# Patient Record
Sex: Female | Born: 1965 | Race: White | Hispanic: Yes | Smoking: Never smoker
Health system: Southern US, Community
[De-identification: ages and names within clinical notes are randomized; demographics above are authoritative.]

## PROBLEM LIST (undated history)

## (undated) DIAGNOSIS — R51 Headache: Secondary | ICD-10-CM

## (undated) DIAGNOSIS — T8859XA Other complications of anesthesia, initial encounter: Secondary | ICD-10-CM

## (undated) DIAGNOSIS — E7800 Pure hypercholesterolemia, unspecified: Secondary | ICD-10-CM

## (undated) DIAGNOSIS — T4145XA Adverse effect of unspecified anesthetic, initial encounter: Secondary | ICD-10-CM

## (undated) DIAGNOSIS — G20A1 Parkinson's disease without dyskinesia, without mention of fluctuations: Secondary | ICD-10-CM

## (undated) DIAGNOSIS — R519 Headache, unspecified: Secondary | ICD-10-CM

## (undated) HISTORY — DX: Headache: R51

## (undated) HISTORY — DX: Headache, unspecified: R51.9

## (undated) HISTORY — DX: Other complications of anesthesia, initial encounter: T88.59XA

---
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
## 1999-01-24 ENCOUNTER — Encounter: Payer: Self-pay | Admitting: *Deleted

## 1999-01-24 ENCOUNTER — Ambulatory Visit (HOSPITAL_COMMUNITY): Admission: RE | Admit: 1999-01-24 | Discharge: 1999-01-24 | Payer: Self-pay | Admitting: *Deleted

## 1999-04-26 ENCOUNTER — Inpatient Hospital Stay (HOSPITAL_COMMUNITY): Admission: AD | Admit: 1999-04-26 | Discharge: 1999-04-26 | Payer: Self-pay | Admitting: *Deleted

## 1999-04-26 ENCOUNTER — Encounter: Payer: Self-pay | Admitting: *Deleted

## 1999-04-29 ENCOUNTER — Inpatient Hospital Stay (HOSPITAL_COMMUNITY): Admission: AD | Admit: 1999-04-29 | Discharge: 1999-04-29 | Payer: Self-pay | Admitting: *Deleted

## 1999-04-30 ENCOUNTER — Encounter (INDEPENDENT_AMBULATORY_CARE_PROVIDER_SITE_OTHER): Payer: Self-pay

## 1999-04-30 ENCOUNTER — Inpatient Hospital Stay (HOSPITAL_COMMUNITY): Admission: AD | Admit: 1999-04-30 | Discharge: 1999-05-03 | Payer: Self-pay | Admitting: Obstetrics & Gynecology

## 1999-05-05 ENCOUNTER — Inpatient Hospital Stay (HOSPITAL_COMMUNITY): Admission: AD | Admit: 1999-05-05 | Discharge: 1999-05-05 | Payer: Self-pay | Admitting: Obstetrics & Gynecology

## 1999-05-08 ENCOUNTER — Inpatient Hospital Stay (HOSPITAL_COMMUNITY): Admission: AD | Admit: 1999-05-08 | Discharge: 1999-05-08 | Payer: Self-pay | Admitting: *Deleted

## 1999-05-09 ENCOUNTER — Inpatient Hospital Stay (HOSPITAL_COMMUNITY): Admission: AD | Admit: 1999-05-09 | Discharge: 1999-05-09 | Payer: Self-pay | Admitting: Obstetrics and Gynecology

## 1999-05-11 ENCOUNTER — Inpatient Hospital Stay (HOSPITAL_COMMUNITY): Admission: AD | Admit: 1999-05-11 | Discharge: 1999-05-11 | Payer: Self-pay | Admitting: *Deleted

## 1999-05-14 ENCOUNTER — Inpatient Hospital Stay (HOSPITAL_COMMUNITY): Admission: AD | Admit: 1999-05-14 | Discharge: 1999-05-14 | Payer: Self-pay | Admitting: Obstetrics & Gynecology

## 2002-10-14 ENCOUNTER — Ambulatory Visit (HOSPITAL_COMMUNITY): Admission: RE | Admit: 2002-10-14 | Discharge: 2002-10-14 | Payer: Self-pay | Admitting: Family Medicine

## 2008-05-13 DIAGNOSIS — E7800 Pure hypercholesterolemia, unspecified: Secondary | ICD-10-CM | POA: Insufficient documentation

## 2009-03-13 LAB — CONVERTED CEMR LAB: Pap Smear: NORMAL

## 2009-04-12 ENCOUNTER — Ambulatory Visit (HOSPITAL_COMMUNITY): Admission: RE | Admit: 2009-04-12 | Discharge: 2009-04-12 | Payer: Self-pay | Admitting: Obstetrics & Gynecology

## 2010-02-15 ENCOUNTER — Observation Stay (HOSPITAL_COMMUNITY): Admission: EM | Admit: 2010-02-15 | Discharge: 2010-02-17 | Payer: Self-pay | Admitting: Emergency Medicine

## 2010-02-15 ENCOUNTER — Ambulatory Visit: Payer: Self-pay | Admitting: Cardiology

## 2010-02-15 DIAGNOSIS — R079 Chest pain, unspecified: Secondary | ICD-10-CM | POA: Insufficient documentation

## 2010-02-16 LAB — CONVERTED CEMR LAB
ALT: 15 units/L
AST: 18 units/L
Albumin: 3.4 g/dL
CO2: 24 meq/L
Calcium: 8.7 mg/dL
Creatinine, Ser: 0.56 mg/dL
Sodium: 136 meq/L
Total Protein: 6.3 g/dL

## 2010-02-17 LAB — CONVERTED CEMR LAB
MCHC: 33.3 g/dL
MCV: 93 fL
Platelets: 257 10*3/uL
RDW: 13.4 %

## 2010-02-27 ENCOUNTER — Ambulatory Visit: Payer: Self-pay | Admitting: Nurse Practitioner

## 2010-02-27 DIAGNOSIS — R42 Dizziness and giddiness: Secondary | ICD-10-CM

## 2010-02-27 DIAGNOSIS — F411 Generalized anxiety disorder: Secondary | ICD-10-CM | POA: Insufficient documentation

## 2010-02-27 DIAGNOSIS — M25539 Pain in unspecified wrist: Secondary | ICD-10-CM | POA: Insufficient documentation

## 2010-02-27 DIAGNOSIS — K219 Gastro-esophageal reflux disease without esophagitis: Secondary | ICD-10-CM

## 2010-03-21 ENCOUNTER — Ambulatory Visit: Payer: Self-pay | Admitting: Nurse Practitioner

## 2010-03-21 DIAGNOSIS — R519 Headache, unspecified: Secondary | ICD-10-CM | POA: Insufficient documentation

## 2010-03-21 DIAGNOSIS — R51 Headache: Secondary | ICD-10-CM

## 2010-03-21 LAB — CONVERTED CEMR LAB
Cholesterol, target level: 200 mg/dL
LDL Goal: 160 mg/dL

## 2010-03-27 ENCOUNTER — Ambulatory Visit: Payer: Self-pay | Admitting: Nurse Practitioner

## 2010-03-27 LAB — CONVERTED CEMR LAB
ALT: 20 units/L (ref 0–35)
AST: 19 units/L (ref 0–37)
Albumin: 4.3 g/dL (ref 3.5–5.2)
Alkaline Phosphatase: 43 units/L (ref 39–117)
BUN: 9 mg/dL (ref 6–23)
Basophils Absolute: 0 10*3/uL (ref 0.0–0.1)
Basophils Relative: 0 % (ref 0–1)
Calcium: 9.2 mg/dL (ref 8.4–10.5)
Chloride: 103 meq/L (ref 96–112)
Creatinine, Ser: 0.55 mg/dL (ref 0.40–1.20)
Eosinophils Absolute: 0.2 10*3/uL (ref 0.0–0.7)
HDL: 59 mg/dL (ref >39)
LDL Cholesterol: 131 mg/dL — ABNORMAL HIGH (ref 0–99)
MCHC: 33.6 g/dL (ref 30.0–36.0)
MCV: 91.4 fL (ref 78.0–100.0)
Monocytes Relative: 6 % (ref 3–12)
Neutro Abs: 4.2 10*3/uL (ref 1.7–7.7)
Neutrophils Relative %: 54 % (ref 43–77)
Potassium: 4.1 meq/L (ref 3.5–5.3)
RDW: 13.8 % (ref 11.5–15.5)
TSH: 1.133 microintl units/mL (ref 0.350–4.500)
Total CHOL/HDL Ratio: 3.5

## 2010-03-28 ENCOUNTER — Encounter (INDEPENDENT_AMBULATORY_CARE_PROVIDER_SITE_OTHER): Payer: Self-pay | Admitting: Nurse Practitioner

## 2010-05-30 ENCOUNTER — Other Ambulatory Visit: Payer: Self-pay | Admitting: Nurse Practitioner

## 2010-05-30 ENCOUNTER — Encounter (INDEPENDENT_AMBULATORY_CARE_PROVIDER_SITE_OTHER): Payer: Self-pay | Admitting: Nurse Practitioner

## 2010-05-30 ENCOUNTER — Ambulatory Visit
Admission: RE | Admit: 2010-05-30 | Discharge: 2010-05-30 | Payer: Self-pay | Source: Home / Self Care | Attending: Nurse Practitioner | Admitting: Nurse Practitioner

## 2010-05-30 LAB — CONVERTED CEMR LAB
Blood in Urine, dipstick: NEGATIVE
GC Probe Amp, Genital: NEGATIVE
Glucose, Urine, Semiquant: NEGATIVE
Nitrite: NEGATIVE
Protein, U semiquant: NEGATIVE
Urobilinogen, UA: 0.2
WBC Urine, dipstick: NEGATIVE

## 2010-05-31 ENCOUNTER — Encounter (INDEPENDENT_AMBULATORY_CARE_PROVIDER_SITE_OTHER): Payer: Self-pay | Admitting: Nurse Practitioner

## 2010-06-01 ENCOUNTER — Ambulatory Visit (HOSPITAL_COMMUNITY)
Admission: RE | Admit: 2010-06-01 | Discharge: 2010-06-01 | Payer: Self-pay | Source: Home / Self Care | Attending: Internal Medicine | Admitting: Internal Medicine

## 2010-06-12 NOTE — Assessment & Plan Note (Signed)
Summary: NEW - Hospital F/u   Vital Signs:  Patient profile:   45 year old female LMP:     01/31/2010 Height:      59 inches Weight:      139.7 pounds BMI:     28.32 Temp:     97.4 degrees F oral Pulse rate:   64 / minute Pulse rhythm:   regular Resp:     16 per minute BP sitting:   106 / 76  (left arm) Cuff size:   regular  Vitals Entered By: Levon Hedger (February 27, 2010 9:38 AM)  Nutrition Counseling: Patient's BMI is greater than 25 and therefore counseled on weight management options. CC: hospital follow up [facility], Abdominal Pain Is Patient Diabetic? No Pain Assessment Patient in pain? no       Does patient need assistance? Functional Status Self care Ambulation Normal LMP (date): 01/31/2010     Enter LMP: 01/31/2010 Last PAP Result normal per pt - done at [facility]   CC:  hospital follow up [facility] and Abdominal Pain.  History of Present Illness:  Pt into the office to establish care. No previous PCP.  Pt was seen at a clinic about 1 year ago at which time she was dx with hypercholesterolemia. She was started on fish oil and niacin of which she is still taking.  She purchases the medications over the counter.  CPE - last PAP was 03/2009 done at [facility]  Hospitalized from on 02/15/2010 with chest pain.  Pain started while she was on the stairmaster at the gym.  Pain had been present intermittently for the past 5-6 months but worsened on the day of presentation.   Pt started going to the gym about 2 months.  She had done the stairmaster on the day prior but only for 1 minutes.  On the day of presentation she wanted to do more and after 2 minutes is when she felt the chest pain.  Dizziness and weakness started EKG was not suggestive of ischemia Cardiac enzymes negative Pt was started on heparin, aspirin and satin Lexiscan Myoview done in the hospital -  No inducible ischemia with exercise stress   71% ejection fraction   Hyperlipidemia - lipids 121. Pt  was advised to follow diet, exercise and TLC diet  Acid reflux -tobacco -ETOH +spicy foods +tomato based   [employer] used for interpreter  Dyspepsia History:      She has no alarm features of dyspepsia including no history of melena, hematochezia, dysphagia, persistent vomiting, or involuntary weight loss > 5%.  The patient does not have a prior history of documented ulcer disease.  The dominant symptom is heartburn or acid reflux.     Habits & Providers  Alcohol-Tobacco-Diet     Alcohol drinks/day: 0     Tobacco Status: never  Exercise-Depression-Behavior     Drug Use: never  Medications Prior to Update: 1)  None  Current Medications (verified): 1)  Fish Oil 1000 Mg Caps (Omega-3 Fatty Acids) .... Take One Tablet By Mouth Daily  Allergies (verified): No Known Drug Allergies  Past History:  Past Surgical History: Caesarean section x 3  Family History:  mother - noncontributory father - noncontributory  Social History: married 3 children tobacco - none ETOH - none Drug - noneSmoking Status:  never Drug Use:  never  Review of Systems General:  Denies fever; +Dizziness. Eyes:  Complains of blurring; worsening vision for the past year. CV:  Complains of chest pain or  discomfort; when she goes to the gym and does sit ups or walks on stairmaster. Resp:  Denies cough. GI:  Complains of indigestion; denies abdominal pain, nausea, and vomiting. Neuro:  Complains of numbness; right wrist.  Physical Exam  General:  alert.   Head:  normocephalic.   Lungs:  normal breath sounds.   Heart:  normal rate and regular rhythm.   Abdomen:  normal bowel sounds.   Msk:  normal ROM.   Neurologic:  alert & oriented X3.   Skin:  color normal.   Psych:  Oriented X3.     Impression & Recommendations:  Problem # 1:  Hosp for CHEST PAIN (ICD-786.50) likely muscle advise pt that she needs  Problem # 2:  DIZZINESS (ICD-780.4) advise pt that she need to drink  plenty of water cbc done in hospital ok  Problem # 3:  HYPERCHOLESTEROLEMIA (ICD-272.0) will check on next visit  Problem # 4:  WRIST PAIN, RIGHT (ICD-719.43)  Problem # 5:  ACID REFLUX DISEASE (ICD-530.81)  handout given on foods to avoid  Her updated medication list for this problem includes:    Nexium 40 Mg Cpdr (Esomeprazole magnesium) ..... One capsule by mouth daily before breakfast  Problem # 6:  NEED PROPHYLACTIC VACCINATION&INOCULATION FLU (ICD-V04.81) given today  Complete Medication List: 1)  Fish Oil 1000 Mg Caps (Omega-3 fatty acids) .... Take one tablet by mouth daily 2)  Nexium 40 Mg Cpdr (Esomeprazole magnesium) .... One capsule by mouth daily before breakfast  Patient Instructions: 1)  Schedule an appointment for retasure at [facility] 2)  This will check your eye for cataracts or glaucoma 3)  If this is ok then you will likely need to get a basic eye exam 4)  Follow up with n.martin,fnp in 2 weeks for chest pain. 5)  Acid reflux - read handout to see what foods to avoid 6)  Take nexium 40mg  by mouth daily before breakfast (samples given) 7)  Chest pain - not cardiac 8)  may be either muscle or due to acid reflux 9)  Right wrist - likely due to over use of wrist especially since you are right handed.  Avoid repeative activities with your right hand.  Avoid sleeping with your wrist bend under your pillow or head at night. 10)  You have received the flu vaccine today. Prescriptions: NEXIUM 40 MG CPDR (ESOMEPRAZOLE MAGNESIUM) One capsule by mouth daily before breakfast  #10 x 0   Entered and Authorized by:   Lehman Prom FNP   Signed by:   Lehman Prom FNP on 02/27/2010   Method used:   Samples Given   RxID:   [id number]    Orders Added: 1)  New Patient Level III [99203]     Stress Echocardiogram  Procedure date:  02/17/2010  Findings:       No inducible ischemia with exercise stress   71% ejection fraction    Stress  Echocardiogram  Procedure date:  02/17/2010  Findings:       No inducible ischemia with exercise stress   71% ejection fraction    Prevention & Chronic Care Immunizations   Influenza vaccine: Not documented    Tetanus booster: Not documented    Pneumococcal vaccine: Not documented  Other Screening   Pap smear: normal per pt - done at [facility]  (03/13/2009)    Mammogram: Not documented   Smoking status: never  (02/27/2010)  Lipids   Total Cholesterol: Not documented   LDL: Not documented  LDL Direct: Not documented   HDL: Not documented   Triglycerides: Not documented    SGOT (AST): 18  (02/16/2010)   SGPT (ALT): 15  (02/16/2010)   Alkaline phosphatase: Not documented   Total bilirubin: Not documented  Self-Management Support :    Lipid self-management support: Not documented    Nursing Instructions: Give Flu vaccine today    Appended Document: NEW - Hospital F/u     Allergies: No Known Drug Allergies   Complete Medication List: 1)  Fish Oil 1000 Mg Caps (Omega-3 fatty acids) .... Take one tablet by mouth daily 2)  Nexium 40 Mg Cpdr (Esomeprazole magnesium) .... One capsule by mouth daily before breakfast  Other Orders: Flu Vaccine 100yrs + (40102) Admin 1st Vaccine (72536)   Orders Added: 1)  Flu Vaccine 17yrs + [64403] 2)  Admin 1st Vaccine [47425]   Immunizations Administered:  Influenza Vaccine # 1:    Vaccine Type: Fluvax 3+    Site: right deltoid    Mfr: GlaxoSmithKline    Dose: 0.5 ml    Route: IM    Given by: Levon Hedger    Exp. Date: 11/10/2010    Lot #: [id number]    VIS given: 12/05/09 version given February 27, 2010.  Flu Vaccine Consent Questions:    Do you have a history of severe allergic reactions to this vaccine? no    Any prior history of allergic reactions to egg and/or gelatin? no    Do you have a sensitivity to the preservative Thimersol? no    Do you have a past history of Guillan-Barre Syndrome? no    Do you  currently have an acute febrile illness? no    Have you ever had a severe reaction to latex? no    Vaccine information given and explained to patient? yes    Are you currently pregnant? no   ndc  [phone]  Immunizations Administered:  Influenza Vaccine # 1:    Vaccine Type: Fluvax 3+    Site: right deltoid    Mfr: GlaxoSmithKline    Dose: 0.5 ml    Route: IM    Given by: Levon Hedger    Exp. Date: 11/10/2010    Lot #: [id number]    VIS given: 12/05/09 version given February 27, 2010.

## 2010-06-12 NOTE — Letter (Signed)
Summary: Lipid Letter  [facility]  [address] [place], [place] [address]   Phone: [phone]  Fax: [phone]    03/28/2010  [facility] [address] [place], [place]  [address]  Dear Huntley Dec:  We have carefully reviewed your last lipid profile from 03/27/2010 and the results are noted below with a summary of recommendations for lipid management.    Cholesterol:       208     Goal: less than 200   HDL "good" Cholesterol:   59     Goal: greater than 40   LDL "bad" Cholesterol:   131     Goal: less than 130   Triglycerides:       90     Goal: less than 150    Labs done during recent office visit shows that your cholesterol is slightly elevated.  No need for medications at this time.       Current Medications: 1)    Fish Oil 1000 Mg Caps (Omega-3 fatty acids) .... Take one tablet by mouth daily 2)    Citalopram Hydrobromide 20 Mg Tabs (Citalopram hydrobromide) .... One tablet by mouth daily for anxiety 3)    Cyclobenzaprine Hcl 5 Mg Tabs (Cyclobenzaprine hcl) .... One tablet by mouth nightly as needed for headache  If you have any questions, please call. We appreciate being able to work with you.   Sincerely,    Lehman Prom, FNP [facility]

## 2010-06-12 NOTE — Assessment & Plan Note (Signed)
Summary: 2 week F/U - Anxiety    Vital Signs:  Patient profile:   45 year old female Weight:      138.6 pounds BMI:     28.09 Temp:     97.4 degrees F oral Pulse rate:   64 / minute Pulse rhythm:   regular Resp:     16 per minute BP sitting:   100 / 56  (left arm) Cuff size:   regular  Vitals Entered By: Levon Hedger (March 21, 2010 3:54 PM)  Nutrition Counseling: Patient's BMI is greater than 25 and therefore counseled on weight management options. CC: follow-up visit2 weeks...having frequent headaches, Lipid Management, Headache Is Patient Diabetic? No Pain Assessment Patient in pain? yes     Location: headaches  Does patient need assistance? Functional Status Self care Ambulation Normal   CC:  follow-up visit2 weeks...having frequent headaches, Lipid Management, and Headache.  History of Present Illness:  Pt into the office for 2 week f/u on chest pain. Dx with acid reflux and was started on nexium Pt reports that she finished taking the medication 3 days ago. She has not noticed any change in the pain during the days when she has not taken the meds Pt s/p hospitalization prior to her last visit and was ruled out for cardiac problems She has tried to decrease spicy foods in her diet.  Optho - pt is still having problems with her eyes.  She did not get an appt for her eye as ordered  [employer] use for interpreter  Social - pt reports that her 44 year old has left the house and she is concerned  Headache HPI:      She has approximately 5+ headaches per month.  There is no family history of migraine headaches.        The headaches are not associated with an aura.  The location of the headaches are occipital.  Headache quality is pressure or tightness.  The headaches are associated with photophobia.        The patient denies scalp tenderness and seizures.        Additional history: When pt goes to sleep she sometimes wakes with the headache.    Lipid  Management History:      Negative NCEP/ATP III risk factors include female age less than 26 years old and non-tobacco-user status.        Comments include: Pt is not taking any medications. She would like to get her cholesterol checked.      Habits & Providers  Alcohol-Tobacco-Diet     Alcohol drinks/day: 0     Tobacco Status: never  Exercise-Depression-Behavior     Does Patient Exercise: yes     Drug Use: never  Allergies (verified): No Known Drug Allergies  Social History: Does Patient Exercise:  yes  Review of Systems General:  Denies fever. Eyes:  Complains of blurring. CV:  Denies chest pain or discomfort. Resp:  Denies cough and coughing up blood. GI:  Denies abdominal pain, nausea, and vomiting. Neuro:  Complains of headaches.  Physical Exam  General:  alert.   Head:  normocephalic.   Lungs:  normal breath sounds.   Heart:  normal rate and regular rhythm.   Abdomen:  normal bowel sounds.   Msk:  normal ROM.   Neurologic:  alert & oriented X3.   Skin:  color normal.   Psych:  Oriented X3.     Impression & Recommendations:  Problem # 1:  ANXIETY  STATE, UNSPECIFIED (ICD-300.00) handout given advised pt to take meds read handout  Her updated medication list for this problem includes:    Citalopram Hydrobromide 20 Mg Tabs (Citalopram hydrobromide) ..... One tablet by mouth daily for anxiety  Problem # 2:  HEADACHE (ICD-784.0) advised this may be due to tension  Complete Medication List: 1)  Fish Oil 1000 Mg Caps (Omega-3 fatty acids) .... Take one tablet by mouth daily 2)  Citalopram Hydrobromide 20 Mg Tabs (Citalopram hydrobromide) .... One tablet by mouth daily for anxiety 3)  Cyclobenzaprine Hcl 5 Mg Tabs (Cyclobenzaprine hcl) .... One tablet by mouth nightly as needed for headache  Lipid Assessment/Plan:      Based on NCEP/ATP III, the patient's risk factor category is "0-1 risk factors".  The patient's lipid goals are as follows: Total cholesterol  goal is 200; LDL cholesterol goal is 160; HDL cholesterol goal is 40; Triglyceride goal is 150.    Patient Instructions: 1)  Schedule a lab appointment for fasting labs - lipds, cmet, cbc, tsh, rapid hiv 2)  no food after midnight before this visit 3)  You must be having panic attacks or tension headaches. 4)  Read the handout. 5)  This can cause lots of symptoms such as chest pain, headaches. 6)  Try to go to free eye exam this week 7)  Schedule an appointment for a complete physical exam. 8)  You will need PAP, EKG, mammogram, u/a. Prescriptions: CYCLOBENZAPRINE HCL 5 MG TABS (CYCLOBENZAPRINE HCL) One tablet by mouth nightly as needed for headache  #20 x 0   Entered and Authorized by:   Lehman Prom FNP   Signed by:   Lehman Prom FNP on 03/21/2010   Method used:   Print then Give to Patient   RxID:   [id number] CITALOPRAM HYDROBROMIDE 20 MG TABS (CITALOPRAM HYDROBROMIDE) One tablet by mouth daily for anxiety  #30 x 1   Entered and Authorized by:   Lehman Prom FNP   Signed by:   Lehman Prom FNP on 03/21/2010   Method used:   Print then Give to Patient   RxID:   [id number]    Orders Added: 1)  Est. Patient Level III [84696]

## 2010-06-12 NOTE — Letter (Signed)
Summary: Handout Printed  Printed Handout:  - Anxiety and Panic Attacks 

## 2010-06-14 NOTE — Progress Notes (Signed)
Summary: Office Visit//DEPRESSION SCREENING  Office Visit//DEPRESSION SCREENING   Imported By: Arta Bruce 06/01/2010 15:31:04  _____________________________________________________________________  External Attachment:    Type:   Image     Comment:   External Document

## 2010-06-14 NOTE — Letter (Signed)
Summary: *HSN Results Follow up  [facility]  [address] [place], [place] [address]   Phone: [phone]  Fax: [phone]      05/31/2010   [facility] [address] [place], [place]  [address]   Dear  Ms. Sylvan Kedzierski,                            ____S.Drinkard,FNP   ____D. Gore,FNP       ____B. McPherson,MD   ____V. Rankins,MD    ____E. Mulberry,MD    _X___N. Daphine Deutscher, FNP  ____D. Reche Dixon, MD    ____K. Philipp Deputy, MD    ____Other     This letter is to inform you that your recent test(s):  ___X____Pap Smear    _______Lab Test     _______X-ray    ___X____ is within acceptable limits  _______ requires a medication change  _______ requires a follow-up lab visit  _______ requires a follow-up visit with your provider   Comments:  Pap Smear results are normal.       _________________________________________________________ If you have any questions, please contact our office [phone].                    Sincerely,    Lehman Prom FNP [facility]

## 2010-06-14 NOTE — Assessment & Plan Note (Signed)
Summary: Complete Physical Exam   Vital Signs:  Patient profile:   45 year old female Menstrual status:  regular LMP:     05/04/2010 Weight:      139.25 pounds Temp:     97.8 degrees F oral Pulse rate:   60 / minute Pulse rhythm:   regular Resp:     16 per minute BP sitting:   96 / 62  (left arm) Cuff size:   regular  Vitals Entered By: Hale Drone CMA (May 30, 2010 8:39 AM) CC: cpp...., Depression, Abdominal Pain  Does patient need assistance? Functional Status Self care Ambulation Normal  Vision Screening:Left eye w/o correction: 20 / 25 Right Eye w/o correction: 20 / 25 Both eyes w/o correction:  20/ 20        Vision Entered By: Hale Drone CMA (May 30, 2010 8:47 AM) LMP (date): 05/04/2010 LMP - Character: normal    Menses interval (days): 28 Menstrual flow (days): 5 Menstrual Status regular Enter LMP: 05/04/2010 Last PAP Result normal per pt - done at [facility]   CC:  cpp...., Depression, and Abdominal Pain.  History of Present Illness:  Pt into the office for a complete physical exam  PAP - last done at [facility] 1 year ago.  All previous normal  No family history of ovarian or cervical cancer  Mammogram - Last mammogram done 1 year ago. Self breast exam at times at home but not consistant  Married with 3 children  Optho - last eye exam was September 2011.  She was ordered glasses.  Dental - No recent dental exam  Comments:  Pt took celexa for 1 month and did not get the refill.  she was unaware of the refill protocal.  Dyspepsia History:      She has no alarm features of dyspepsia including no history of melena, hematochezia, dysphagia, persistent vomiting, or involuntary weight loss > 5%.  There is a prior history of GERD.  The patient does not have a prior history of documented ulcer disease.  The dominant symptom is not heartburn or acid reflux.  An H-2 blocker medication is not currently being taken.     Habits &  Providers  Alcohol-Tobacco-Diet     Alcohol drinks/day: 0     Tobacco Status: never  Exercise-Depression-Behavior     Does Patient Exercise: yes     Have you felt down or hopeless? no     Have you felt little pleasure in things? no     Depression Counseling: not indicated; screening negative for depression     Drug Use: never  Comments: PHQ-9 score = 15  Current Medications (verified): 1)  Fish Oil 1000 Mg Caps (Omega-3 Fatty Acids) .... Take One Tablet By Mouth Daily 2)  Citalopram Hydrobromide 20 Mg Tabs (Citalopram Hydrobromide) .... One Tablet By Mouth Daily For Anxiety 3)  Cyclobenzaprine Hcl 5 Mg Tabs (Cyclobenzaprine Hcl) .... One Tablet By Mouth Nightly As Needed For Headache  Allergies: No Known Drug Allergies  Review of Systems General:  Denies fever. Eyes:  Denies blurring. ENT:  Denies earache. CV:  Denies fatigue. Resp:  Denies cough. GI:  Complains of gas; denies abdominal pain. GU:  Denies discharge. MS:  Denies joint pain. Derm:  Denies dryness. Neuro:  Denies headaches. Psych:  Complains of anxiety; Pt only took the celexa for 1 month. she tolerated well but did not know that she had refills.  . Endo:  Denies excessive urination.  Physical Exam  General:  alert.  Head:  normocephalic.   Eyes:  pupils round.   Ears:  bil TM with bony landmarks present Nose:  no nasal discharge.   Mouth:  pharynx pink and moist.   Neck:  supple.   Chest Wall:  no mass.   Breasts:  no masses and no abnormal thickening.   Lungs:  normal breath sounds.   Heart:  normal rate and regular rhythm.   Abdomen:  normal bowel sounds.   Rectal:  no external abnormalities.   Msk:  normal ROM.   Pulses:  R radial normal and L radial normal.   Extremities:  no edema Neurologic:  alert & oriented X3 and gait normal.   Skin:  color normal.   Psych:  Oriented X3.    Pelvic Exam  Vulva:      normal appearance.   Urethra and Bladder:      Urethra--no discharge.    Vagina:      physiologic discharge.   Cervix:      midposition.   Uterus:      smooth.   Adnexa:      nontender bilaterally.   Rectum:      normal, heme negative stool.      Impression & Recommendations:  Problem # 1:  ROUTINE GYNECOLOGICAL EXAMINATION (ICD-V72.31) PAP done rec dental exam labs up to date EKG done Orders: Vision Screening (29937) UA Dipstick w/o Micro (manual) (81002) KOH/ WET Mount [phone]) Pap Smear, Thin Prep ( Collection of) (Q0091) T- GC Chlamydia (89381) EKG w/ Interpretation (93000) Hemoccult Guaiac-1 spec.(in office) (82270)  Problem # 2:  UNSPECIFIED BREAST SCREENING (ICD-V76.10) Assessment: Unchanged self breast exam placcard given to pt  mammogram scheduled Orders: Mammogram (Screening) (Mammo)  Problem # 3:  ACID REFLUX DISEASE (ICD-530.81) stable  Problem # 4:  ANXIETY STATE, UNSPECIFIED (ICD-300.00) pt instructed to restart celexa she has 1 more refill on this medication PHQ-9 score = 15 Her updated medication list for this problem includes:    Citalopram Hydrobromide 20 Mg Tabs (Citalopram hydrobromide) ..... One tablet by mouth daily for anxiety  Complete Medication List: 1)  Fish Oil 1000 Mg Caps (Omega-3 fatty acids) .... Take one tablet by mouth daily 2)  Citalopram Hydrobromide 20 Mg Tabs (Citalopram hydrobromide) .... One tablet by mouth daily for anxiety 3)  Cyclobenzaprine Hcl 5 Mg Tabs (Cyclobenzaprine hcl) .... One tablet by mouth nightly as needed for headache   Patient Instructions: 1)  You should get the refill on your medication.  Go to the pharmacy. 2)  When you need additional refills you can call this office . 3)  You will be notified the results of your pap smear 4)  Follow up as needed 5)  Will need tdap on next visit (none in office today)     Orders Added: 1)  Est. Patient age 90-64 [48] 2)  Mammogram (Screening) [Mammo] 3)  Vision Screening [phone] 4)  UA Dipstick w/o Micro (manual) [81002] 5)   KOH/ WET Mount [87210] 6)  Pap Smear, Thin Prep ( Collection of) [Q0091] 7)  T- GC Chlamydia [02585] 8)  EKG w/ Interpretation [93000] 9)  Hemoccult Guaiac-1 spec.(in office) [82270]    Prevention & Chronic Care Immunizations   Influenza vaccine: Fluvax 3+  (02/27/2010)    Tetanus booster: Not documented   Td booster deferral: Not available  (05/30/2010)    Pneumococcal vaccine: Not documented  Other Screening   Pap smear: normal per pt - done at [facility]  (03/13/2009)    Mammogram:  Not documented   Smoking status: never  (05/30/2010)  Lipids   Total Cholesterol: 208  (03/27/2010)   LDL: 131  (03/27/2010)   LDL Direct: Not documented   HDL: 59  (03/27/2010)   Triglycerides: 90  (03/27/2010)    SGOT (AST): 19  (03/27/2010)   SGPT (ALT): 20  (03/27/2010)   Alkaline phosphatase: 43  (03/27/2010)   Total bilirubin: 0.6  (03/27/2010)  Self-Management Support :    Lipid self-management support: Not documented     EKG  Procedure date:  05/30/2010  Findings:      NSR   Laboratory Results   Urine Tests  Date/Time Received: May 30, 2010 9:14 AM   Routine Urinalysis   Color: lt. yellow Glucose: negative   (Normal Range: Negative) Bilirubin: negative   (Normal Range: Negative) Ketone: negative   (Normal Range: Negative) Spec. Gravity: >=1.030   (Normal Range: 1.003-1.035) Blood: negative   (Normal Range: Negative) pH: 6.0   (Normal Range: 5.0-8.0) Protein: negative   (Normal Range: Negative) Urobilinogen: 0.2   (Normal Range: 0-1) Nitrite: negative   (Normal Range: Negative) Leukocyte Esterace: negative   (Normal Range: Negative)    Date/Time Received: May 30, 2010 9:35 AM   Wet Mount Source: vaginal WBC/hpf: 1-5 Bacteria/hpf: rare Clue cells/hpf: none Yeast/hpf: none Wet Mount KOH: Negative Trichomonas/hpf: none  Stool - Occult Blood Hemmoccult #1: negative Date: 05/30/2010

## 2010-07-05 ENCOUNTER — Encounter: Payer: Self-pay | Admitting: Nurse Practitioner

## 2010-07-05 ENCOUNTER — Encounter (INDEPENDENT_AMBULATORY_CARE_PROVIDER_SITE_OTHER): Payer: Self-pay | Admitting: Nurse Practitioner

## 2010-07-05 DIAGNOSIS — K029 Dental caries, unspecified: Secondary | ICD-10-CM | POA: Insufficient documentation

## 2010-07-10 NOTE — Assessment & Plan Note (Signed)
Summary: Dental Referral   Vital Signs:  Patient profile:   45 year old female Menstrual status:  regular Weight:      142.0 pounds BMI:     28.78 Temp:     97.9 degrees F oral Pulse rate:   72 / minute Pulse rhythm:   regular Resp:     16 per minute BP sitting:   100 / 60  (left arm) Cuff size:   regular  Vitals Entered By: Levon Hedger (July 05, 2010 3:42 PM)  Nutrition Counseling: Patient's BMI is greater than 25 and therefore counseled on weight management options. CC: dental referral Is Patient Diabetic? No Pain Assessment Patient in pain? no       Does patient need assistance? Functional Status Self care Ambulation Normal   CC:  dental referral.  History of Present Illness:  Pt into the office today for a dental referral. Pt would like her teeth cleaned.  She also has a tooth which has a small cavity. No acute problem today, just wanted info about referral. -swelling -pain in tooth   Allergies (verified): No Known Drug Allergies  Review of Systems General:  Denies fever. CV:  Denies chest pain or discomfort. Resp:  Denies cough. GI:  Denies abdominal pain, nausea, and vomiting.  Physical Exam  General:  alert.   Head:  normocephalic.   Msk:  normal ROM.   Neurologic:  alert & oriented X3.   Skin:  no rashes.   Psych:  Oriented X3.     Impression & Recommendations:  Problem # 1:  DENTAL CARIES (ICD-521.00) pt given information about the [facility] dental hygiene clinic will refer to dental clinic - advised pt they will not clean but may extract teeth only Orders: Dental Referral (Dentist)  Complete Medication List: 1)  Fish Oil 1000 Mg Caps (Omega-3 fatty acids) .... Take one tablet by mouth daily 2)  Citalopram Hydrobromide 20 Mg Tabs (Citalopram hydrobromide) .... One tablet by mouth daily for anxiety 3)  Cyclobenzaprine Hcl 5 Mg Tabs (Cyclobenzaprine hcl) .... One tablet by mouth nightly as needed for headache  Patient  Instructions: 1)  Call Dental Hygiene Clinic at [facility] to get your teeth cleaned. 2)  You will be put on the list for the dental clinic and they will call you with the time/date of the appointment 3)  Follow up as needed   Orders Added: 1)  Est. Patient Level III [16109] 2)  Dental Referral [Dentist]

## 2010-07-10 NOTE — Letter (Signed)
Summary: DENTAL REFERRAL  DENTAL REFERRAL   Imported By: Arta Bruce 07/06/2010 10:49:59  _____________________________________________________________________  External Attachment:    Type:   Image     Comment:   External Document

## 2010-07-26 LAB — POCT I-STAT, CHEM 8
BUN: 10 mg/dL (ref 6–23)
Chloride: 106 mEq/L (ref 96–112)
Glucose, Bld: 97 mg/dL (ref 70–99)
HCT: 37 % (ref 36.0–46.0)
Potassium: 3.9 mEq/L (ref 3.5–5.1)

## 2010-07-26 LAB — CBC
HCT: 35 % — ABNORMAL LOW (ref 36.0–46.0)
Hemoglobin: 11.7 g/dL — ABNORMAL LOW (ref 12.0–15.0)
MCH: 30.6 pg (ref 26.0–34.0)
MCH: 30.8 pg (ref 26.0–34.0)
MCHC: 33.2 g/dL (ref 30.0–36.0)
MCHC: 33.8 g/dL (ref 30.0–36.0)
MCV: 92.1 fL (ref 78.0–100.0)
MCV: 93 fL (ref 78.0–100.0)
Platelets: 257 10*3/uL (ref 150–400)
Platelets: 277 10*3/uL (ref 150–400)
RBC: 3.8 MIL/uL — ABNORMAL LOW (ref 3.87–5.11)
RDW: 13.2 % (ref 11.5–15.5)
RDW: 13.4 % (ref 11.5–15.5)
RDW: 13.5 % (ref 11.5–15.5)
WBC: 6.5 10*3/uL (ref 4.0–10.5)

## 2010-07-26 LAB — CK TOTAL AND CKMB (NOT AT ARMC)
CK, MB: 1.3 ng/mL (ref 0.3–4.0)
Total CK: 88 U/L (ref 7–177)

## 2010-07-26 LAB — PROTIME-INR: INR: 0.96 (ref 0.00–1.49)

## 2010-07-26 LAB — LIPID PANEL
HDL: 61 mg/dL (ref >39)
LDL Cholesterol: 121 mg/dL — ABNORMAL HIGH (ref 0–99)
Total CHOL/HDL Ratio: 3.2 RATIO
Triglycerides: 70 mg/dL (ref <150)
VLDL: 14 mg/dL (ref 0–40)

## 2010-07-26 LAB — HEPATIC FUNCTION PANEL
ALT: 16 U/L (ref 0–35)
AST: 22 U/L (ref 0–37)
Alkaline Phosphatase: 40 U/L (ref 39–117)
Bilirubin, Direct: 0.1 mg/dL (ref 0.0–0.3)

## 2010-07-26 LAB — POCT CARDIAC MARKERS
CKMB, poc: 2 ng/mL (ref 1.0–8.0)
Troponin i, poc: <0.05 ng/mL (ref 0.00–0.09)

## 2010-07-26 LAB — PHOSPHORUS: Phosphorus: 4.1 mg/dL (ref 2.3–4.6)

## 2010-07-26 LAB — DIFFERENTIAL
Basophils Absolute: 0 10*3/uL (ref 0.0–0.1)
Eosinophils Relative: 2 % (ref 0–5)
Lymphocytes Relative: 34 % (ref 12–46)
Lymphs Abs: 2.2 10*3/uL (ref 0.7–4.0)
Monocytes Absolute: 0.4 10*3/uL (ref 0.1–1.0)
Monocytes Relative: 7 % (ref 3–12)
Neutro Abs: 3.7 10*3/uL (ref 1.7–7.7)

## 2010-07-26 LAB — HEMOGLOBIN A1C: Mean Plasma Glucose: 114 mg/dL (ref <117)

## 2010-07-26 LAB — TROPONIN I
Troponin I: 0.03 ng/mL (ref 0.00–0.06)
Troponin I: 0.04 ng/mL (ref 0.00–0.06)

## 2010-07-26 LAB — APTT: aPTT: 29 seconds (ref 24–37)

## 2010-07-26 LAB — COMPREHENSIVE METABOLIC PANEL
AST: 18 U/L (ref 0–37)
Albumin: 3.4 g/dL — ABNORMAL LOW (ref 3.5–5.2)
CO2: 24 mEq/L (ref 19–32)
Calcium: 8.7 mg/dL (ref 8.4–10.5)
Creatinine, Ser: 0.56 mg/dL (ref 0.4–1.2)
GFR calc Af Amer: >60 mL/min (ref >60)
GFR calc non Af Amer: >60 mL/min (ref >60)
Total Protein: 6.3 g/dL (ref 6.0–8.3)

## 2010-07-26 LAB — TSH: TSH: 2.602 u[IU]/mL (ref 0.350–4.500)

## 2010-07-26 LAB — MAGNESIUM: Magnesium: 1.9 mg/dL (ref 1.5–2.5)

## 2010-07-26 LAB — POCT PREGNANCY, URINE: Preg Test, Ur: NEGATIVE

## 2010-09-28 NOTE — Discharge Summary (Signed)
[facility]  [name]                           MRN: [id number] Adm. Date:  [id number] Disc. Date: [id number] Attending:  Antionette Char                           Discharge Summary  HISTORY OF PRESENT ILLNESS:   This is a 45 year old gravida 2, para 1-0-0-1, at [redacted] weeks gestation.  The patient presents in early labor.  The patient has a history of a prior cesarean birth.  HOSPITAL COURSE:              The patient had arrest of dilatation and persistent occipitoposterior position and was felt to be necessary for repeat cesarean section.  The patient was taken to the operating room where she had a low transverse cesarean delivery.  The patient had and uncomplicated postoperative course and was felt to be ready for discharge on May 03, 1999.  FOLLOWUP:                     The patient was asked to return in follow-up on Saturday morning to have her staples removed and then to return to [facility] in six weeks. DD:  06/29/99 TD:  06/30/99 Job: [id number]

## 2010-09-28 NOTE — Op Note (Signed)
[facility]  Patient:    Marissa Garcia                           MRN: [id number] Proc. Date: 05/01/99 Adm. Date:  [id number] Disc. Date: [id number] Attending:  Antionette Char                           Operative Report  PREOPERATIVE DIAGNOSIS: 1. 41-2/7 intrauterine pregnancy. 2. Failed augmentation of labor(VBAC). 3. Arrest of dilation. 4. Occiput posterior position. 5. Nonreassuring fetal heart rate tracing.  POSTOPERATIVE DIAGNOSIS: 1. 41-2/7 intrauterine pregnancy. 2. Failed augmentation (VBAC). 3. Arrest of dilation. 4. Occiput posterior position. 5. Nonreassuring fetal heart rate tracing.  OPERATION:  Low transverse cesarean section via Pfannenstiel.  SURGEON:  Conni Elliot, M.D.  ASSISTANT:  Bernette Redbird, M.D.  ANESTHESIA:  Epidural.  COMPLICATIONS:  None.  ESTIMATED BLOOD LOSS:  800 cc.  FLUIDS:  1700 cc crystalloid.  URINE OUTPUT:  800 cc clear urine.  ESTIMATED BLOOD LOSS:  INDICATIONS:  A 45 year old 41-2/7 gestation, gravida 2, para 1-0-0-1, augmented labor with SROM, arrest of dilation, occiput posterior position, nonreassuring fetal heart rate tracing.  FINDINGS:  Female infant in cephalic occiput posterior presentation. Pediatrics present at delivery.  Apgars 9 and 9.  Weight 6 pounds, 14 ounces.  Cord gas 7.27, normal uterus, tubes and ovaries.  DESCRIPTION OF PROCEDURE:  The patient was taken to the operating room where epidural anesthesia was found to be adequate.  She was then prepped and draped n normal sterile fashion in the dorsal supine position with a leftward tilt.  A Pfannenstiel skin incision was then made with a scalpel and carried through to he underlying layer of fascia.  The fascia was nicked in the midline and the incision extended laterally with the Mayo scissors.  The superior aspect of the fascial incision was then grasped with a Kocher clamp, elevated, and the  underlying rectus muscles dissected off bluntly.  Attention was then turned to the inferior aspect of this incision, which, in a similar fashion, was grasped, tented up with the Kocher clamps, and the rectus muscles dissected off bluntly.  The rectus muscles were hen separated in the midline and the peritoneum identified, tented up, and entered sharply with the Metzenbaum scissors.  The peritoneal incision was then extended superiorly and inferiorly with good visualization of the bladder.  The bladder blade was then inserted and the vesicouterine peritoneum identified, grasped with the pickups and entered sharply with the Metzenbaum scissors.  This incision was then then extended laterally and a bladder flap created digitally.  The bladder blade was then reinserted and the lower uterine segment incised in  transverse fashion with the scalpel.  The uterine incision was then extended laterally with the bandage scissors.  The bladder blade was removed and the infants head delivered atraumatically.  Nose and mouth were suctioned and the cord clamped and cut.  The infant was handed off to the awaiting pediatrician.  Cord  gases were sent.  The placenta was then removed manually, the uterus exteriorized, and cleared of all clot and debris.  The uterine incision was repaired with 0 chromic in a running, locked fashion.  Hemostasis was obtained with figure-of-eight stitches using 2-0 chromic suture.  The uterus was returned to the abdomen.  The gutters were cleared of all clots and the peritoneum closed with 2-0 chromic.  The fascia was reapproximated  with 0 Vicryl in a running fashion.  The subcutaneous tissue was  reapproximated with 2-0 plain suture in a running fashion.  The skin was closed  with staples.  The patient tolerated the procedure well.  Sponge, lap and needle counts were correct x 3.  Unasyn 3 gm IV was given at cord clamp, and will be continued q.6h. because of  prolonged ruptured membranes and maternal fever.  The patient was taken to the recovery room in stable condition. DD:  05/01/99 TD:  05/03/99 Job: [id number]

## 2011-05-28 ENCOUNTER — Encounter (HOSPITAL_COMMUNITY): Payer: Self-pay | Admitting: *Deleted

## 2011-05-28 ENCOUNTER — Emergency Department (HOSPITAL_COMMUNITY)
Admission: EM | Admit: 2011-05-28 | Discharge: 2011-05-29 | Disposition: A | Discharge status: Home / Self Care | Payer: Self-pay | Attending: Emergency Medicine | Admitting: Emergency Medicine

## 2011-05-28 DIAGNOSIS — E7800 Pure hypercholesterolemia, unspecified: Secondary | ICD-10-CM | POA: Insufficient documentation

## 2011-05-28 DIAGNOSIS — K602 Anal fissure, unspecified: Secondary | ICD-10-CM | POA: Insufficient documentation

## 2011-05-28 DIAGNOSIS — K5900 Constipation, unspecified: Secondary | ICD-10-CM | POA: Insufficient documentation

## 2011-05-28 HISTORY — DX: Pure hypercholesterolemia, unspecified: E78.00

## 2011-05-28 LAB — BASIC METABOLIC PANEL
BUN: 13 mg/dL (ref 6–23)
GFR calc Af Amer: >90 mL/min (ref >90)
GFR calc non Af Amer: >90 mL/min (ref >90)
Potassium: 3.8 mEq/L (ref 3.5–5.1)
Sodium: 137 mEq/L (ref 135–145)

## 2011-05-28 LAB — DIFFERENTIAL
Basophils Absolute: 0 10*3/uL (ref 0.0–0.1)
Basophils Relative: 0 % (ref 0–1)
Eosinophils Absolute: 0.3 10*3/uL (ref 0.0–0.7)
Monocytes Relative: 7 % (ref 3–12)
Neutrophils Relative %: 55 % (ref 43–77)

## 2011-05-28 LAB — URINALYSIS, ROUTINE W REFLEX MICROSCOPIC
Bilirubin Urine: NEGATIVE
Nitrite: NEGATIVE
Specific Gravity, Urine: 1.009 (ref 1.005–1.030)
Urobilinogen, UA: 0.2 mg/dL (ref 0.0–1.0)

## 2011-05-28 LAB — CBC
MCH: 31.3 pg (ref 26.0–34.0)
MCHC: 34.9 g/dL (ref 30.0–36.0)
Platelets: 264 10*3/uL (ref 150–400)
RDW: 13.3 % (ref 11.5–15.5)

## 2011-05-28 LAB — OCCULT BLOOD, POC DEVICE: Fecal Occult Bld: POSITIVE

## 2011-05-28 NOTE — ED Notes (Signed)
Reports being constipated.  Having a BM tonight and then having bright red rectal bleeding.  Denies abdominal pain.

## 2011-05-28 NOTE — ED Notes (Signed)
Pt arrived to room assignment at this time.

## 2011-05-29 MED ORDER — HYDROCORTISONE ACETATE 25 MG RE SUPP: 25.0000 mg | Freq: Two times a day (BID) | RECTAL | Status: DC

## 2011-05-29 MED ORDER — POLYETHYLENE GLYCOL 3350 17 GM/SCOOP PO POWD: 17.0000 g | Freq: Every day | ORAL | Status: AC

## 2011-05-29 MED ORDER — HYDROCORTISONE ACETATE 25 MG RE SUPP: 25.0000 mg | Freq: Two times a day (BID) | RECTAL | Status: AC

## 2011-05-29 NOTE — ED Notes (Signed)
Pt reports having constipation "all the time through my life" but tonight has some blood in stool after constipation. Pt denies and straining or hemorrhoids. Pt denies any urinary problems, pain or any other problems at this time.

## 2011-05-29 NOTE — ED Notes (Signed)
Pt denies any questions or pain upon discharge. 

## 2011-05-29 NOTE — ED Provider Notes (Cosign Needed)
History     CSN: [id number]  Arrival date & time 05/28/11  2040   First MD Initiated Contact with Patient 05/28/11 2122      Chief Complaint  Patient presents with  . Rectal Bleeding    (Consider location/radiation/quality/duration/timing/severity/associated sxs/prior treatment) HPI Comments: Pt is a 46 year old woman who has a history of constipation and of passing hard stools.  Today she had several bowel movements, and noted blood in the toilet and on the toilet paper.  She had never had this problem before.  She therefore sought evaluation.  Patient is a 46 y.o. female presenting with hematochezia. The history is provided by the patient. No language interpreter was used.  Rectal Bleeding  The current episode started today. The pain is moderate. The stool is described as hard. There was no prior successful therapy (No prior treatment.).    Past Medical History  Diagnosis Date  . Constipation   . Hypercholesteremia     Past Surgical History  Procedure Date  . Cesarean section     History reviewed. No pertinent family history.  History  Substance Use Topics  . Smoking status: Never Smoker   . Smokeless tobacco: Not on file  . Alcohol Use: No    OB History    Grav Para Term Preterm Abortions TAB SAB Ect Mult Living                  Review of Systems  Constitutional: Negative.   HENT: Negative.   Eyes: Negative.   Respiratory: Negative.   Gastrointestinal: Positive for constipation, blood in stool and hematochezia.  Genitourinary: Negative.   Musculoskeletal: Negative.   Skin: Negative.   Neurological: Negative.   Psychiatric/Behavioral: Negative.     Allergies  Review of patient's allergies indicates no known allergies.  Home Medications   Current Outpatient Rx  Name Route Sig Dispense Refill  . TERCONAZOLE 80 MG VA SUPP Vaginal Place 80 mg vaginally at bedtime.      BP 105/66  Pulse 64  Temp(Src) 97.4 F (36.3 C) (Oral)  Resp 18  Ht 5'  (1.524 m)  Wt 136 lb (61.689 kg)  BMI 26.56 kg/m2  SpO2 98%  Physical Exam  Constitutional: She is oriented to person, place, and time. She appears well-developed and well-nourished. No distress.  HENT:  Head: Normocephalic and atraumatic.  Right Ear: External ear normal.  Left Ear: External ear normal.  Mouth/Throat: Oropharynx is clear and moist.  Eyes: Conjunctivae and EOM are normal. Pupils are equal, round, and reactive to light.  Neck: Normal range of motion. Neck supple.  Cardiovascular: Normal rate, regular rhythm and normal heart sounds.   Pulmonary/Chest: Effort normal and breath sounds normal.  Abdominal: Soft. She exhibits no distension. There is no tenderness.  Genitourinary:       Rectal exam shows mild to moderate tenderness on digital exam, but no mass or palpable fissure.  There was mucus streaked with blood noted.  Musculoskeletal: Normal range of motion.  Neurological: She is alert and oriented to person, place, and time.       No sensory or motor deficit.  Skin: Skin is warm and dry.  Psychiatric: She has a normal mood and affect. Her behavior is normal.    ED Course  Procedures (including critical care time)  Labs Reviewed  CBC - Abnormal; Notable for the following:    RBC 3.86 (*)    HCT 34.7 (*)    All other components within normal limits  BASIC METABOLIC PANEL - Abnormal; Notable for the following:    Glucose, Bld 102 (*)    All other components within normal limits  DIFFERENTIAL  URINALYSIS, ROUTINE W REFLEX MICROSCOPIC  PREGNANCY, URINE  OCCULT BLOOD, POC DEVICE  POCT OCCULT BLOOD STOOL, DEVICE    Results for orders placed during the hospital encounter of 05/28/11  CBC      Component Value Range   WBC 10.0  4.0 - 10.5 (K/uL)   RBC 3.86 (*) 3.87 - 5.11 (MIL/uL)   Hemoglobin 12.1  12.0 - 15.0 (g/dL)   HCT 16.1 (*) 09.6 - 46.0 (%)   MCV 89.9  78.0 - 100.0 (fL)   MCH 31.3  26.0 - 34.0 (pg)   MCHC 34.9  30.0 - 36.0 (g/dL)   RDW 04.5  40.9 -  81.1 (%)   Platelets 264  150 - 400 (K/uL)  DIFFERENTIAL      Component Value Range   Neutrophils Relative 55  43 - 77 (%)   Neutro Abs 5.4  1.7 - 7.7 (K/uL)   Lymphocytes Relative 36  12 - 46 (%)   Lymphs Abs 3.6  0.7 - 4.0 (K/uL)   Monocytes Relative 7  3 - 12 (%)   Monocytes Absolute 0.7  0.1 - 1.0 (K/uL)   Eosinophils Relative 3  0 - 5 (%)   Eosinophils Absolute 0.3  0.0 - 0.7 (K/uL)   Basophils Relative 0  0 - 1 (%)   Basophils Absolute 0.0  0.0 - 0.1 (K/uL)  BASIC METABOLIC PANEL      Component Value Range   Sodium 137  135 - 145 (mEq/L)   Potassium 3.8  3.5 - 5.1 (mEq/L)   Chloride 103  96 - 112 (mEq/L)   CO2 23  19 - 32 (mEq/L)   Glucose, Bld 102 (*) 70 - 99 (mg/dL)   BUN 13  6 - 23 (mg/dL)   Creatinine, Ser 9.14  0.50 - 1.10 (mg/dL)   Calcium 9.5  8.4 - 78.2 (mg/dL)   GFR calc non Af Amer >90  >90 (mL/min)   GFR calc Af Amer >90  >90 (mL/min)  URINALYSIS, ROUTINE W REFLEX MICROSCOPIC      Component Value Range   Color, Urine YELLOW  YELLOW    APPearance CLEAR  CLEAR    Specific Gravity, Urine 1.009  1.005 - 1.030    pH 7.0  5.0 - 8.0    Glucose, UA NEGATIVE  NEGATIVE (mg/dL)   Hgb urine dipstick NEGATIVE  NEGATIVE    Bilirubin Urine NEGATIVE  NEGATIVE    Ketones, ur NEGATIVE  NEGATIVE (mg/dL)   Protein, ur NEGATIVE  NEGATIVE (mg/dL)   Urobilinogen, UA 0.2  0.0 - 1.0 (mg/dL)   Nitrite NEGATIVE  NEGATIVE    Leukocytes, UA NEGATIVE  NEGATIVE   PREGNANCY, URINE      Component Value Range   Preg Test, Ur NEGATIVE    OCCULT BLOOD, POC DEVICE      Component Value Range   Fecal Occult Bld POSITIVE     Pt's lab workup was essentially normal except for stool positive for blood.  This is probably from a fissure resulting from straining at stool.  Rx Miralax, Anusol HC suppositories bid, F/U with Dr. Feliciana Rossetti, her physician.      1. Rectal fissure   2. Constipation           Carleene Cooper III, MD 05/29/11 [phone]

## 2013-02-23 ENCOUNTER — Emergency Department (HOSPITAL_COMMUNITY)
Admission: EM | Admit: 2013-02-23 | Discharge: 2013-02-24 | Disposition: A | Discharge status: Home / Self Care | Payer: No Typology Code available for payment source | Attending: Emergency Medicine | Admitting: Emergency Medicine

## 2013-02-23 ENCOUNTER — Encounter (HOSPITAL_COMMUNITY): Payer: Self-pay | Admitting: Emergency Medicine

## 2013-02-23 DIAGNOSIS — M79609 Pain in unspecified limb: Secondary | ICD-10-CM | POA: Insufficient documentation

## 2013-02-23 DIAGNOSIS — Z3202 Encounter for pregnancy test, result negative: Secondary | ICD-10-CM | POA: Insufficient documentation

## 2013-02-23 DIAGNOSIS — M545 Low back pain, unspecified: Secondary | ICD-10-CM | POA: Insufficient documentation

## 2013-02-23 DIAGNOSIS — E7800 Pure hypercholesterolemia, unspecified: Secondary | ICD-10-CM | POA: Insufficient documentation

## 2013-02-23 DIAGNOSIS — Z8719 Personal history of other diseases of the digestive system: Secondary | ICD-10-CM | POA: Insufficient documentation

## 2013-02-23 DIAGNOSIS — R42 Dizziness and giddiness: Secondary | ICD-10-CM | POA: Insufficient documentation

## 2013-02-23 DIAGNOSIS — R55 Syncope and collapse: Secondary | ICD-10-CM | POA: Insufficient documentation

## 2013-02-23 DIAGNOSIS — F411 Generalized anxiety disorder: Secondary | ICD-10-CM | POA: Insufficient documentation

## 2013-02-23 DIAGNOSIS — M549 Dorsalgia, unspecified: Secondary | ICD-10-CM

## 2013-02-23 NOTE — ED Notes (Addendum)
Pt states that she went to a md and was seen for her right hand going numb and falling asleep. Pt states that today her left hand started hurting. Pt states HA and Back pain as well for a while and she was going to get medication in her back but she was scared to have the medication given to her. Pt had medication and then asked what medication it was and they did not answer. Pt worried that the medication made her back pain worse.

## 2013-02-24 LAB — POCT I-STAT, CHEM 8
BUN: 12 mg/dL (ref 6–23)
Calcium, Ion: 1.19 mmol/L (ref 1.12–1.23)
Chloride: 108 mEq/L (ref 96–112)
Creatinine, Ser: 0.7 mg/dL (ref 0.50–1.10)
Glucose, Bld: 101 mg/dL — ABNORMAL HIGH (ref 70–99)
HCT: 36 % (ref 36.0–46.0)
Hemoglobin: 12.2 g/dL (ref 12.0–15.0)
Potassium: 3.6 mEq/L (ref 3.5–5.1)
Sodium: 140 mEq/L (ref 135–145)
TCO2: 21 mmol/L (ref 0–100)

## 2013-02-24 LAB — URINALYSIS, ROUTINE W REFLEX MICROSCOPIC
Bilirubin Urine: NEGATIVE
Glucose, UA: NEGATIVE mg/dL
Hgb urine dipstick: NEGATIVE
Ketones, ur: NEGATIVE mg/dL
Protein, ur: NEGATIVE mg/dL

## 2013-02-24 LAB — CBC
HCT: 33.7 % — ABNORMAL LOW (ref 36.0–46.0)
MCHC: 35.9 g/dL (ref 30.0–36.0)
MCV: 88.2 fL (ref 78.0–100.0)
RDW: 13.4 % (ref 11.5–15.5)

## 2013-02-24 MED ORDER — KETOROLAC TROMETHAMINE 30 MG/ML IJ SOLN
30.0000 mg | Freq: Once | INTRAMUSCULAR | Status: AC
  Administered 2013-02-24: 30 mg via INTRAVENOUS
  Filled 2013-02-24: qty 1

## 2013-02-24 MED ORDER — FENTANYL CITRATE 0.05 MG/ML IJ SOLN
50.0000 ug | INTRAMUSCULAR | Status: DC | PRN
  Administered 2013-02-24: 50 ug via INTRAVENOUS
  Filled 2013-02-24: qty 2

## 2013-02-24 MED ORDER — ONDANSETRON HCL 4 MG/2ML IJ SOLN
4.0000 mg | Freq: Once | INTRAMUSCULAR | Status: AC
  Administered 2013-02-24: 4 mg via INTRAVENOUS
  Filled 2013-02-24: qty 2

## 2013-02-24 MED ORDER — SODIUM CHLORIDE 0.9 % IV BOLUS (SEPSIS): 1000.0000 mL | Freq: Once | INTRAVENOUS | Status: DC

## 2013-02-24 NOTE — ED Provider Notes (Signed)
CSN: [id number]     Arrival date & time 02/23/13  1942 History   First MD Initiated Contact with Patient 02/23/13 2319     Chief Complaint  Patient presents with  . multiple complaints    (Consider location/radiation/quality/duration/timing/severity/associated sxs/prior Treatment) HPI History provided by patient through Spanish speaking interpreter. Went to the clinic today for right arm pain. She received an injection in her right shoulder which completely resolved that pain. She was also given an injection in her lumbar spine for some mild back pain. Patient was apprehensive about getting a lumbar injection and afterwards developed near syncopal symptoms with lightheadedness and dizziness. This occurred when she was being discharged and she ended up being brought back into an exam room where she was placed in Trendelenburg position until she was feeling better. She was also given a lab slip for outpatient labs to be done tomorrow.  She presents to the emergency department because after going home she continued to feel lightheaded and developed a good deal of anxiety about having a lumbar injection. No syncope. No weakness or numbness. No chest pain or shortness of breath. No fevers or chills. Patient is requesting blood work to be done. She still has some mild back pain is not radiating, sharp in quality and worse with walking and movement. No urinary or fecal incontinence. No perineal paresthesias. Past Medical History  Diagnosis Date  . Constipation   . Hypercholesteremia    Past Surgical History  Procedure Laterality Date  . Cesarean section     History reviewed. No pertinent family history. History  Substance Use Topics  . Smoking status: Never Smoker   . Smokeless tobacco: Not on file  . Alcohol Use: No   OB History   Grav Para Term Preterm Abortions TAB SAB Ect Mult Living                 Review of Systems  Constitutional: Negative for fever and chills.  Eyes: Negative for  pain.  Respiratory: Negative for shortness of breath.   Cardiovascular: Negative for chest pain.  Gastrointestinal: Negative for vomiting and abdominal pain.  Genitourinary: Negative for dysuria.  Musculoskeletal: Positive for back pain. Negative for neck pain and neck stiffness.  Skin: Negative for rash.  Neurological: Positive for dizziness and light-headedness. Negative for headaches.  All other systems reviewed and are negative.    Allergies  Review of patient's allergies indicates no known allergies.  Home Medications   Current Outpatient Rx  Name  Route  Sig  Dispense  Refill  . acetaminophen (TYLENOL) 500 MG tablet   Oral   Take 500 mg by mouth every 6 (six) hours as needed for pain.          BP 108/58  Pulse 58  Temp(Src) 97.7 F (36.5 C) (Oral)  Resp 15  Wt 138 lb 1.6 oz (62.642 kg)  BMI 26.97 kg/m2  SpO2 100% Physical Exam  Constitutional: She is oriented to person, place, and time. She appears well-developed and well-nourished.  HENT:  Head: Normocephalic and atraumatic.  Eyes: Conjunctivae and EOM are normal. Pupils are equal, round, and reactive to light.  Neck: Full passive range of motion without pain. Neck supple. No thyromegaly present.  No meningismus  Cardiovascular: Normal rate, regular rhythm, S1 normal, S2 normal and intact distal pulses.   Pulmonary/Chest: Effort normal and breath sounds normal.  Abdominal: Soft. Bowel sounds are normal. There is no tenderness. There is no CVA tenderness.  Musculoskeletal: Normal range of  motion.  Minimal soft tissue tenderness over lower lumbar spine without any midline deformity. No erythema or swelling. No lower extremity deficits with gait intact, equal DTRs, equal strengths and sensorium to light touch.  Neurological: She is alert and oriented to person, place, and time. She has normal strength and normal reflexes. No cranial nerve deficit or sensory deficit. She displays a negative Romberg sign. GCS eye  subscore is 4. GCS verbal subscore is 5. GCS motor subscore is 6.  Normal Gait. No nystagmus  Skin: Skin is warm and dry. No rash noted. No cyanosis. Nails show no clubbing.  Psychiatric: Her speech is normal.  Moderate anxiety    ED Course  Procedures (including critical care time) Labs Review Labs Reviewed  CBC - Abnormal; Notable for the following:    WBC 11.4 (*)    RBC 3.82 (*)    HCT 33.7 (*)    All other components within normal limits  POCT I-STAT, CHEM 8 - Abnormal; Notable for the following:    Glucose, Bld 101 (*)    All other components within normal limits  PREGNANCY, URINE  URINALYSIS, ROUTINE W REFLEX MICROSCOPIC     Date: 02/24/2013  Rate: 62   Rhythm: normal sinus rhythm  QRS Axis: normal  Intervals: normal  ST/T Wave abnormalities: nonspecific ST changes  Conduction Disutrbances:none  Narrative Interpretation:   Old EKG Reviewed: unchanged  IV fluids. IV fentanyl. IV Toradol.  2:36 AM recheck after medications is feeling significantly better and requesting to be discharged home. Patient also requesting outpatient referrals which were provided. She has prescriptions for NSAIDs and Flexeril which she agrees to start taking for her back as needed. She agrees to strict return precautions.  MDM  Diagnosis: Back pain, near syncope  Evaluated with EKG, labs, urinalysis all reviewed as above. No indication for emergent CT brain or lumbar imaging at this time Improved with medications and IV fluids Vital signs and nursing notes reviewed and considered   Sunnie Nielsen, MD 02/24/13 [phone]

## 2013-02-26 ENCOUNTER — Emergency Department (INDEPENDENT_AMBULATORY_CARE_PROVIDER_SITE_OTHER)
Admission: EM | Admit: 2013-02-26 | Discharge: 2013-02-26 | Disposition: A | Discharge status: Home / Self Care | Payer: Self-pay | Source: Home / Self Care | Attending: Family Medicine | Admitting: Family Medicine

## 2013-02-26 ENCOUNTER — Encounter (HOSPITAL_COMMUNITY): Payer: Self-pay | Admitting: Emergency Medicine

## 2013-02-26 ENCOUNTER — Emergency Department (HOSPITAL_COMMUNITY)
Admission: EM | Admit: 2013-02-26 | Discharge: 2013-02-26 | Disposition: A | Discharge status: Home / Self Care | Payer: No Typology Code available for payment source | Attending: Emergency Medicine | Admitting: Emergency Medicine

## 2013-02-26 ENCOUNTER — Emergency Department (HOSPITAL_COMMUNITY): Payer: No Typology Code available for payment source

## 2013-02-26 DIAGNOSIS — R29898 Other symptoms and signs involving the musculoskeletal system: Secondary | ICD-10-CM

## 2013-02-26 DIAGNOSIS — R5381 Other malaise: Secondary | ICD-10-CM | POA: Insufficient documentation

## 2013-02-26 DIAGNOSIS — Z8639 Personal history of other endocrine, nutritional and metabolic disease: Secondary | ICD-10-CM | POA: Insufficient documentation

## 2013-02-26 DIAGNOSIS — F411 Generalized anxiety disorder: Secondary | ICD-10-CM | POA: Insufficient documentation

## 2013-02-26 DIAGNOSIS — M549 Dorsalgia, unspecified: Secondary | ICD-10-CM

## 2013-02-26 DIAGNOSIS — Z79899 Other long term (current) drug therapy: Secondary | ICD-10-CM | POA: Insufficient documentation

## 2013-02-26 DIAGNOSIS — M5126 Other intervertebral disc displacement, lumbar region: Secondary | ICD-10-CM | POA: Insufficient documentation

## 2013-02-26 DIAGNOSIS — M545 Low back pain: Secondary | ICD-10-CM

## 2013-02-26 DIAGNOSIS — M5416 Radiculopathy, lumbar region: Secondary | ICD-10-CM

## 2013-02-26 DIAGNOSIS — Z8719 Personal history of other diseases of the digestive system: Secondary | ICD-10-CM | POA: Insufficient documentation

## 2013-02-26 DIAGNOSIS — Z862 Personal history of diseases of the blood and blood-forming organs and certain disorders involving the immune mechanism: Secondary | ICD-10-CM | POA: Insufficient documentation

## 2013-02-26 MED ORDER — NAPROXEN 500 MG PO TABS: 500.0000 mg | ORAL_TABLET | Freq: Two times a day (BID) | ORAL | Status: DC

## 2013-02-26 MED ORDER — HYDROMORPHONE HCL PF 1 MG/ML IJ SOLN
1.0000 mg | Freq: Once | INTRAMUSCULAR | Status: AC
  Administered 2013-02-26: 1 mg via INTRAVENOUS
  Filled 2013-02-26: qty 1

## 2013-02-26 MED ORDER — OXYCODONE-ACETAMINOPHEN 5-325 MG PO TABS: 1.0000 | ORAL_TABLET | Freq: Four times a day (QID) | ORAL | Status: DC | PRN

## 2013-02-26 NOTE — ED Notes (Addendum)
Pt states that on 10/13 she had an injection at her PCP (pt states that they didn't want to tell her the type of injection it was). She states that after the injection she became lightheaded. Pt states that after the injection she has started having lower back pain and leg weakness. She thinks the injection made her back pain worse.

## 2013-02-26 NOTE — ED Notes (Signed)
Pt sent from [facility] for further eval of lower back pain with bilateral leg weakness; pt denies incontinence; pt sts started after having injection in back

## 2013-02-26 NOTE — ED Provider Notes (Signed)
CSN: [id number]     Arrival date & time 02/26/13  1753 History   First MD Initiated Contact with Patient 02/26/13 1908     Chief Complaint  Patient presents with  . Back Pain   (Consider location/radiation/quality/duration/timing/severity/associated sxs/prior Treatment) HPI Comments: Patient presents to the ED with a chief complaint of lower back pain.   She was sent to the ED from Urgent Care due to concern for Cauda Equina.  Note from Urgent Care reviewed.  Note from Urgent Care states that the patient had mentioned fecal incontinence.  However, patient denies to me that she has had any bowel or bladder incontinence.  She reports that the pain began four days ago.  Pain associated with some bilateral lower extremity weakness.  She reports that she was seen by her PCP four days ago and given a spinal injection of her lower back at that time. Pain gradually worsening.  Pain does not radiate.  Pain worsens with movements.  Patient denies numbness or tingling.  Denies fever or chills.  She has been taking Flexeril and Indomethecin without improvement.  Patient denies any history of IVDU.  Patient also denies any history of Cancer.  The history is provided by the patient.    Past Medical History  Diagnosis Date  . Constipation   . Hypercholesteremia    Past Surgical History  Procedure Laterality Date  . Cesarean section     History reviewed. No pertinent family history. History  Substance Use Topics  . Smoking status: Never Smoker   . Smokeless tobacco: Not on file  . Alcohol Use: No   OB History   Grav Para Term Preterm Abortions TAB SAB Ect Mult Living                 Review of Systems  Genitourinary: Negative for dysuria, urgency and frequency.  Musculoskeletal: Positive for back pain.  Psychiatric/Behavioral: The patient is nervous/anxious.     Allergies  Review of patient's allergies indicates no known allergies.  Home Medications   Current Outpatient Rx  Name  Route   Sig  Dispense  Refill  . acetaminophen (TYLENOL) 500 MG tablet   Oral   Take 500 mg by mouth every 6 (six) hours as needed for pain.         . cyclobenzaprine (FLEXERIL) 10 MG tablet   Oral   Take 10 mg by mouth at bedtime as needed for muscle spasms (muscle spasms).         . indomethacin (INDOCIN) 25 MG capsule   Oral   Take 25 mg by mouth 3 (three) times daily with meals.          BP 101/58  Pulse 66  Temp(Src) 97.8 F (36.6 C) (Oral)  Resp 16  SpO2 100% Physical Exam  Nursing note and vitals reviewed. Constitutional: She appears well-developed and well-nourished.  HENT:  Head: Normocephalic and atraumatic.  Mouth/Throat: Oropharynx is clear and moist.  Neck: Normal range of motion. Neck supple.  Cardiovascular: Normal rate, regular rhythm and normal heart sounds.   Pulses:      Dorsalis pedis pulses are 2+ on the right side, and 2+ on the left side.  Pulmonary/Chest: Effort normal and breath sounds normal.  Genitourinary: Rectum normal. Rectal exam shows anal tone normal.  Normal rectal tone  Musculoskeletal:       Lumbar back: She exhibits tenderness and bony tenderness. She exhibits normal range of motion, no swelling, no edema and no deformity.  Neurological:  She is alert. No sensory deficit.  Reflex Scores:      Patellar reflexes are 2+ on the right side and 2+ on the left side.      Achilles reflexes are 2+ on the right side and 2+ on the left side. Muscle strength 4/5 of lower extremities bilaterally.  Skin: Skin is warm and dry.  Psychiatric: She has a normal mood and affect.    ED Course  Procedures (including critical care time) Labs Review Labs Reviewed - No data to display Imaging Review Mr Lumbar Spine Wo Contrast  02/26/2013   CLINICAL DATA:  Extreme low back pain with bilateral lower extremity weakness following spinal injection earlier this week  EXAM: MRI LUMBAR SPINE WITHOUT CONTRAST  TECHNIQUE: Multiplanar, multisequence MR imaging was  performed. No intravenous contrast was administered.  COMPARISON:  None.  FINDINGS: For the purposes of this dictation, the lowest well-formed intervertebral disc spaces presumed to be the L5-S1 level, and there presumed to be 5 lumbar type vertebral bodies. The vertebral bodies are normally aligned with preservation of the normal lumbar lordosis. Vertebral body heights are well maintained. Subcentimeter benign hemangioma centered within the superior aspect of L4. Otherwise, the on signal intensity within the vertebral body bone marrow is normal. Signal intensity within the spinal cord is within normal limits. The conus medullaris terminates at the T12-L1 level.  At T12-L1, there is no disc bulge or disc protrusion. No significant facet arthrosis. No canal or neural foraminal stenosis.  At L1-2, there is no disc bulge or disc protrusion. No significant facet disease. No canal or foraminal narrowing.  At L2-3, there is no significant disc bulge. No focal disc protrusion. No significant facet arthropathy. No canal or neural foraminal stenosis.  At L3-4, there is no disc bulge or focal disc protrusion. Mild bilateral facet hypertrophy is present. There is mild central canal narrowing at this level due to short pedicles. No neural foraminal stenosis.  At L4-5, there is minimal circumferential disk bulge without focal disc protrusion. Mild bilateral facet hypertrophy is present, right worse than left. There is mild to moderate central canal narrowing due to short pedicles and the hypertrophied facets. No significant foraminal narrowing.  At L5-S1, there is mild diffuse disc bulge with disk desiccation and mild bilateral facet hypertrophy. A small superimposed left paracentral disc protrusion is present encroaching upon the left lateral recess. There is an associated annular fissure. There is impingement of the left S1 nerve root as it courses through the left lateral recess. Mild canal stenosis is present. No significant  foraminal narrowing.  Multiple T2 hypointense gallstones are noted within the partially visualized gallbladder. T2 hyperintense round cystic structure seen in the right lower quadrant likely represents and right ovarian cyst. This measures 2.2 x 2.1 cm. This finding is incompletely evaluated on this examination.  Visualized paraspinous musculature is within normal limits. No epidural hematoma or other abnormality from recent spinal injection identified.  IMPRESSION: 1. No MRI evidence of complication including epidural hematoma status post recent spinal injection. 2. Small left paracentral disc protrusion at L5-S1 with secondary impingement of the left S1 nerve root in the left lateral recess. 3. Mild central canal narrowing due to short pedicles and facet hypertrophy at L3-4 and L4-5, more prominent at L4-5. 4. Cholelithiasis, incompletely evaluated. 5. Probable right ovarian cyst, incompletely evaluated   Electronically Signed   By: Rise Mu M.D.   On: 02/26/2013 22:09    EKG Interpretation   None  11:04 PM Reassessed patient.  Patient reports that her pain has improved at this time.  Discussed the results of the MRI with the patient.  MDM  No diagnosis found. Patient presents today with a chief complaint of lower back pain.  Patient sent to the ED by Urgent Care for MRI due to concern for Cauda Equina.  In the ED the patient denied any bowel or bladder incontinence.  Denies saddle anaesthesia.   Patient able to ambulate.  Patient is afebrile.  No history of Cancer or IVDU.  MRI results as above showing small left disc protrusion with secondary impingement of the left S1 nerve root.  Pain improved after given pain medication in the ED.  Feel that the patient is stable for discharge.  Patient discharged home with Rx for pain medication.  Patient also given referral to Neurosurgery.  Return precautions discussed.    Pascal Lux [place], PA-C 02/26/13 2311  Pascal Lux [place],  PA-C 02/26/13 [phone]

## 2013-02-26 NOTE — ED Notes (Signed)
Pt taken to MRI  

## 2013-02-26 NOTE — ED Notes (Signed)
States she saw a MD on 10-13 for back pain and hand problems. Was reportedly given an injection in her back after a long discussion w the provider ,and her voicing a number of concerns (is this safe?  ) that has made her back worse. Husband said they would not tell her later what the medicine was that she was given

## 2013-02-26 NOTE — ED Provider Notes (Signed)
Marissa Garcia is a 47 y.o. female who presents to Urgent Care today for severe low back pain with bilateral lower extremity weakness.   Patient was seen in the primary care office on Monday where she was evaluated for low back pain and treated with a unknown injection and around her lumbar spine. She denies any fluoroscopy there for the most likely injection was a SI joint injection or trigger point injection.  She notes worsening back pain and now worsening lower extremity weakness since Monday. She notes difficulty walking because of weakness. Additionally she notes loose stools and some fecal incontinence. She was seen in the emergency room on October 14 however her symptoms have worsened dramatically since then.    Past Medical History  Diagnosis Date  . Constipation   . Hypercholesteremia    History  Substance Use Topics  . Smoking status: Never Smoker   . Smokeless tobacco: Not on file  . Alcohol Use: No   ROS as above Medications reviewed. No current facility-administered medications for this encounter.   Current Outpatient Prescriptions  Medication Sig Dispense Refill  . acetaminophen (TYLENOL) 500 MG tablet Take 500 mg by mouth every 6 (six) hours as needed for pain.        Exam:  BP 157/89  Pulse 80  Temp(Src) 98.6 F (37 C) (Oral)  Resp 18  SpO2 99%  Gen: Well NAD HEENT: EOMI,  MMM Lungs: CTABL Nl WOB Heart: RRR no MRG Exts: Non edematous BL  LE, warm and well perfused.  Back: Tender to palpation lumbar spinal midline.  Tender palpation bilateral lumbar SI joints Week to knee extension flexion ankle dorsiflexion and plantarflexion. Strength is approximately 4+/5 throughout Patient has difficulty ambulating due to weakness.  Reflexes are diminished but intact and equal bilateral knees and ankles.  Sensation is intact throughout   No results found for this or any previous visit (from the past 24 hour(s)). No results found.  Assessment and Plan: 47 y.o. female  with back pain with bilateral lower extremity weakness worsening over the past 5 days.  This is concerning for cauda equina syndrome. Plan to transfer the patient to the emergency room for MRI and further evaluation and management. Discussed warning signs or symptoms. Please see discharge instructions. Patient expresses understanding.      Rodolph Bong, MD 02/26/13 [phone]

## 2013-02-27 NOTE — ED Provider Notes (Signed)
Medical screening examination/treatment/procedure(s) were conducted as a shared visit with non-physician practitioner(s) and myself.  I personally evaluated the patient during the encounter  Patient overall well-appearing.  Strength in her legs.  MRI scan without acute pathology.  Doubt epidural abscess.  No signs of cauda equina.  Lyanne Co, MD 02/27/13 [phone]

## 2013-03-08 ENCOUNTER — Ambulatory Visit: Payer: No Typology Code available for payment source

## 2013-03-22 ENCOUNTER — Ambulatory Visit: Payer: No Typology Code available for payment source | Attending: Internal Medicine

## 2013-03-26 ENCOUNTER — Ambulatory Visit: Payer: No Typology Code available for payment source | Attending: Internal Medicine | Admitting: Internal Medicine

## 2013-03-26 ENCOUNTER — Encounter: Payer: Self-pay | Admitting: Internal Medicine

## 2013-03-26 MED ORDER — FAMOTIDINE 20 MG PO TABS: 20.0000 mg | ORAL_TABLET | Freq: Two times a day (BID) | ORAL | Status: DC

## 2013-03-26 MED ORDER — NAPROXEN 500 MG PO TABS: 500.0000 mg | ORAL_TABLET | Freq: Two times a day (BID) | ORAL | Status: DC

## 2013-03-26 NOTE — Progress Notes (Unsigned)
Here to establish care Pt has acute lumbar back pain.

## 2013-03-26 NOTE — Progress Notes (Unsigned)
Patient ID: Marissa Garcia, female   DOB: 06-Jul-1965, 47 y.o.   MRN: [id number]   HPI:   Current Outpatient Prescriptions on File Prior to Visit  Medication Sig Dispense Refill  . acetaminophen (TYLENOL) 500 MG tablet Take 500 mg by mouth every 6 (six) hours as needed for pain.      . cyclobenzaprine (FLEXERIL) 10 MG tablet Take 10 mg by mouth at bedtime as needed for muscle spasms (muscle spasms).      . indomethacin (INDOCIN) 25 MG capsule Take 25 mg by mouth 3 (three) times daily with meals.      . naproxen (NAPROSYN) 500 MG tablet Take 1 tablet (500 mg total) by mouth 2 (two) times daily.  30 tablet  0  . oxyCODONE-acetaminophen (PERCOCET/ROXICET) 5-325 MG per tablet Take 1-2 tablets by mouth every 6 (six) hours as needed for pain.  20 tablet  0   No current facility-administered medications on file prior to visit.    No Known Allergies Past Medical History  Diagnosis Date  . Constipation   . Hypercholesteremia     No family history on file. History   Social History  . Marital Status: Single    Spouse Name: N/A    Number of Children: N/A  . Years of Education: N/A   Occupational History  . Not on file.   Social History Main Topics  . Smoking status: Never Smoker   . Smokeless tobacco: Not on file  . Alcohol Use: No  . Drug Use: No  . Sexual Activity:    Other Topics Concern  . Not on file   Social History Narrative  . No narrative on file    Review of Systems  Constitutional: Negative for fever, chills, diaphoresis, activity change, appetite change and fatigue.  HENT: Negative for ear pain, nosebleeds, congestion, facial swelling, rhinorrhea, neck pain, neck stiffness and ear discharge.  Eyes: Negative for pain, discharge, redness, itching and visual disturbance.  Respiratory: Negative for cough, choking, chest tightness, shortness of breath, wheezing and stridor.  Cardiovascular: Negative for chest pain, palpitations and leg swelling.  Gastrointestinal:  Negative for abdominal distention.  Genitourinary: Negative for dysuria, urgency, frequency, hematuria, flank pain, decreased urine volume, difficulty urinating and dyspareunia.  Musculoskeletal: positive for back pain, no joint swelling, arthralgias or gait problem.  Neurological: Negative for dizziness, tremors, seizures, syncope, facial asymmetry, speech difficulty, weakness, light-headedness, numbness and headaches.  Hematological: Negative for adenopathy. Does not bruise/bleed easily.  Psychiatric/Behavioral: Negative for hallucinations, behavioral problems, confusion, dysphoric mood, decreased concentration and agitation.    Objective:   Filed Vitals:   03/26/13 1720  BP: 111/69  Pulse: 64  Temp: 98.2 F (36.8 C)  Resp: 16   Filed Weights   03/26/13 1720  Weight: 142 lb (64.411 kg)    Physical Exam ______ Constitutional: Appears well-developed and well-nourished. No distress. ____ HENT: Normocephalic. External right and left ear normal. Oropharynx is clear and moist. ____ Eyes: Conjunctivae and EOM are normal. PERRLA, no scleral icterus. ____ Neck: Normal ROM. Neck supple. No JVD. No tracheal deviation. No thyromegaly. ____ CVS: RRR, S1/S2 +, no murmurs, no gallops, no carotid bruit.  Pulmonary: Effort and breath sounds normal, no stridor, rhonchi, wheezes, rales.  Abdominal: Soft. BS +,  no distension, tenderness, rebound or guarding. ________ Musculoskeletal: Normal range of motion. No edema and no tenderness. ____ Neuro: Alert. Normal reflexes, muscle tone coordination. No cranial nerve deficit. Skin: Skin is warm and dry. No rash noted. Not diaphoretic. No erythema.  No pallor. ____ Psychiatric: Normal mood and affect. Behavior, judgment, thought content normal. __  Lab Results  Component Value Date   WBC 11.4* 02/24/2013   HGB 12.2 02/24/2013   HCT 36.0 02/24/2013   MCV 88.2 02/24/2013   PLT 259 02/24/2013   Lab Results  Component Value Date   CREATININE 0.70  02/24/2013   BUN 12 02/24/2013   NA 140 02/24/2013   K 3.6 02/24/2013   CL 108 02/24/2013   CO2 23 05/28/2011    Lab Results  Component Value Date   HGBA1C  Value: 5.6 (NOTE)                                                                       According to the ADA Clinical Practice Recommendations for 2011, when HbA1c is used as a screening test:   >=6.5%   Diagnostic of Diabetes Mellitus           (if abnormal result  is confirmed)  5.7-6.4%   Increased risk of developing Diabetes Mellitus  References:Diagnosis and Classification of Diabetes Mellitus,Diabetes Care,2011,34(Suppl 1):S62-S69 and Standards of Medical Care in         Diabetes - 2011,Diabetes Care,2011,34  (Suppl 1):S11-S61. 02/16/2010   Lipid Panel     Component Value Date/Time   CHOL 208* 03/27/2010 2148   TRIG 90 03/27/2010 2148   HDL 59 03/27/2010 2148   CHOLHDL 3.5 Ratio 03/27/2010 2148   VLDL 18 03/27/2010 2148   LDLCALC 131* 03/27/2010 2148        Patient Active Problem List   Diagnosis Date Noted  . DENTAL CARIES 07/05/2010  . HEADACHE 03/21/2010  . ANXIETY STATE, UNSPECIFIED 02/27/2010  . ACID REFLUX DISEASE 02/27/2010  . WRIST PAIN, RIGHT 02/27/2010  . DIZZINESS 02/27/2010  . CHEST PAIN 02/15/2010  . HYPERCHOLESTEROLEMIA 05/13/2008       Assessment and plan: Lumbar pain - referral to sports med - Napoxen prescription- along with Pepcid F/u in 1 month Calvert Cantor, MD

## 2013-05-20 ENCOUNTER — Encounter: Payer: Self-pay | Admitting: Internal Medicine

## 2013-05-20 ENCOUNTER — Ambulatory Visit: Payer: No Typology Code available for payment source | Attending: Internal Medicine | Admitting: Internal Medicine

## 2013-05-20 VITALS — BP 118/74 | HR 73 | Temp 97.9°F | Resp 16 | Ht 59.0 in | Wt 143.0 lb

## 2013-05-20 DIAGNOSIS — J039 Acute tonsillitis, unspecified: Secondary | ICD-10-CM

## 2013-05-20 DIAGNOSIS — J111 Influenza due to unidentified influenza virus with other respiratory manifestations: Secondary | ICD-10-CM

## 2013-05-20 MED ORDER — AZITHROMYCIN 250 MG PO TABS: ORAL_TABLET | ORAL | Status: DC

## 2013-05-20 MED ORDER — GUAIFENESIN-CODEINE 100-10 MG/5ML PO SOLN: 5.0000 mL | Freq: Three times a day (TID) | ORAL | Status: DC | PRN

## 2013-05-20 MED ORDER — OSELTAMIVIR PHOSPHATE 75 MG PO CAPS: 75.0000 mg | ORAL_CAPSULE | Freq: Two times a day (BID) | ORAL | Status: DC

## 2013-05-20 NOTE — Progress Notes (Signed)
Pt is here today with flu like symptoms that she has had since May 04, 2013

## 2013-05-20 NOTE — Progress Notes (Signed)
Patient ID: Marissa Garcia Garcia, female   DOB: Jun 20, 1965, 48 y.o.   MRN: [id number] Patient Demographics  Marissa Garcia Romanoff, is a 48 y.o. female  [id number]  [id number]  DOB - Jun 20, 1965  Chief Complaint  Patient presents with  . Follow-up        Subjective:   Marissa Garcia Tat is a 48 y.o. female here today for a follow up visit. Patient's major complaint include sore throat, nasal congestion, sneezing, headache, general body aches, occasional fever, cough. Cough is not productive of sputum. No chest pain. No shortness of breath. Patient has no significant past medical history except for dyslipidemia and generalized anxiety. Patient does not smoke cigarette Patient has No headache, No chest pain, No abdominal pain - No Nausea, No new weakness tingling or numbness, No Cough - SOB.  ALLERGIES: No Known Allergies  PAST MEDICAL HISTORY: Past Medical History  Diagnosis Date  . Constipation   . Hypercholesteremia     MEDICATIONS AT HOME: Prior to Admission medications   Medication Sig Start Date End Date Taking? Authorizing Provider  acetaminophen (TYLENOL) 500 MG tablet Take 500 mg by mouth every 6 (six) hours as needed for pain.    Historical Provider, MD  azithromycin (ZITHROMAX Z-PAK) 250 MG tablet Take 2 today, then 1 daily for 4 days 05/20/13   Jeanann Lewandowskylugbemiga Mikhi Athey, MD  cyclobenzaprine (FLEXERIL) 10 MG tablet Take 10 mg by mouth at bedtime as needed for muscle spasms (muscle spasms).    Historical Provider, MD  famotidine (PEPCID) 20 MG tablet Take 1 tablet (20 mg total) by mouth 2 (two) times daily. 03/26/13   Calvert CantorSaima Rizwan, MD  guaiFENesin-codeine 100-10 MG/5ML syrup Take 5 mLs by mouth 3 (three) times daily as needed for cough. 05/20/13   Jeanann Lewandowskylugbemiga Maciel Kegg, MD  ibuprofen (ADVIL,MOTRIN) 100 MG tablet Take 500 mg by mouth every 6 (six) hours as needed for fever.    Historical Provider, MD  indomethacin (INDOCIN) 25 MG capsule Take 25 mg by mouth 3 (three) times daily with meals.    Historical  Provider, MD  naproxen (NAPROSYN) 500 MG tablet Take 1 tablet (500 mg total) by mouth 2 (two) times daily. 03/26/13   Calvert CantorSaima Rizwan, MD  oseltamivir (TAMIFLU) 75 MG capsule Take 1 capsule (75 mg total) by mouth 2 (two) times daily. 05/20/13   Jeanann Lewandowskylugbemiga Laray Corbit, MD  oxyCODONE-acetaminophen (PERCOCET/ROXICET) 5-325 MG per tablet Take 1-2 tablets by mouth every 6 (six) hours as needed for pain. 02/26/13   Santiago GladHeather Laisure, PA-C     Objective:   Filed Vitals:   05/20/13 1443  BP: 118/74  Pulse: 73  Temp: 97.9 F (36.6 C)  TempSrc: Oral  Resp: 16  Height: 4\' 11"  (1.499 m)  Weight: 143 lb (64.864 kg)  SpO2: 98%    Exam General appearance : Awake, alert, not in any distress. Speech Clear. Not toxic looking HEENT: Nasal congestion. Hyperemic tonsils bilateral. No exudate. Atraumatic and Normocephalic, pupils equally reactive to light and accomodation Neck: supple, no JVD. No cervical lymphadenopathy.  Chest:Good air entry bilaterally, no added sounds  CVS: S1 S2 regular, no murmurs.  Abdomen: Bowel sounds present, Non tender and not distended with no gaurding, rigidity or rebound. Extremities: B/L Lower Ext shows no edema, both legs are warm to touch Neurology: Awake alert, and oriented X 3, CN II-XII intact, Non focal Skin:No Rash Wounds:N/A   Data Review   CBC No results found for this basename: WBC, HGB, HCT, PLT, MCV, MCH, MCHC, RDW, NEUTRABS, LYMPHSABS, MONOABS, EOSABS, BASOSABS, BANDABS,  BANDSABD,  in the last 168 hours  Chemistries   No results found for this basename: NA, K, CL, CO2, GLUCOSE, BUN, CREATININE, GFRCGP, CALCIUM, MG, AST, ALT, ALKPHOS, BILITOT,  in the last 168 hours ------------------------------------------------------------------------------------------------------------------ No results found for this basename: HGBA1C,  in the last 72 hours ------------------------------------------------------------------------------------------------------------------ No  results found for this basename: CHOL, HDL, LDLCALC, TRIG, CHOLHDL, LDLDIRECT,  in the last 72 hours ------------------------------------------------------------------------------------------------------------------ No results found for this basename: TSH, T4TOTAL, FREET3, T3FREE, THYROIDAB,  in the last 72 hours ------------------------------------------------------------------------------------------------------------------ No results found for this basename: VITAMINB12, FOLATE, FERRITIN, TIBC, IRON, RETICCTPCT,  in the last 72 hours  Coagulation profile  No results found for this basename: INR, PROTIME,  in the last 168 hours    Assessment & Plan   1. Influenza with respiratory manifestations  - oseltamivir (TAMIFLU) 75 MG capsule; Take 1 capsule (75 mg total) by mouth 2 (two) times daily.  Dispense: 10 capsule; Refill: 0 - guaiFENesin-codeine 100-10 MG/5ML syrup; Take 5 mLs by mouth 3 (three) times daily as needed for cough.  Dispense: 120 mL; Refill: 0  2. Acute tonsillitis  - azithromycin (ZITHROMAX Z-PAK) 250 MG tablet; Take 2 today, then 1 daily for 4 days  Dispense: 6 each; Refill: 0  Patient was counseled extensively on nutrition and exercise Patient was counseled on symptomatic treatment including liberal oral liquid intake  Follow up in 3 months or when necessary   The patient was given clear instructions to go to ER or return to medical center if symptoms don't improve, worsen or new problems develop. The patient verbalized understanding. The patient was told to call to get lab results if they haven't heard anything in the next week.    Jeanann Lewandowsky, MD, MHA, FACP, FAAP [facility] [place], [place] [phone]   05/20/2013, 3:14 PM

## 2013-08-06 ENCOUNTER — Ambulatory Visit: Payer: No Typology Code available for payment source | Attending: Internal Medicine | Admitting: Internal Medicine

## 2013-08-06 VITALS — BP 99/62 | HR 63 | Temp 98.0°F | Resp 16 | Ht 60.0 in | Wt 139.0 lb

## 2013-08-06 DIAGNOSIS — N63 Unspecified lump in unspecified breast: Secondary | ICD-10-CM

## 2013-08-06 NOTE — Progress Notes (Signed)
Patient ID: Marissa Garcia, female   DOB: June 20, 1965, 48 y.o.   MRN: [id number]   CC:  HPI: Patient concerned about an enlarged lymph node in her right axilla which she noticed a couple of weeks ago. Now the lump has disappeared.. And breast pain bilaterally before her menstrual cycle. She does shave her underarms. She is requesting a mammogram immediately.   No Known Allergies Past Medical History  Diagnosis Date  . Constipation   . Hypercholesteremia    Current Outpatient Prescriptions on File Prior to Visit  Medication Sig Dispense Refill  . acetaminophen (TYLENOL) 500 MG tablet Take 500 mg by mouth every 6 (six) hours as needed for pain.      Marland Kitchen azithromycin (ZITHROMAX Z-PAK) 250 MG tablet Take 2 today, then 1 daily for 4 days  6 each  0  . cyclobenzaprine (FLEXERIL) 10 MG tablet Take 10 mg by mouth at bedtime as needed for muscle spasms (muscle spasms).      . famotidine (PEPCID) 20 MG tablet Take 1 tablet (20 mg total) by mouth 2 (two) times daily.  60 tablet  6  . guaiFENesin-codeine 100-10 MG/5ML syrup Take 5 mLs by mouth 3 (three) times daily as needed for cough.  120 mL  0  . ibuprofen (ADVIL,MOTRIN) 100 MG tablet Take 500 mg by mouth every 6 (six) hours as needed for fever.      . indomethacin (INDOCIN) 25 MG capsule Take 25 mg by mouth 3 (three) times daily with meals.      . naproxen (NAPROSYN) 500 MG tablet Take 1 tablet (500 mg total) by mouth 2 (two) times daily.  30 tablet  3  . oseltamivir (TAMIFLU) 75 MG capsule Take 1 capsule (75 mg total) by mouth 2 (two) times daily.  10 capsule  0  . oxyCODONE-acetaminophen (PERCOCET/ROXICET) 5-325 MG per tablet Take 1-2 tablets by mouth every 6 (six) hours as needed for pain.  20 tablet  0   No current facility-administered medications on file prior to visit.   Family History  Problem Relation Age of Onset  . Hypertension Father    History   Social History  . Marital Status: Single    Spouse Name: N/A    Number of Children:  N/A  . Years of Education: N/A   Occupational History  . Not on file.   Social History Main Topics  . Smoking status: Never Smoker   . Smokeless tobacco: Not on file  . Alcohol Use: No  . Drug Use: No  . Sexual Activity: Not on file   Other Topics Concern  . Not on file   Social History Narrative  . No narrative on file    Review of Systems  Constitutional: Negative for fever, chills, diaphoresis, activity change, appetite change and fatigue.  HENT: Negative for ear pain, nosebleeds, congestion, facial swelling, rhinorrhea, neck pain, neck stiffness and ear discharge.   Eyes: Negative for pain, discharge, redness, itching and visual disturbance.  Respiratory: Negative for cough, choking, chest tightness, shortness of breath, wheezing and stridor.   Cardiovascular: Negative for chest pain, palpitations and leg swelling.  Gastrointestinal: Negative for abdominal distention.  Genitourinary: Negative for dysuria, urgency, frequency, hematuria, flank pain, decreased urine volume, difficulty urinating and dyspareunia.  Musculoskeletal: Negative for back pain, joint swelling, arthralgias and gait problem.  Neurological: Negative for dizziness, tremors, seizures, syncope, facial asymmetry, speech difficulty, weakness, light-headedness, numbness and headaches.  Hematological: Negative for adenopathy. Does not bruise/bleed easily.  Psychiatric/Behavioral: Negative for  hallucinations, behavioral problems, confusion, dysphoric mood, decreased concentration and agitation.    Objective:   Filed Vitals:   08/06/13 1708  BP: 99/62  Pulse: 63  Temp: 98 F (36.7 C)  Resp: 16    Physical Exam  Constitutional: Appears well-developed and well-nourished. No distress.  HENT: Normocephalic. External right and left ear normal. Oropharynx is clear and moist.  Eyes: Conjunctivae and EOM are normal. PERRLA, no scleral icterus.  Neck: Normal ROM. Neck supple. No JVD. No tracheal deviation. No  thyromegaly.  CVS: RRR, S1/S2 +, no murmurs, no gallops, no carotid bruit.  Pulmonary: Effort and breath sounds normal, no stridor, rhonchi, wheezes, rales.  Abdominal: Soft. BS +,  no distension, tenderness, rebound or guarding.  Musculoskeletal: Normal range of motion. No edema and no tenderness.  Lymphadenopathy: No lymphadenopathy noted, cervical, inguinal. Neuro: Alert. Normal reflexes, muscle tone coordination. No cranial nerve deficit. Skin: Skin is warm and dry. No rash noted. Not diaphoretic. No erythema. No pallor.  Psychiatric: Normal mood and affect. Behavior, judgment, thought content normal.   Lab Results  Component Value Date   WBC 11.4* 02/24/2013   HGB 12.2 02/24/2013   HCT 36.0 02/24/2013   MCV 88.2 02/24/2013   PLT 259 02/24/2013   Lab Results  Component Value Date   CREATININE 0.70 02/24/2013   BUN 12 02/24/2013   NA 140 02/24/2013   K 3.6 02/24/2013   CL 108 02/24/2013   CO2 23 05/28/2011    Lab Results  Component Value Date   HGBA1C  Value: 5.6 (NOTE)                                                                       According to the ADA Clinical Practice Recommendations for 2011, when HbA1c is used as a screening test:   >=6.5%   Diagnostic of Diabetes Mellitus           (if abnormal result  is confirmed)  5.7-6.4%   Increased risk of developing Diabetes Mellitus  References:Diagnosis and Classification of Diabetes Mellitus,Diabetes Care,2011,34(Suppl 1):S62-S69 and Standards of Medical Care in         Diabetes - 2011,Diabetes Care,2011,34  (Suppl 1):S11-S61. 02/16/2010   Lipid Panel     Component Value Date/Time   CHOL 208* 03/27/2010 2148   TRIG 90 03/27/2010 2148   HDL 59 03/27/2010 2148   CHOLHDL 3.5 Ratio 03/27/2010 2148   VLDL 18 03/27/2010 2148   LDLCALC 131* 03/27/2010 2148       Assessment and plan:   Patient Active Problem List   Diagnosis Date Noted  . Influenza with respiratory manifestations 05/20/2013  . Acute tonsillitis  05/20/2013  . DENTAL CARIES 07/05/2010  . HEADACHE 03/21/2010  . ANXIETY STATE, UNSPECIFIED 02/27/2010  . ACID REFLUX DISEASE 02/27/2010  . WRIST PAIN, RIGHT 02/27/2010  . DIZZINESS 02/27/2010  . CHEST PAIN 02/15/2010  . HYPERCHOLESTEROLEMIA 05/13/2008   Right axillary enlarged  lymph node, now resolved    This could be a transient inflammation related to shaving her underarms Given that her mammogram was 2 years ago and repeat another one Patient also requesting a full physical She will have to schedule a 30 minute appointment for a full physical    The  patient was given clear instructions to go to ER or return to medical center if symptoms don't improve, worsen or new problems develop. The patient verbalized understanding. The patient was told to call to get any lab results if not heard anything in the next week.

## 2013-08-06 NOTE — Progress Notes (Signed)
Pt reports that after her menstrual cycles she notices lumps under her arm pit. Pt is requesting a MM

## 2013-08-12 ENCOUNTER — Other Ambulatory Visit: Payer: Self-pay | Admitting: Internal Medicine

## 2013-08-12 DIAGNOSIS — R2230 Localized swelling, mass and lump, unspecified upper limb: Secondary | ICD-10-CM

## 2013-08-19 ENCOUNTER — Ambulatory Visit: Payer: No Typology Code available for payment source | Admitting: Internal Medicine

## 2013-08-26 ENCOUNTER — Other Ambulatory Visit: Payer: Self-pay | Admitting: Obstetrics and Gynecology

## 2013-08-26 DIAGNOSIS — N63 Unspecified lump in unspecified breast: Secondary | ICD-10-CM

## 2013-08-26 DIAGNOSIS — R2230 Localized swelling, mass and lump, unspecified upper limb: Secondary | ICD-10-CM

## 2013-08-27 ENCOUNTER — Ambulatory Visit
Admission: RE | Admit: 2013-08-27 | Discharge: 2013-08-27 | Disposition: A | Discharge status: Home / Self Care | Payer: No Typology Code available for payment source | Source: Ambulatory Visit | Attending: Internal Medicine | Admitting: Internal Medicine

## 2013-08-27 ENCOUNTER — Other Ambulatory Visit: Payer: No Typology Code available for payment source

## 2013-08-27 ENCOUNTER — Ambulatory Visit: Admission: RE | Admit: 2013-08-27 | Payer: No Typology Code available for payment source | Source: Ambulatory Visit

## 2013-08-27 ENCOUNTER — Other Ambulatory Visit: Payer: Self-pay | Admitting: Internal Medicine

## 2013-08-27 DIAGNOSIS — R2230 Localized swelling, mass and lump, unspecified upper limb: Secondary | ICD-10-CM

## 2013-08-27 DIAGNOSIS — N63 Unspecified lump in unspecified breast: Secondary | ICD-10-CM

## 2013-09-29 ENCOUNTER — Encounter: Payer: Self-pay | Admitting: Obstetrics & Gynecology

## 2013-11-01 ENCOUNTER — Ambulatory Visit: Payer: No Typology Code available for payment source | Attending: Internal Medicine | Admitting: Internal Medicine

## 2013-11-01 ENCOUNTER — Other Ambulatory Visit (HOSPITAL_COMMUNITY)
Admission: RE | Admit: 2013-11-01 | Discharge: 2013-11-01 | Disposition: A | Discharge status: Home / Self Care | Payer: No Typology Code available for payment source | Source: Ambulatory Visit | Attending: Internal Medicine | Admitting: Internal Medicine

## 2013-11-01 ENCOUNTER — Encounter: Payer: Self-pay | Admitting: Internal Medicine

## 2013-11-01 VITALS — BP 106/66 | HR 62 | Temp 98.2°F | Resp 16 | Ht 59.0 in | Wt 139.0 lb

## 2013-11-01 DIAGNOSIS — Z0000 Encounter for general adult medical examination without abnormal findings: Secondary | ICD-10-CM

## 2013-11-01 DIAGNOSIS — E785 Hyperlipidemia, unspecified: Secondary | ICD-10-CM | POA: Insufficient documentation

## 2013-11-01 DIAGNOSIS — Z124 Encounter for screening for malignant neoplasm of cervix: Secondary | ICD-10-CM | POA: Insufficient documentation

## 2013-11-01 DIAGNOSIS — F411 Generalized anxiety disorder: Secondary | ICD-10-CM | POA: Insufficient documentation

## 2013-11-01 DIAGNOSIS — Z01419 Encounter for gynecological examination (general) (routine) without abnormal findings: Secondary | ICD-10-CM | POA: Insufficient documentation

## 2013-11-01 DIAGNOSIS — N760 Acute vaginitis: Secondary | ICD-10-CM | POA: Insufficient documentation

## 2013-11-01 DIAGNOSIS — N951 Menopausal and female climacteric states: Secondary | ICD-10-CM

## 2013-11-01 DIAGNOSIS — Z113 Encounter for screening for infections with a predominantly sexual mode of transmission: Secondary | ICD-10-CM | POA: Insufficient documentation

## 2013-11-01 DIAGNOSIS — Z79899 Other long term (current) drug therapy: Secondary | ICD-10-CM | POA: Insufficient documentation

## 2013-11-01 LAB — CBC WITH DIFFERENTIAL/PLATELET
Basophils Absolute: 0 10*3/uL (ref 0.0–0.1)
Basophils Relative: 0 % (ref 0–1)
Eosinophils Absolute: 0.2 10*3/uL (ref 0.0–0.7)
Eosinophils Relative: 3 % (ref 0–5)
HCT: 35.4 % — ABNORMAL LOW (ref 36.0–46.0)
Hemoglobin: 12.2 g/dL (ref 12.0–15.0)
LYMPHS ABS: 2 10*3/uL (ref 0.7–4.0)
Lymphocytes Relative: 33 % (ref 12–46)
MCH: 30.7 pg (ref 26.0–34.0)
MCHC: 34.5 g/dL (ref 30.0–36.0)
MCV: 89.2 fL (ref 78.0–100.0)
Monocytes Absolute: 0.4 10*3/uL (ref 0.1–1.0)
Monocytes Relative: 7 % (ref 3–12)
NEUTROS ABS: 3.4 10*3/uL (ref 1.7–7.7)
NEUTROS PCT: 57 % (ref 43–77)
Platelets: 272 10*3/uL (ref 150–400)
RBC: 3.97 MIL/uL (ref 3.87–5.11)
RDW: 14.3 % (ref 11.5–15.5)
WBC: 6 10*3/uL (ref 4.0–10.5)

## 2013-11-01 MED ORDER — ESTROGENS, CONJUGATED 0.625 MG/GM VA CREA: 1.0000 | TOPICAL_CREAM | Freq: Every day | VAGINAL | Status: DC

## 2013-11-01 NOTE — Progress Notes (Signed)
Patient ID: Marissa Garcia, female   DOB: 27-Dec-1965, 48 y.o.   MRN: [id number]   Marissa Garcia, is a 48 y.o. female  [id number]  [id number]  DOB - 27-Dec-1965  Chief Complaint  Patient presents with  . Follow-up        Subjective:   Marissa Garcia is a 48 y.o. female here today for a follow up visit. Patient has no significant past medical history except for dyslipidemia and generalized anxiety disorder. She is here today for her annual physical examination. She has no complaint today. She also wants a Pap smear done. There is no personal or family history of breast cancer or cervical cancer. Patient does not smoke cigarette she does not drink alcohol.  Patient has No headache, No chest pain, No abdominal pain - No Nausea, No new weakness tingling or numbness, No Cough - SOB.  Problem  Annual Physical Exam  Pap Smear for Cervical Cancer Screening    ALLERGIES: No Known Allergies  PAST MEDICAL HISTORY: Past Medical History  Diagnosis Date  . Constipation   . Hypercholesteremia     MEDICATIONS AT HOME: Prior to Admission medications   Medication Sig Start Date End Date Taking? Authorizing Provider  acetaminophen (TYLENOL) 500 MG tablet Take 500 mg by mouth every 6 (six) hours as needed for pain.    Historical Provider, MD  azithromycin (ZITHROMAX Z-PAK) 250 MG tablet Take 2 today, then 1 daily for 4 days 05/20/13   Jeanann Lewandowskylugbemiga Jegede, MD  cyclobenzaprine (FLEXERIL) 10 MG tablet Take 10 mg by mouth at bedtime as needed for muscle spasms (muscle spasms).    Historical Provider, MD  famotidine (PEPCID) 20 MG tablet Take 1 tablet (20 mg total) by mouth 2 (two) times daily. 03/26/13   Calvert CantorSaima Rizwan, MD  guaiFENesin-codeine 100-10 MG/5ML syrup Take 5 mLs by mouth 3 (three) times daily as needed for cough. 05/20/13   Jeanann Lewandowskylugbemiga Jegede, MD  ibuprofen (ADVIL,MOTRIN) 100 MG tablet Take 500 mg by mouth every 6 (six) hours as needed for fever.    Historical Provider, MD  indomethacin (INDOCIN)  25 MG capsule Take 25 mg by mouth 3 (three) times daily with meals.    Historical Provider, MD  naproxen (NAPROSYN) 500 MG tablet Take 1 tablet (500 mg total) by mouth 2 (two) times daily. 03/26/13   Calvert CantorSaima Rizwan, MD  oseltamivir (TAMIFLU) 75 MG capsule Take 1 capsule (75 mg total) by mouth 2 (two) times daily. 05/20/13   Jeanann Lewandowskylugbemiga Jegede, MD  oxyCODONE-acetaminophen (PERCOCET/ROXICET) 5-325 MG per tablet Take 1-2 tablets by mouth every 6 (six) hours as needed for pain. 02/26/13   Santiago GladHeather Laisure, PA-C     Objective:   Filed Vitals:   11/01/13 1630  BP: 106/66  Pulse: 62  Temp: 98.2 F (36.8 C)  TempSrc: Oral  Resp: 16  Height: 4\' 11"  (1.499 m)  Weight: 139 lb (63.05 kg)  SpO2: 98%    Exam General appearance : Awake, alert, not in any distress. Speech Clear. Not toxic looking HEENT: Atraumatic and Normocephalic, pupils equally reactive to light and accomodation Neck: supple, no JVD. No cervical lymphadenopathy.  Chest:Good air entry bilaterally, no added sounds  CVS: S1 S2 regular, no murmurs.  Abdomen: Bowel sounds present, Non tender and not distended with no gaurding, rigidity or rebound. Extremities: B/L Lower Ext shows no edema, both legs are warm to touch Neurology: Awake alert, and oriented X 3, CN II-XII intact, Non focal Pelvic examination: Normal female external genitalia, central cervix, no contact bleeding,  negative cervical motion tenderness, uterus appears normal in size  Data Review Lab Results  Component Value Date   HGBA1C  Value: 5.6 (NOTE)                                                                       According to the ADA Clinical Practice Recommendations for 2011, when HbA1c is used as a screening test:   >=6.5%   Diagnostic of Diabetes Mellitus           (if abnormal result  is confirmed)  5.7-6.4%   Increased risk of developing Diabetes Mellitus  References:Diagnosis and Classification of Diabetes Mellitus,Diabetes Care,2011,34(Suppl 1):S62-S69 and  Standards of Medical Care in         Diabetes - 2011,Diabetes Care,2011,34  (Suppl 1):S11-S61. 02/16/2010   HGBA1C  Value: 5.6 (NOTE)                                                                       According to the ADA Clinical Practice Recommendations for 2011, when HbA1c is used as a screening test:   >=6.5%   Diagnostic of Diabetes Mellitus           (if abnormal result  is confirmed)  5.7-6.4%   Increased risk of developing Diabetes Mellitus  References:Diagnosis and Classification of Diabetes Mellitus,Diabetes Care,2011,34(Suppl 1):S62-S69 and Standards of Medical Care in         Diabetes - 2011,Diabetes Care,2011,34  (Suppl 1):S11-S61. 02/15/2010     Assessment & Plan   1. Annual physical exam  - CBC with Differential - COMPLETE METABOLIC PANEL WITH GFR - POCT glycosylated hemoglobin (Hb A1C) - Lipid panel - TSH - Urinalysis, Complete - Vit D  25 hydroxy (rtn osteoporosis monitoring)  2. Pap smear for cervical cancer screening  - Cytology - PAP - Cervicovaginal ancillary only  Patient was extensively counseled on nutrition and exercise  Return in about 6 months (around 05/03/2014), or if symptoms worsen or fail to improve, for Routine Follow Up.  The patient was given clear instructions to go to ER or return to medical center if symptoms don't improve, worsen or new problems develop. The patient verbalized understanding. The patient was told to call to get lab results if they haven't heard anything in the next week.   This note has been created with Education officer, environmentalDragon speech recognition software and smart phrase technology. Any transcriptional errors are unintentional.    Jeanann LewandowskyJEGEDE, OLUGBEMIGA, MD, MHA, FACP, FAAP [facility] [place], [place] [phone]   11/01/2013, 5:04 PM

## 2013-11-01 NOTE — Progress Notes (Signed)
Pt states that she is still having lower back pain. Pt reports that she eats and a hour later she gets hungry. Pt is here to have a pap smear and a physical.

## 2013-11-02 ENCOUNTER — Ambulatory Visit: Payer: No Typology Code available for payment source | Attending: Internal Medicine

## 2013-11-02 LAB — URINALYSIS, COMPLETE
BILIRUBIN URINE: NEGATIVE
Bacteria, UA: NONE SEEN
CRYSTALS: NONE SEEN
Casts: NONE SEEN
Glucose, UA: NEGATIVE mg/dL
Hgb urine dipstick: NEGATIVE
Ketones, ur: NEGATIVE mg/dL
Leukocytes, UA: NEGATIVE
NITRITE: NEGATIVE
PROTEIN: NEGATIVE mg/dL
SPECIFIC GRAVITY, URINE: 1.017 (ref 1.005–1.030)
Squamous Epithelial / LPF: NONE SEEN
UROBILINOGEN UA: 0.2 mg/dL (ref 0.0–1.0)
pH: 6 (ref 5.0–8.0)

## 2013-11-02 LAB — LIPID PANEL
Cholesterol: 209 mg/dL — ABNORMAL HIGH (ref 0–200)
HDL: 54 mg/dL (ref >39)
LDL CALC: 123 mg/dL — AB (ref 0–99)
Total CHOL/HDL Ratio: 3.9 Ratio
Triglycerides: 161 mg/dL — ABNORMAL HIGH (ref <150)
VLDL: 32 mg/dL (ref 0–40)

## 2013-11-02 LAB — COMPLETE METABOLIC PANEL WITH GFR
ALT: 9 U/L (ref 0–35)
AST: 12 U/L (ref 0–37)
Albumin: 4.2 g/dL (ref 3.5–5.2)
Alkaline Phosphatase: 38 U/L — ABNORMAL LOW (ref 39–117)
BUN: 15 mg/dL (ref 6–23)
CALCIUM: 9 mg/dL (ref 8.4–10.5)
CHLORIDE: 101 meq/L (ref 96–112)
CO2: 27 meq/L (ref 19–32)
Creat: 0.61 mg/dL (ref 0.50–1.10)
GFR, Est Non African American: >89 mL/min
Glucose, Bld: 83 mg/dL (ref 70–99)
Potassium: 4 mEq/L (ref 3.5–5.3)
SODIUM: 133 meq/L — AB (ref 135–145)
TOTAL PROTEIN: 7 g/dL (ref 6.0–8.3)
Total Bilirubin: 0.3 mg/dL (ref 0.2–1.2)

## 2013-11-02 LAB — TSH: TSH: 1.523 u[IU]/mL (ref 0.350–4.500)

## 2013-11-02 LAB — VITAMIN D 25 HYDROXY (VIT D DEFICIENCY, FRACTURES): Vit D, 25-Hydroxy: 33 ng/mL (ref 30–89)

## 2013-11-03 LAB — CYTOLOGY - PAP

## 2013-11-05 ENCOUNTER — Telehealth: Payer: Self-pay | Admitting: Emergency Medicine

## 2013-11-05 MED ORDER — FLUCONAZOLE 150 MG PO TABS: 150.0000 mg | ORAL_TABLET | Freq: Once | ORAL | Status: DC

## 2013-11-05 NOTE — Telephone Encounter (Signed)
Message copied by Darlis LoanSMITH, Sharone Picchi D on Fri Nov 05, 2013  4:17 PM ------      Message from: Jeanann LewandowskyJEGEDE, OLUGBEMIGA E      Created: Thu Nov 04, 2013  2:43 PM       Please inform patient that her blood tests results are mostly within normal limit except for her cholesterol, its still high. We strongly advised low-cholesterol low-fat diet and physical exercise at least 3 times a week 30 minutes each time.      Please inform patient that her Pap smear result is normal but she has evidence of infection with yeast, not a sexually transmitted disease, easily treatable with medication.            Please call in prescription Diflucan 200 mg tablet by mouth once, repeat in one week ------

## 2013-11-05 NOTE — Telephone Encounter (Signed)
Pt given lab results with medication instructions to adhere to low fat diet with exercising 3 times/week Pap smear results given with instructions to start taking Diflucan 150 mg tab once then repeat in 1 week

## 2013-11-20 ENCOUNTER — Emergency Department (HOSPITAL_COMMUNITY)
Admission: EM | Admit: 2013-11-20 | Discharge: 2013-11-20 | Disposition: A | Discharge status: Home / Self Care | Payer: No Typology Code available for payment source | Attending: Emergency Medicine | Admitting: Emergency Medicine

## 2013-11-20 ENCOUNTER — Emergency Department (HOSPITAL_COMMUNITY): Payer: No Typology Code available for payment source

## 2013-11-20 ENCOUNTER — Encounter (HOSPITAL_COMMUNITY): Payer: Self-pay | Admitting: Emergency Medicine

## 2013-11-20 DIAGNOSIS — R200 Anesthesia of skin: Secondary | ICD-10-CM

## 2013-11-20 DIAGNOSIS — R209 Unspecified disturbances of skin sensation: Secondary | ICD-10-CM | POA: Insufficient documentation

## 2013-11-20 DIAGNOSIS — Z79899 Other long term (current) drug therapy: Secondary | ICD-10-CM | POA: Insufficient documentation

## 2013-11-20 DIAGNOSIS — R42 Dizziness and giddiness: Secondary | ICD-10-CM | POA: Insufficient documentation

## 2013-11-20 DIAGNOSIS — Z8719 Personal history of other diseases of the digestive system: Secondary | ICD-10-CM | POA: Insufficient documentation

## 2013-11-20 DIAGNOSIS — Z792 Long term (current) use of antibiotics: Secondary | ICD-10-CM | POA: Insufficient documentation

## 2013-11-20 DIAGNOSIS — Z8639 Personal history of other endocrine, nutritional and metabolic disease: Secondary | ICD-10-CM | POA: Insufficient documentation

## 2013-11-20 DIAGNOSIS — R55 Syncope and collapse: Secondary | ICD-10-CM | POA: Insufficient documentation

## 2013-11-20 DIAGNOSIS — Z791 Long term (current) use of non-steroidal anti-inflammatories (NSAID): Secondary | ICD-10-CM | POA: Insufficient documentation

## 2013-11-20 DIAGNOSIS — Z862 Personal history of diseases of the blood and blood-forming organs and certain disorders involving the immune mechanism: Secondary | ICD-10-CM | POA: Insufficient documentation

## 2013-11-20 LAB — PROTIME-INR
INR: 1.01 (ref 0.00–1.49)
Prothrombin Time: 13.3 seconds (ref 11.6–15.2)

## 2013-11-20 LAB — COMPREHENSIVE METABOLIC PANEL
ALBUMIN: 3.6 g/dL (ref 3.5–5.2)
ALT: 13 U/L (ref 0–35)
ANION GAP: 10 (ref 5–15)
AST: 16 U/L (ref 0–37)
Alkaline Phosphatase: 49 U/L (ref 39–117)
BUN: 11 mg/dL (ref 6–23)
CALCIUM: 8.9 mg/dL (ref 8.4–10.5)
CHLORIDE: 104 meq/L (ref 96–112)
CO2: 26 mEq/L (ref 19–32)
Creatinine, Ser: 0.69 mg/dL (ref 0.50–1.10)
GFR calc Af Amer: >90 mL/min (ref >90)
GFR calc non Af Amer: >90 mL/min (ref >90)
Glucose, Bld: 102 mg/dL — ABNORMAL HIGH (ref 70–99)
Potassium: 3.6 mEq/L — ABNORMAL LOW (ref 3.7–5.3)
Sodium: 140 mEq/L (ref 137–147)
Total Bilirubin: 0.3 mg/dL (ref 0.3–1.2)
Total Protein: 7.2 g/dL (ref 6.0–8.3)

## 2013-11-20 LAB — CBC WITH DIFFERENTIAL/PLATELET
BASOS ABS: 0 10*3/uL (ref 0.0–0.1)
BASOS PCT: 0 % (ref 0–1)
EOS ABS: 0.2 10*3/uL (ref 0.0–0.7)
EOS PCT: 3 % (ref 0–5)
HEMATOCRIT: 34.9 % — AB (ref 36.0–46.0)
HEMOGLOBIN: 12.1 g/dL (ref 12.0–15.0)
Lymphocytes Relative: 34 % (ref 12–46)
Lymphs Abs: 2.1 10*3/uL (ref 0.7–4.0)
MCH: 31.3 pg (ref 26.0–34.0)
MCHC: 34.7 g/dL (ref 30.0–36.0)
MCV: 90.4 fL (ref 78.0–100.0)
MONOS PCT: 6 % (ref 3–12)
Monocytes Absolute: 0.4 10*3/uL (ref 0.1–1.0)
NEUTROS ABS: 3.5 10*3/uL (ref 1.7–7.7)
Neutrophils Relative %: 57 % (ref 43–77)
Platelets: 283 10*3/uL (ref 150–400)
RBC: 3.86 MIL/uL — ABNORMAL LOW (ref 3.87–5.11)
RDW: 13.4 % (ref 11.5–15.5)
WBC: 6.1 10*3/uL (ref 4.0–10.5)

## 2013-11-20 LAB — TROPONIN I

## 2013-11-20 LAB — APTT: aPTT: 30 seconds (ref 24–37)

## 2013-11-20 LAB — CBG MONITORING, ED: GLUCOSE-CAPILLARY: 123 mg/dL — AB (ref 70–99)

## 2013-11-20 IMAGING — CT CT HEAD W/O CM
1 series · 16 of 30 positions shown, 20 images · non-contrast
Comparison: None.

CLINICAL DATA: Left-sided facial numbness

EXAM:
CT HEAD WITHOUT CONTRAST
TECHNIQUE: Contiguous axial images were obtained from the base of the skull
through the vertex without intravenous contrast.

[Series 2: headtrauma 4.8 h37s · axial · 0.43mm/px · z∈[+112,+243]mm · 16 of 30 slices shown, 20 images]
[im 2/30  brain]
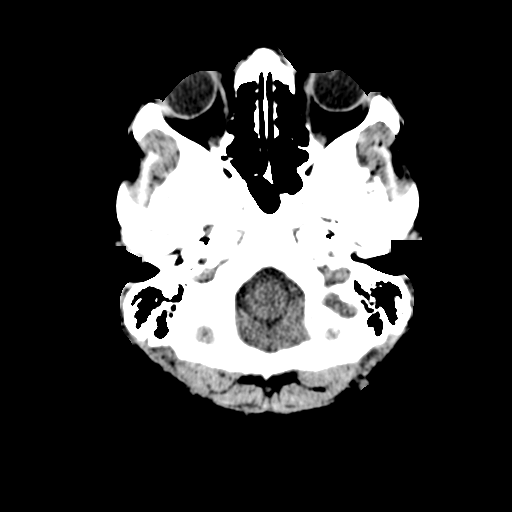
[im 2/30  bone]
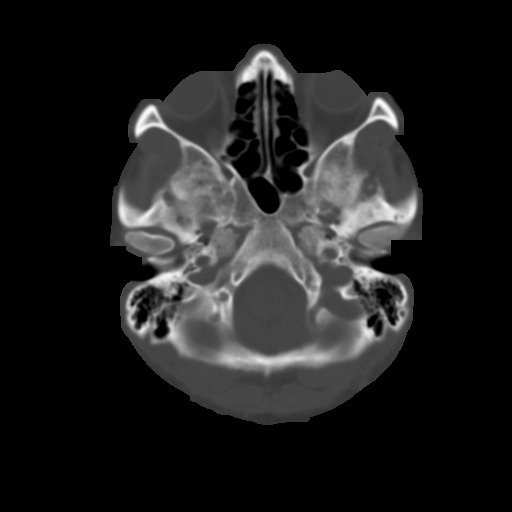
[im 4/30  brain]
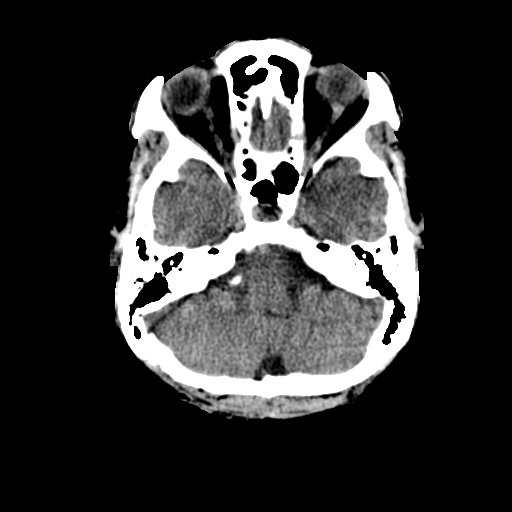
[im 6/30  brain]
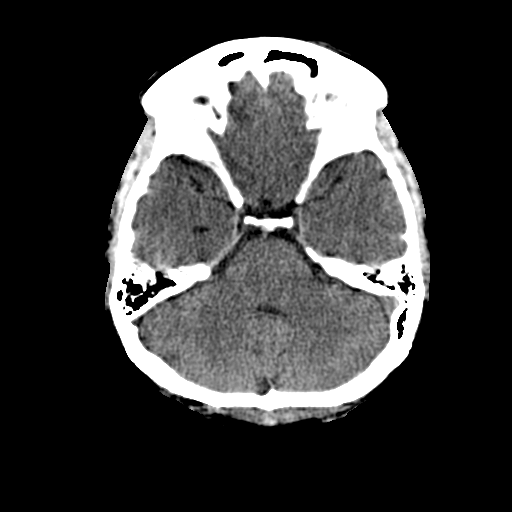
[im 8/30  brain]
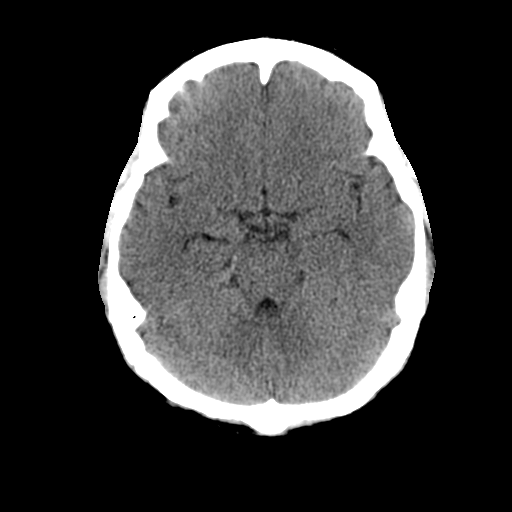
[im 9/30  brain]
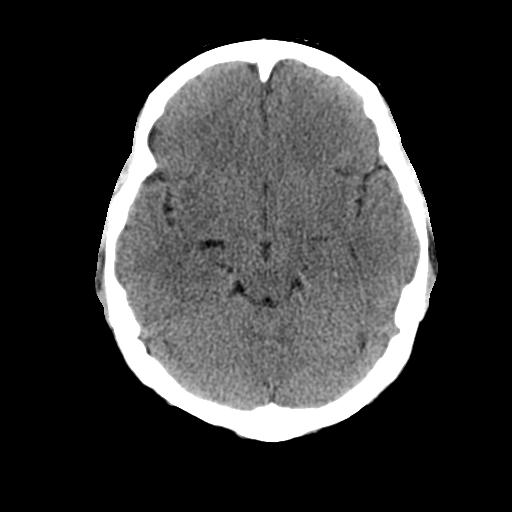
[im 9/30  bone]
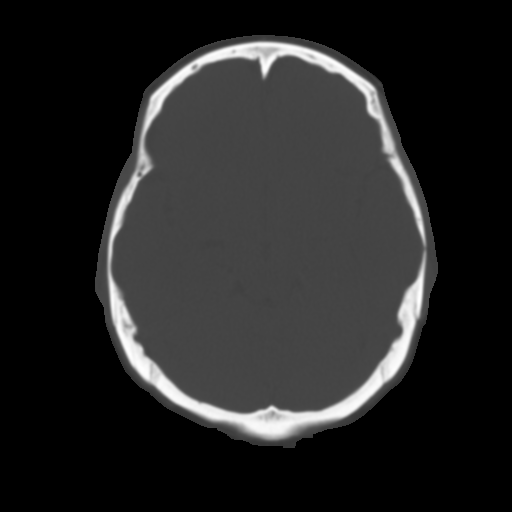
[im 11/30  brain]
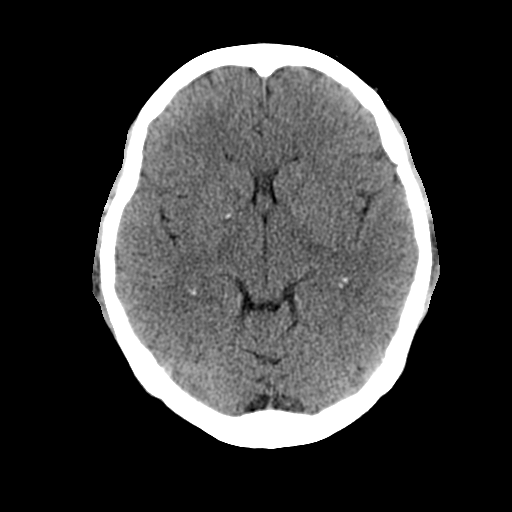
[im 13/30  brain]
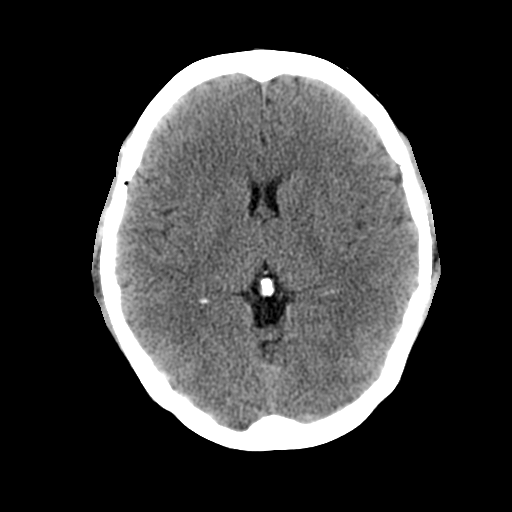
[im 15/30  brain]
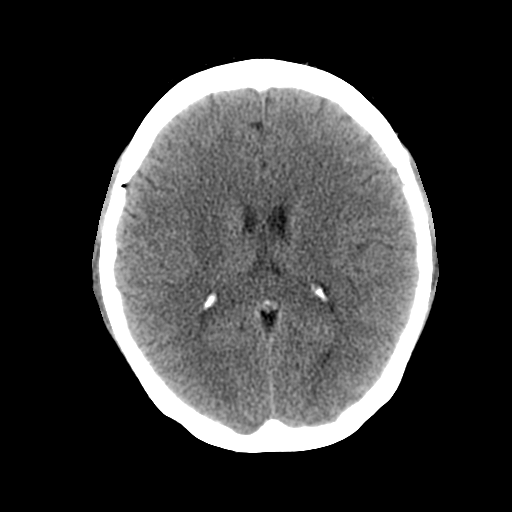
[im 16/30  brain]
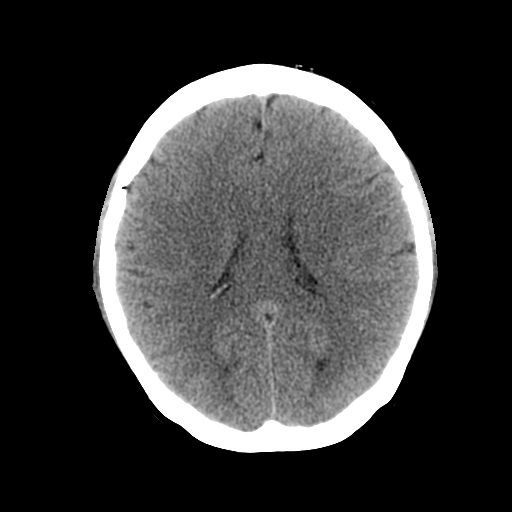
[im 16/30  bone]
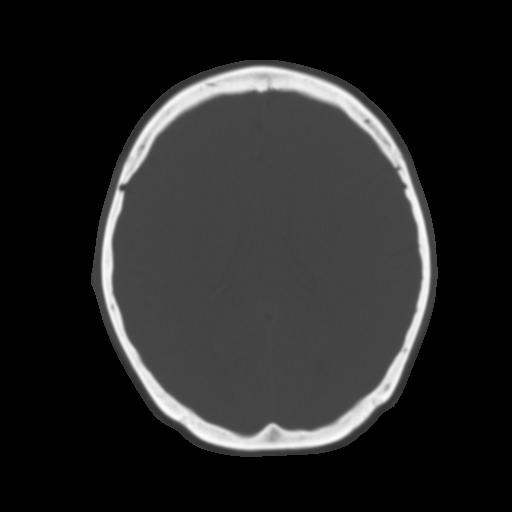
[im 18/30  brain]
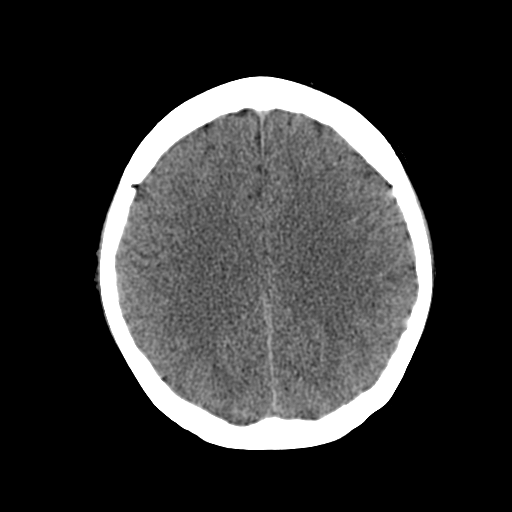
[im 20/30  brain]
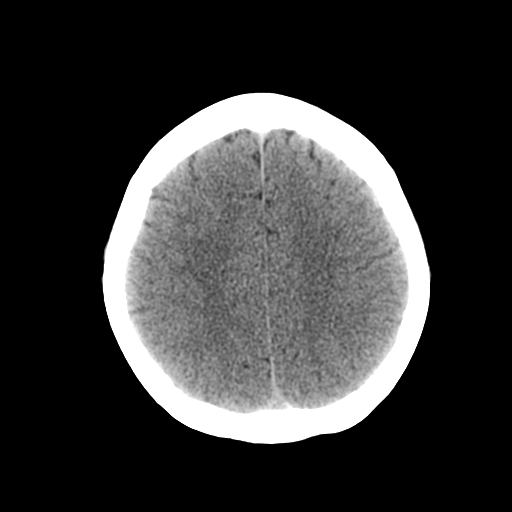
[im 22/30  brain]
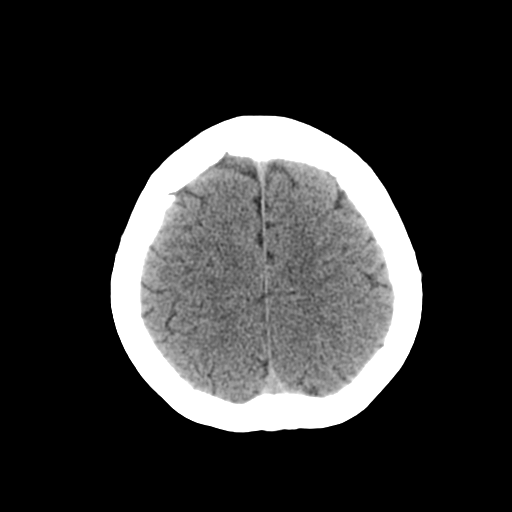
[im 23/30  brain]
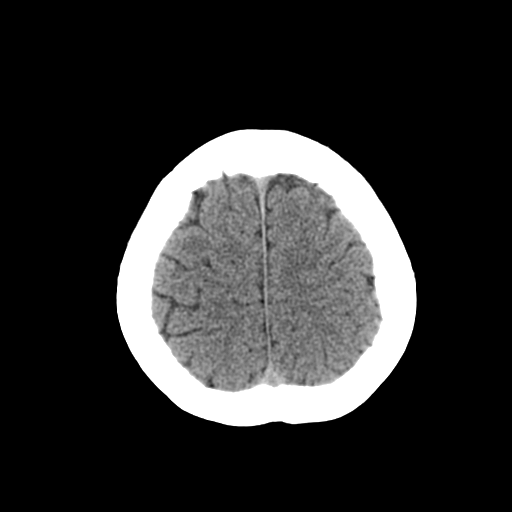
[im 23/30  bone]
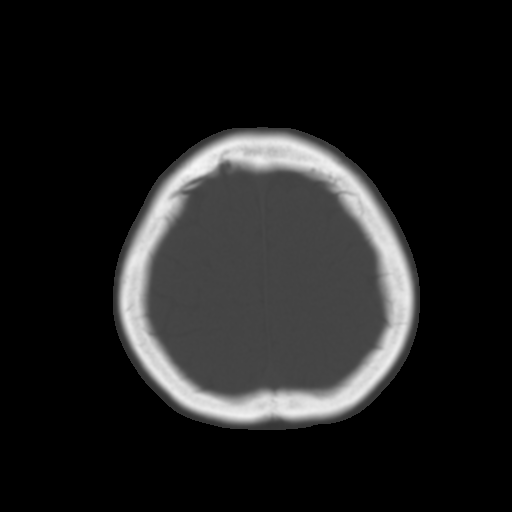
[im 25/30  brain]
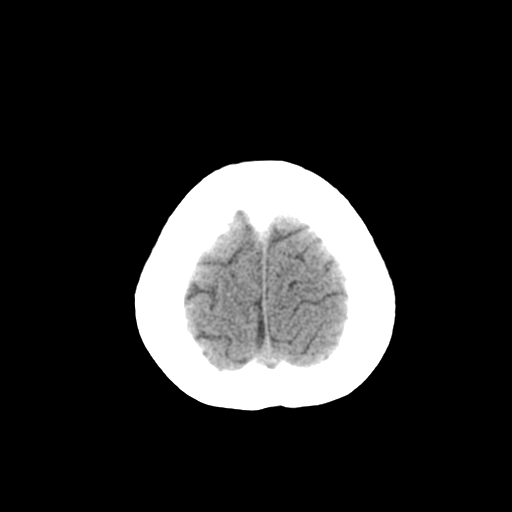
[im 27/30  brain]
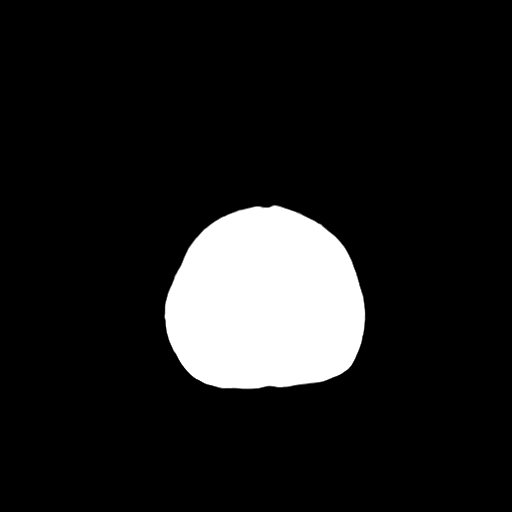
[im 29/30  brain]
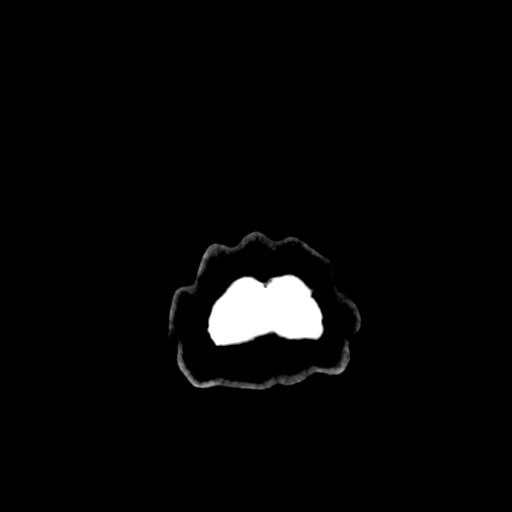

[16 of 30 positions shown; findings below may reference images not displayed]

FINDINGS: No acute intracranial hemorrhage. No focal mass lesion. No CT
evidence of acute infarction. No midline shift or mass effect. No
hydrocephalus. Dystrophic calcifications in the right basal ganglia.
Basilar cisterns are patent. Paranasal sinuses and mastoid air cells
are clear.
IMPRESSION: Normal head CT.

## 2013-11-20 NOTE — Discharge Instructions (Signed)
Drink plenty of fluids today. Drink a 32 ounce bottle of gatorade tonight and again in the morning. Try to rest today and sleep well tonight. Start taking a baby aspirin 81 mg every day. Have your doctor recheck you this week for further evaluation of your intermittent facial numbness.   Return to the ED if you feel worse again.

## 2013-11-20 NOTE — ED Provider Notes (Signed)
CSN: [id number]     Arrival date & time 11/20/13  1505 History   First MD Initiated Contact with Patient 11/20/13 1521     This chart was scribed for Ward [name], MD by Arlan OrganAshley Leger, ED Scribe. This patient was seen in room APA09/APA09 and the patient's care was started 3:40 PM.   Chief Complaint  Patient presents with  . Loss of Consciousness   HPI  HPI Comments: Marissa GreenhouseSara Garcia is a dominant R handed 48 y.o. female with a PMHx of Hypercholesteremia who presents to the Emergency Department complaining of LOC with associated dizziness onset today while standing for a long period of time. She reports she felt tired and weak today which is not unusual. 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 She states she started feeling dizzy and her vision started getting blurred and then she passed out rapidly. She denies any nausea or vomiting. She states when she woke up she had numbness of her left arm and leg. After she passed out she had a brief episode of chest pain. She denies any diaphoresis. States she did not hit her head as someone caught her before landing on the ground. States after fall she experienced numbness to the L forearm and mild CP that has now resolved. States after initial syncopal episode, she felt as though a second episode was coming on. She states she does not remember many details before and during episode. Pt also admits to L sided facial numbness onset 8 days ago. States she has also experienced her heart "beating hard", but not racing. Pt is not currently a smoker. She is unaware of any family history of heart or stroke issues. No other concerns this visit. She does express she is under a lot of stress. Patient is right-handed. She has never had this happen before.  [facility] [place]   Past Medical History  Diagnosis Date  . Constipation   . Hypercholesteremia    Past Surgical History  Procedure Laterality Date  . Cesarean section     Family History   Problem Relation Age of Onset  . Hypertension Father    History  Substance Use Topics  . Smoking status: Never Smoker   . Smokeless tobacco: Not on file  . Alcohol Use: No   Going to cosmetology school  OB History   Grav Para Term Preterm Abortions TAB SAB Ect Mult Living                 Review of Systems  Constitutional: Negative for fever and chills.  Neurological: Positive for dizziness, syncope and numbness.      Allergies  Review of patient's allergies indicates no known allergies.  Home Medications   Prior to Admission medications   Medication Sig Start Date End Date Taking? Authorizing Provider  acetaminophen (TYLENOL) 500 MG tablet Take 500 mg by mouth every 6 (six) hours as needed for pain.    Historical Provider, MD  azithromycin (ZITHROMAX Z-PAK) 250 MG tablet Take 2 today, then 1 daily for 4 days 05/20/13   Jeanann Lewandowskylugbemiga Jegede, MD  conjugated estrogens (PREMARIN) vaginal cream Place 1 Applicatorful vaginally daily. 11/01/13   Jeanann Lewandowskylugbemiga Jegede, MD  cyclobenzaprine (FLEXERIL) 10 MG tablet Take 10 mg by mouth at bedtime as needed for muscle spasms (muscle spasms).    Historical Provider, MD  famotidine (PEPCID) 20 MG tablet Take 1 tablet (20 mg total) by mouth 2 (two) times daily. 03/26/13   Calvert CantorSaima Rizwan, MD  fluconazole (DIFLUCAN) 150 MG tablet Take 1 tablet (150 mg total) by mouth once. 11/05/13   Jeanann Lewandowsky, MD  guaiFENesin-codeine 100-10 MG/5ML syrup Take 5 mLs by mouth 3 (three) times daily as needed for cough. 05/20/13   Jeanann Lewandowsky, MD  ibuprofen (ADVIL,MOTRIN) 100 MG tablet Take 500 mg by mouth every 6 (six) hours as needed for fever.    Historical Provider, MD  indomethacin (INDOCIN) 25 MG capsule Take 25 mg by mouth 3 (three) times daily with meals.    Historical Provider, MD  naproxen (NAPROSYN) 500 MG tablet Take 1 tablet (500 mg total) by mouth 2 (two) times daily. 03/26/13   Calvert Cantor, MD  oseltamivir (TAMIFLU) 75 MG capsule Take 1 capsule (75  mg total) by mouth 2 (two) times daily. 05/20/13   Jeanann Lewandowsky, MD  oxyCODONE-acetaminophen (PERCOCET/ROXICET) 5-325 MG per tablet Take 1-2 tablets by mouth every 6 (six) hours as needed for pain. 02/26/13   Santiago Glad, PA-C   Triage Vitals: BP 116/68  Pulse 70  Temp(Src) 97.7 F (36.5 C) (Oral)  Resp 18  SpO2 98%  LMP 11/02/2013   Vital signs normal   16:19 Orthostatic Vital Signs NH Orthostatic Lying - BP- Lying: 118/79 mmHg ; Pulse- Lying: 68  Orthostatic Sitting - BP- Sitting: 129/83 mmHg ; Pulse- Sitting: 80  Orthostatic Standing at 0 minutes - BP- Standing at 0 minutes: 136/85 mmHg ; Pulse- Standing at 0 minutes: 80  Orthostatic vital signs normal  Physical Exam  Nursing note and vitals reviewed. Constitutional: She is oriented to person, place, and time. She appears well-developed and well-nourished.  Non-toxic appearance. She does not appear ill. No distress.  HENT:  Head: Normocephalic and atraumatic.  Right Ear: External ear normal.  Left Ear: External ear normal.  Nose: Nose normal. No mucosal edema or rhinorrhea.  Mouth/Throat: Oropharynx is clear and moist and mucous membranes are normal. No dental abscesses or uvula swelling.  Eyes: Conjunctivae and EOM are normal. Pupils are equal, round, and reactive to light.  Neck: Normal range of motion and full passive range of motion without pain. Neck supple.  Cardiovascular: Normal rate, regular rhythm and normal heart sounds.  Exam reveals no gallop and no friction rub.   No murmur heard. Pulmonary/Chest: Effort normal and breath sounds normal. No respiratory distress. She has no wheezes. She has no rhonchi. She has no rales. She exhibits no tenderness and no crepitus.  Abdominal: Soft. Normal appearance and bowel sounds are normal. She exhibits no distension. There is no tenderness. There is no rebound and no guarding.  Musculoskeletal: Normal range of motion. She exhibits no edema and no tenderness.  Moves all  extremities well.   Neurological: She is alert and oriented to person, place, and time. She has normal strength. No cranial nerve deficit.  No facial asymmetry Equal grip No pronator drift No lower extremity weakness Heel to shin equal although she states it was harder to do  on L  Skin: Skin is warm, dry and intact. No rash noted. No erythema. No pallor.  Psychiatric: She has a normal mood and affect. Her speech is normal and behavior is normal. Her mood appears not anxious.    ED Course  Procedures (including critical care time)  DIAGNOSTIC STUDIES: Oxygen Saturation is 98% on RA, Normal by my interpretation.    COORDINATION OF CARE: 3:48 PM- Will order CT Head w/o contrast, CBC, CMP, Troponin I, APTT, protime-INR, and CBG. Discussed treatment plan with pt at bedside and  pt agreed to plan.    Review of patient's cardiac monitor while she was in the ED shows no irregular rhythms. Patient states when her blood was drawn she felt like she was going to pass out again.  At time of discharge patient states all of her numbness is gone. We discussed starting to take a baby aspirin a day. It is not clear to me whether she is having some atypical TIA symptoms or she is just having a lot of stress with fatigue.   Labs Review Results for orders placed during the hospital encounter of 11/20/13  CBC WITH DIFFERENTIAL      Result Value Ref Range   WBC 6.1  4.0 - 10.5 K/uL   RBC 3.86 (*) 3.87 - 5.11 MIL/uL   Hemoglobin 12.1  12.0 - 15.0 g/dL   HCT 81.1 (*) 91.4 - 78.2 %   MCV 90.4  78.0 - 100.0 fL   MCH 31.3  26.0 - 34.0 pg   MCHC 34.7  30.0 - 36.0 g/dL   RDW 95.6  21.3 - 08.6 %   Platelets 283  150 - 400 K/uL   Neutrophils Relative % 57  43 - 77 %   Neutro Abs 3.5  1.7 - 7.7 K/uL   Lymphocytes Relative 34  12 - 46 %   Lymphs Abs 2.1  0.7 - 4.0 K/uL   Monocytes Relative 6  3 - 12 %   Monocytes Absolute 0.4  0.1 - 1.0 K/uL   Eosinophils Relative 3  0 - 5 %   Eosinophils Absolute 0.2  0.0  - 0.7 K/uL   Basophils Relative 0  0 - 1 %   Basophils Absolute 0.0  0.0 - 0.1 K/uL  COMPREHENSIVE METABOLIC PANEL      Result Value Ref Range   Sodium 140  137 - 147 mEq/L   Potassium 3.6 (*) 3.7 - 5.3 mEq/L   Chloride 104  96 - 112 mEq/L   CO2 26  19 - 32 mEq/L   Glucose, Bld 102 (*) 70 - 99 mg/dL   BUN 11  6 - 23 mg/dL   Creatinine, Ser 5.78  0.50 - 1.10 mg/dL   Calcium 8.9  8.4 - 46.9 mg/dL   Total Protein 7.2  6.0 - 8.3 g/dL   Albumin 3.6  3.5 - 5.2 g/dL   AST 16  0 - 37 U/L   ALT 13  0 - 35 U/L   Alkaline Phosphatase 49  39 - 117 U/L   Total Bilirubin 0.3  0.3 - 1.2 mg/dL   GFR calc non Af Amer >90  >90 mL/min   GFR calc Af Amer >90  >90 mL/min   Anion gap 10  5 - 15  TROPONIN I      Result Value Ref Range   Troponin I <0.30  <0.30 ng/mL  APTT      Result Value Ref Range   aPTT 30  24 - 37 seconds  PROTIME-INR      Result Value Ref Range   Prothrombin Time 13.3  11.6 - 15.2 seconds   INR 1.01  0.00 - 1.49  CBG MONITORING, ED      Result Value Ref Range   Glucose-Capillary 123 (*) 70 - 99 mg/dL    Laboratory interpretation all normal except mild hypokalemia    Imaging Review Ct Head Wo Contrast  11/20/2013   CLINICAL DATA:  Left-sided facial numbness  EXAM: CT HEAD WITHOUT CONTRAST  TECHNIQUE: Contiguous  axial images were obtained from the base of the skull through the vertex without intravenous contrast.  COMPARISON:  None.  FINDINGS: No acute intracranial hemorrhage. No focal mass lesion. No CT evidence of acute infarction. No midline shift or mass effect. No hydrocephalus. Dystrophic calcifications in the right basal ganglia. Basilar cisterns are patent. Paranasal sinuses and mastoid air cells are clear.  IMPRESSION: Normal head CT.   Electronically Signed   By: Genevive Bi M.D.   On: 11/20/2013 16:26     EKG Interpretation None       Date: 11/20/2013  Rate: 68  Rhythm: normal sinus rhythm  QRS Axis: normal  Intervals: normal  ST/T Wave  abnormalities: normal  Conduction Disutrbances:none  Narrative Interpretation:   Old EKG Reviewed: none available     MDM   Final diagnoses:  Left facial numbness  Syncope, unspecified syncope type   Plan discharge  Devoria Albe, MD, FACEP    I personally performed the services described in this documentation, which was scribed in my presence. The recorded information has been reviewed and considered. Devoria Albe, MD, FACEP     Ward Givens, MD 11/20/13 [phone]

## 2013-11-20 NOTE — ED Notes (Signed)
EMS reports pt was at work and had syncopal episode.  Reports has been having numbness on left side of face and left arm x 1 week.  Says was going to pcp Monday about the numbness because has history of TIAs.  Pt presently says feels better but c/o generalized weakness.

## 2013-11-22 ENCOUNTER — Ambulatory Visit: Payer: No Typology Code available for payment source | Attending: Internal Medicine

## 2013-11-25 ENCOUNTER — Ambulatory Visit: Payer: No Typology Code available for payment source | Attending: Internal Medicine | Admitting: Internal Medicine

## 2013-11-25 ENCOUNTER — Encounter: Payer: Self-pay | Admitting: Internal Medicine

## 2013-11-25 VITALS — BP 112/73 | HR 75 | Temp 98.0°F | Resp 16 | Ht <= 58 in | Wt 138.0 lb

## 2013-11-25 DIAGNOSIS — R5383 Other fatigue: Secondary | ICD-10-CM

## 2013-11-25 DIAGNOSIS — R531 Weakness: Secondary | ICD-10-CM

## 2013-11-25 DIAGNOSIS — E7800 Pure hypercholesterolemia, unspecified: Secondary | ICD-10-CM | POA: Insufficient documentation

## 2013-11-25 DIAGNOSIS — R5381 Other malaise: Secondary | ICD-10-CM | POA: Insufficient documentation

## 2013-11-25 DIAGNOSIS — K5900 Constipation, unspecified: Secondary | ICD-10-CM | POA: Insufficient documentation

## 2013-11-25 DIAGNOSIS — R52 Pain, unspecified: Secondary | ICD-10-CM | POA: Insufficient documentation

## 2013-11-25 MED ORDER — NAPROXEN 500 MG PO TABS: 500.0000 mg | ORAL_TABLET | Freq: Two times a day (BID) | ORAL | Status: DC

## 2013-11-25 MED ORDER — DIAZEPAM 5 MG PO TABS: 5.0000 mg | ORAL_TABLET | Freq: Two times a day (BID) | ORAL | Status: DC | PRN

## 2013-11-25 NOTE — Progress Notes (Signed)
Pt here per ER f/u visit for left facial numbness with near Syncope. Pt was worked up with negative CT head/EKG results Pt c/o feeling fatigue with headache,dizziness and facial weakness intermit x 1 year States she has been seen for same sx's but results negative Spanish interpretor present

## 2013-11-25 NOTE — Progress Notes (Signed)
Patient ID: Marissa Garcia, female   DOB: Sep 19, 1965, 48 y.o.   MRN: [id number]  CC: weakness and pain  HPI: Patient presents today for multiple complaints.  Patient has been evaluated here and in the ER several times for the same complaints of weakness and generalized pain.  Patient reports that since she received a "injection" in her lower back at a physicians office back in October she has had problems with her body.  She states that she has been weak, fatigued, dry skin, frequent headaches, difficulty sleeping, back pain, and near syncopal episodes.  Patient reports that she was told by her family on Saturday that she had a moment of left sided facial dropping and slurred speech.  She is unable to remember this event.  Patient denies any pain, swelling, or redness of her joints.  She denies fevers, chills, or rash.    No Known Allergies Past Medical History  Diagnosis Date  . Constipation   . Hypercholesteremia    Current Outpatient Prescriptions on File Prior to Visit  Medication Sig Dispense Refill  . acetaminophen (TYLENOL) 500 MG tablet Take 500 mg by mouth every 6 (six) hours as needed for pain.      Marland Kitchen ibuprofen (ADVIL,MOTRIN) 100 MG tablet Take 500 mg by mouth every 6 (six) hours as needed for fever.       No current facility-administered medications on file prior to visit.   Family History  Problem Relation Age of Onset  . Hypertension Father    History   Social History  . Marital Status: Single    Spouse Name: N/A    Number of Children: N/A  . Years of Education: N/A   Occupational History  . Not on file.   Social History Main Topics  . Smoking status: Never Smoker   . Smokeless tobacco: Not on file  . Alcohol Use: No  . Drug Use: No  . Sexual Activity: Not on file   Other Topics Concern  . Not on file   Social History Narrative  . No narrative on file   Review of Systems  Constitutional: Positive for malaise/fatigue. Negative for fever, chills and weight loss.   Eyes: Positive for blurred vision.  Respiratory: Negative.   Cardiovascular: Negative.   Gastrointestinal: Negative.   Genitourinary: Negative.   Musculoskeletal: Positive for back pain, myalgias and neck pain. Negative for falls and joint pain.  Skin: Negative for rash.  Neurological: Positive for dizziness, weakness and headaches. Negative for tingling, sensory change, speech change, seizures and loss of consciousness.     Objective:  There were no vitals filed for this visit.  Physical Exam: Constitutional: Patient appears well-developed and well-nourished. No distress. HENT: Normocephalic, atraumatic, External right and left ear normal. Oropharynx is clear and moist.  Eyes: Conjunctivae and EOM are normal. PERRLA, no scleral icterus. Neck: Normal ROM. Neck supple. No JVD. No tracheal deviation. No thyromegaly. CVS: RRR, S1/S2 +, no murmurs, no gallops, no carotid bruit.  Pulmonary: Effort and breath sounds normal, no stridor, rhonchi, wheezes, rales.  Abdominal: Soft. BS +,  no distension, tenderness, rebound or guarding.  Musculoskeletal: Normal range of motion. No edema and no tenderness.  Lymphadenopathy: No lymphadenopathy noted, cervical, inguinal or axillary Neuro: Alert. Normal reflexes, muscle tone coordination. No cranial nerve deficit. Skin: Skin is warm and dry. No rash noted. Not diaphoretic. No erythema. No pallor. Psychiatric: Normal mood and affect. Behavior, judgment, thought content normal.  Lab Results  Component Value Date   WBC 6.1  11/20/2013   HGB 12.1 11/20/2013   HCT 34.9* 11/20/2013   MCV 90.4 11/20/2013   PLT 283 11/20/2013   Lab Results  Component Value Date   CREATININE 0.69 11/20/2013   BUN 11 11/20/2013   NA 140 11/20/2013   K 3.6* 11/20/2013   CL 104 11/20/2013   CO2 26 11/20/2013    Lab Results  Component Value Date   HGBA1C  Value: 5.6 (NOTE)                                                                       According to the ADA Clinical  Practice Recommendations for 2011, when HbA1c is used as a screening test:   >=6.5%   Diagnostic of Diabetes Mellitus           (if abnormal result  is confirmed)  5.7-6.4%   Increased risk of developing Diabetes Mellitus  References:Diagnosis and Classification of Diabetes Mellitus,Diabetes Care,2011,34(Suppl 1):S62-S69 and Standards of Medical Care in         Diabetes - 2011,Diabetes Care,2011,34  (Suppl 1):S11-S61. 02/16/2010   Lipid Panel     Component Value Date/Time   CHOL 209* 11/01/2013 1704   TRIG 161* 11/01/2013 1704   HDL 54 11/01/2013 1704   CHOLHDL 3.9 11/01/2013 1704   VLDL 32 11/01/2013 1704   LDLCALC 123* 11/01/2013 1704       Assessment and plan:   Huntley DecSara was seen today for follow-up and numbness.  Diagnoses and associated orders for this visit:  Generalized pain - Ambulatory referral to Neurology - C-reactive protein - Sedimentation rate - ANA - diazepam (VALIUM) 5 MG tablet; Take 1 tablet (5 mg total) by mouth every 12 (twelve) hours as needed for anxiety. - naproxen (NAPROSYN) 500 MG tablet; Take 1 tablet (500 mg total) by mouth 2 (two) times daily with a meal. - Rheumatoid factor Some of this pain may be related to anxiety.  Patient given diazepam for back pain, spasms, and anxiety.  Weakness generalized No clear etiology to explain symptoms  Return if symptoms worsen or fail to improve.  Due to language barrier, an interpreter was present during the history-taking and subsequent discussion (and for part of the physical exam) with this patient.   Holland CommonsKECK, Tejah Brekke, NP-C [facility] [phone] 11/25/2013, 5:30 PM

## 2013-11-26 LAB — SEDIMENTATION RATE: Sed Rate: 8 mm/hr (ref 0–22)

## 2013-11-26 LAB — ANA: Anti Nuclear Antibody(ANA): NEGATIVE

## 2013-11-26 LAB — RHEUMATOID FACTOR: Rhuematoid fact SerPl-aCnc: <10 IU/mL (ref <=14)

## 2013-11-26 LAB — C-REACTIVE PROTEIN

## 2013-12-01 ENCOUNTER — Telehealth: Payer: Self-pay | Admitting: *Deleted

## 2013-12-01 NOTE — Telephone Encounter (Signed)
Message copied by Fredderick SeveranceUCATTE, Candra Wegner L on Wed Dec 01, 2013 11:38 AM ------      Message from: Holland CommonsKECK, VALERIE A      Created: Mon Nov 29, 2013  6:42 PM       Let patient know all autoimmune test are negative ------

## 2013-12-01 NOTE — Telephone Encounter (Signed)
Message left on VM for patient to return call to discuss lab results.

## 2014-01-10 ENCOUNTER — Ambulatory Visit: Payer: No Typology Code available for payment source | Admitting: Neurology

## 2014-01-12 ENCOUNTER — Telehealth: Payer: Self-pay | Admitting: Neurology

## 2014-01-12 NOTE — Telephone Encounter (Signed)
Pt no showed 01/10/14 NP appt w/ Dr. Karel Jarvis. Referring office notified via EPIC referral.   Marlane Hatcher - -please send pt a no show letter / Roanna Raider S/

## 2014-01-13 ENCOUNTER — Encounter: Payer: Self-pay | Admitting: *Deleted

## 2014-01-13 NOTE — Progress Notes (Signed)
No show letter sent for 01/10/2014 

## 2014-01-26 ENCOUNTER — Telehealth: Payer: Self-pay | Admitting: Neurology

## 2014-01-26 NOTE — Telephone Encounter (Signed)
Pt no showed new patient appt in August. She called today to r/s. Please advise, are we OK to r/s this no showed NP appt / Sherri S.

## 2014-01-26 NOTE — Telephone Encounter (Signed)
Ok to r/s, thanks 

## 2014-03-01 ENCOUNTER — Ambulatory Visit (INDEPENDENT_AMBULATORY_CARE_PROVIDER_SITE_OTHER): Payer: Self-pay | Admitting: Neurology

## 2014-03-01 ENCOUNTER — Encounter: Payer: Self-pay | Admitting: Neurology

## 2014-03-01 VITALS — BP 98/58 | HR 80 | Ht <= 58 in | Wt 138.0 lb

## 2014-03-01 DIAGNOSIS — R51 Headache: Secondary | ICD-10-CM

## 2014-03-01 DIAGNOSIS — R519 Headache, unspecified: Secondary | ICD-10-CM

## 2014-03-01 DIAGNOSIS — R200 Anesthesia of skin: Secondary | ICD-10-CM

## 2014-03-01 DIAGNOSIS — R413 Other amnesia: Secondary | ICD-10-CM

## 2014-03-01 MED ORDER — AMITRIPTYLINE HCL 10 MG PO TABS: ORAL_TABLET | ORAL | Status: DC

## 2014-03-01 NOTE — Progress Notes (Signed)
NEUROLOGY CONSULTATION NOTE  Marissa GreenhouseSara Garcia MRN: [id number] DOB: [birth year]  Referring provider: Holland CommonsValerie Keck, NP Primary care provider: Holland CommonsValerie Keck, NP  Reason for consult:  Back/neck pain, headaches, near syncopal episode  Thank you for your kind referral of Marissa GreenhouseSara Garcia for consultation of the above symptoms. Although her history is well known to you, please allow me to reiterate it for the purpose of our medical record. Records and images were personally reviewed where available.  HISTORY OF PRESENT ILLNESS: This is a 48 year old right-handed woman presenting for worsening headaches. On review of records, she had an episode of loss of consciousness last 11/20/13 with left-sided numbness.  She reports that all her symptoms started after she had injections in her back a year ago, "it damaged my back." She had passed out during the procedure. Since then she has been dealing with worsened back pain affecting her sleep. She would go for several days without sleep, and started having headaches which had increased recently. Headaches are mostly in the occipital region radiating down her neck or to the frontal and retro-orbital regions. She is sensitive to loud sounds and light, with occasional nausea. Headache frequency varies, they can last for 4-5 days, or she can go 2 to 7 days with no pain.  After several days without sleep, she noticed a change in behavior, she is always angry.  She has had difficulties concentrating in part-time cosmetology school. She wakes up in the middle of the night and cannot go back to sleep. Her eyes feels tired, like there is sand in them.  She had been prescribed diazepam, which helped the first week, since then she needs to take a second dose. She usually gets 3 hours of sleep. On 11/20/13, she had no sleep the night prior and recalls being very stressed. She started to feel like she was floating, then she was told the left side of her face went down. Her left arm and leg  felt numb and tingling. She went to the hospital where she had the same feeling of ligthheadedness, with palpitations. No focal numbness/tingling/weakness.  She has occasional dizziness, no diplopia, dysarthria, myoclonic jerks. She reports being very suppressed with buidling their new ohme. She occasionally has a floating feeling.  She has noticed memory change, which is unusual for her.  She forgot several things while in the hospital, and again when she got home, saying or doing something she does not recall. There is no prior history of headaches, her sister had headaches. She endorses significant stress at home supporting her family.  I personally reviewed head CT without contrast which was normal.  Laboratory Data: Lab Results  Component Value Date   WBC 6.1 11/20/2013   HGB 12.1 11/20/2013   HCT 34.9* 11/20/2013   MCV 90.4 11/20/2013   PLT 283 11/20/2013     Chemistry      Component Value Date/Time   NA 140 11/20/2013 1555   K 3.6* 11/20/2013 1555   CL 104 11/20/2013 1555   CO2 26 11/20/2013 1555   BUN 11 11/20/2013 1555   CREATININE 0.69 11/20/2013 1555   CREATININE 0.61 11/01/2013 1704      Component Value Date/Time   CALCIUM 8.9 11/20/2013 1555   ALKPHOS 49 11/20/2013 1555   AST 16 11/20/2013 1555   ALT 13 11/20/2013 1555   BILITOT 0.3 11/20/2013 1555        PAST MEDICAL HISTORY: Past Medical History  Diagnosis Date  . Constipation   .  Hypercholesteremia   . Headache     PAST SURGICAL HISTORY: Past Surgical History  Procedure Laterality Date  . Cesarean section      MEDICATIONS: Current Outpatient Prescriptions on File Prior to Visit  Medication Sig Dispense Refill  . acetaminophen (TYLENOL) 500 MG tablet Take 500 mg by mouth every 6 (six) hours as needed for pain.      . diazepam (VALIUM) 5 MG tablet Take 1 tablet (5 mg total) by mouth every 12 (twelve) hours as needed for anxiety.  60 tablet  0  . ibuprofen (ADVIL,MOTRIN) 100 MG tablet Take 500 mg by mouth every 6  (six) hours as needed for fever.       No current facility-administered medications on file prior to visit.    ALLERGIES: No Known Allergies  FAMILY HISTORY: Family History  Problem Relation Age of Onset  . Hypertension Father     SOCIAL HISTORY: History   Social History  . Marital Status: Single    Spouse Name: N/A    Number of Children: N/A  . Years of Education: N/A   Occupational History  . Not on file.   Social History Main Topics  . Smoking status: Never Smoker   . Smokeless tobacco: Not on file  . Alcohol Use: No  . Drug Use: No  . Sexual Activity: Not on file   Other Topics Concern  . Not on file   Social History Narrative  . No narrative on file    REVIEW OF SYSTEMS: Constitutional: No fevers, chills, or sweats, no generalized fatigue, change in appetite Eyes: No visual changes, double vision, eye pain Ear, nose and throat: No hearing loss, ear pain, nasal congestion, sore throat Cardiovascular: No chest pain, palpitations Respiratory:  No shortness of breath at rest or with exertion, wheezes GastrointestinaI: No nausea, vomiting, diarrhea, abdominal pain, fecal incontinence Genitourinary:  No dysuria, urinary retention or frequency Musculoskeletal:  + neck pain, back pain Integumentary: No rash, pruritus, skin lesions Neurological: as above Psychiatric: No depression, +insomnia, anxiety Endocrine: No palpitations, fatigue, diaphoresis, mood swings, change in appetite, change in weight, increased thirst Hematologic/Lymphatic:  No anemia, purpura, petechiae. Allergic/Immunologic: no itchy/runny eyes, nasal congestion, recent allergic reactions, rashes  PHYSICAL EXAM: Filed Vitals:   03/01/14 1349  BP: 98/58  Pulse: 80   General: No acute distress Head:  Normocephalic/atraumatic Eyes: Fundoscopic exam shows bilateral sharp discs, no vessel changes, exudates, or hemorrhages Neck: supple, no paraspinal tenderness, full range of motion Back: No  paraspinal tenderness Heart: regular rate and rhythm Lungs: Clear to auscultation bilaterally. Vascular: No carotid bruits. Skin/Extremities: No rash, no edema Neurological Exam: Mental status: alert and oriented to person, place, and time, no dysarthria or aphasia, Fund of knowledge is appropriate.  Recent and remote memory are intact.  Attention and concentration are normal.    Able to name objects and repeat phrases. Cranial nerves: CN I: not tested CN II: pupils equal, round and reactive to light, visual fields intact, fundi unremarkable. CN III, IV, VI:  full range of motion, no nystagmus, no ptosis CN V: facial sensation intact CN VII: upper and lower face symmetric CN VIII: hearing intact to finger rub CN IX, X: gag intact, uvula midline CN XI: sternocleidomastoid and trapezius muscles intact CN XII: tongue midline Bulk & Tone: normal, no fasciculations. Motor: 5/5 throughout with no pronator drift. Sensation: intact to light touch, cold, pin, vibration and joint position sense.  No extinction to double simultaneous stimulation.  Romberg test negative Deep  Tendon Reflexes: +2 throughout, no ankle clonus Plantar responses: downgoing bilaterally Cerebellar: no incoordination on finger to nose, heel to shin. No dysdiadochokinesia Gait: narrow-based and steady, able to tandem walk adequately. Tremor: none  IMPRESSION: This is a 48 year old right-handed woman presenting with worsening headaches in the setting of insomnia. She had an episode of loss of consciousness where she was witnessed to have left-sided symptoms and left facial droop that has resolved. Head CT normal. She feels back to herself. Routine EEG will be ordered.  Her main concern today are the headaches, insomnia, and mood changes. We discussed starting a daily headache prophylactic medication, she will start nortriptyline with uptitration as scheduled. Side effects were discussed. She will follow-up in 4 months  Thank  you for allowing me to participate in the care of this patient. Please do not hesitate to call for any questions or concerns.   Patrcia Dolly, M.D.  CC: [place] Commons

## 2014-03-01 NOTE — Patient Instructions (Addendum)
1. Start amitriptyline 10mg : Take 1 tablet at bedtime for 1 week, then increase to 2 tablets at bedtime 2. Start daily aspirin 81mg  once a day 3. Schedule routine EEG 4. Follow-up in 4 weeks

## 2014-03-03 ENCOUNTER — Encounter: Payer: Self-pay | Admitting: Neurology

## 2014-03-08 ENCOUNTER — Ambulatory Visit (INDEPENDENT_AMBULATORY_CARE_PROVIDER_SITE_OTHER): Payer: Self-pay | Admitting: Neurology

## 2014-03-08 DIAGNOSIS — R413 Other amnesia: Secondary | ICD-10-CM

## 2014-03-08 DIAGNOSIS — R200 Anesthesia of skin: Secondary | ICD-10-CM

## 2014-03-11 NOTE — Procedures (Signed)
ELECTROENCEPHALOGRAM REPORT  Date of Study: 03/08/2014  Patient's Name: Marissa GreenhouseSara Abelson MRN: [id number] Date of Birth: 12-10-1965  Referring Provider: Dr. Patrcia DollyKaren Tiquan Bouch  Clinical History: This is a 48 year old woman presenting with worsening headaches and an episode of loss of consciousness where she was witnessed to have left-sided symptoms and left facial droop that has resolved  Medications: Valium, Tylenol, Motrin  Technical Summary: A multichannel digital EEG recording measured by the international 10-20 system with electrodes applied with paste and impedances below 5000 ohms performed in our laboratory with EKG monitoring in an awake and asleep patient.  Hyperventilation and photic stimulation were performed.  The digital EEG was referentially recorded, reformatted, and digitally filtered in a variety of bipolar and referential montages for optimal display.    Description: The patient is awake and asleep during the recording.  During maximal wakefulness, there is a symmetric, medium voltage 11 Hz posterior dominant rhythm that attenuates with eye opening.  The record is symmetric.  During drowsiness and sleep, there is an increase in theta slowing of the background.  Vertex waves and symmetric sleep spindles were seen.  Hyperventilation and photic stimulation did not elicit any abnormalities.  There were no epileptiform discharges or electrographic seizures seen.    EKG lead was unremarkable.  Impression: This awake and asleep EEG is normal.    Clinical Correlation: A normal EEG does not exclude a clinical diagnosis of epilepsy.  If further clinical questions remain, prolonged EEG may be helpful.  Clinical correlation is advised.   Patrcia DollyKaren Ermalee Mealy, M.D.

## 2014-03-23 ENCOUNTER — Ambulatory Visit: Payer: No Typology Code available for payment source | Attending: Internal Medicine

## 2014-04-01 ENCOUNTER — Ambulatory Visit: Payer: No Typology Code available for payment source | Admitting: Neurology

## 2014-04-04 ENCOUNTER — Telehealth: Payer: Self-pay | Admitting: Neurology

## 2014-04-04 NOTE — Telephone Encounter (Signed)
Pt no showed 04/01/14 appt w/ Dr. Aquino. ° °Erica - please send no show letter / Sherri S.  °

## 2014-04-12 ENCOUNTER — Encounter: Payer: Self-pay | Admitting: *Deleted

## 2014-04-12 NOTE — Progress Notes (Signed)
No show letter sent 04/01/2014

## 2014-04-28 ENCOUNTER — Ambulatory Visit: Payer: No Typology Code available for payment source | Admitting: Internal Medicine

## 2014-05-02 ENCOUNTER — Ambulatory Visit: Payer: No Typology Code available for payment source | Admitting: Internal Medicine

## 2014-07-04 ENCOUNTER — Emergency Department (INDEPENDENT_AMBULATORY_CARE_PROVIDER_SITE_OTHER)
Admission: EM | Admit: 2014-07-04 | Discharge: 2014-07-04 | Disposition: A | Discharge status: Home / Self Care | Payer: Self-pay | Source: Home / Self Care | Attending: Emergency Medicine | Admitting: Emergency Medicine

## 2014-07-04 ENCOUNTER — Encounter (HOSPITAL_COMMUNITY): Payer: Self-pay | Admitting: Emergency Medicine

## 2014-07-04 DIAGNOSIS — F439 Reaction to severe stress, unspecified: Secondary | ICD-10-CM

## 2014-07-04 DIAGNOSIS — Z658 Other specified problems related to psychosocial circumstances: Secondary | ICD-10-CM

## 2014-07-04 DIAGNOSIS — H1132 Conjunctival hemorrhage, left eye: Secondary | ICD-10-CM

## 2014-07-04 DIAGNOSIS — R51 Headache: Secondary | ICD-10-CM

## 2014-07-04 DIAGNOSIS — R519 Headache, unspecified: Secondary | ICD-10-CM

## 2014-07-04 MED ORDER — KETOROLAC TROMETHAMINE 60 MG/2ML IM SOLN
INTRAMUSCULAR | Status: AC
  Filled 2014-07-04: qty 2

## 2014-07-04 MED ORDER — KETOROLAC TROMETHAMINE 60 MG/2ML IM SOLN
60.0000 mg | Freq: Once | INTRAMUSCULAR | Status: AC
  Administered 2014-07-04: 60 mg via INTRAMUSCULAR

## 2014-07-04 MED ORDER — AMITRIPTYLINE HCL 10 MG PO TABS: ORAL_TABLET | ORAL | Status: DC

## 2014-07-04 NOTE — ED Notes (Signed)
C/o intermittent left side HA onset 2 weeks; has been getting worse the past 2 days Also reports left eye twitch and subconjunctival hemorrhage that began today Pt started crying in the room and states she has a lot of family stress at home w/her son and her husband Denies suicidal thoughts or desire to harm anyone else.  She is alert, no signs of acute distress.

## 2014-07-04 NOTE — Discharge Instructions (Signed)
Your headache is likely from all of your stress. We gave you some medicine today that will help the headache. When you get home tonight, please take a Benadryl to help you sleep. When you wake up, your headache should be much better.  I have given you a prescription for amitriptyline. Take 1 pill at bedtime for 1 week, then increase to 2 tablets at bedtime. This will help with your anxiety and stress.  Please follow-up with your clinic or here in 2 weeks.

## 2014-07-04 NOTE — ED Provider Notes (Signed)
CSN: [id number]     Arrival date & time 07/04/14  1708 History   First MD Initiated Contact with Patient 07/04/14 1952     Chief Complaint  Patient presents with  . Headache   (Consider location/radiation/quality/duration/timing/severity/associated sxs/prior Treatment) HPI She is a 49 year old woman here for evaluation of headache.  For the last 2 weeks she has had an intermittent posterior headache. This morning, she developed a headache on the left side of her head. Was described as "hot." It was associated with some eye tiredness and photophobia. She also reports left eye twitching with it. No nausea or vomiting. No focal numbness, tingling, weakness. She does not have a personal history of migraine headache, but she does have a family history.  She also reports significant stress at home. She had a family member die, her husband left, and her son is on drugs. She describes feeling hopeless. She denies any suicidal or homicidal ideation. She states in the past she was on a medicine to help with her anxiety that was beneficial.  Past Medical History  Diagnosis Date  . Constipation   . Hypercholesteremia   . Headache    Past Surgical History  Procedure Laterality Date  . Cesarean section     Family History  Problem Relation Age of Onset  . Hypertension Father    History  Substance Use Topics  . Smoking status: Never Smoker   . Smokeless tobacco: Not on file  . Alcohol Use: No   OB History    No data available     Review of Systems  Constitutional: Negative for fever.  Respiratory: Negative.   Cardiovascular: Negative.   Gastrointestinal: Negative.   Genitourinary: Negative.   Musculoskeletal: Negative.   Skin: Negative.   Neurological: Positive for headaches. Negative for weakness and numbness.  Psychiatric/Behavioral: Positive for sleep disturbance. Negative for suicidal ideas, hallucinations and self-injury. The patient is nervous/anxious.     Allergies  Review of  patient's allergies indicates no known allergies.  Home Medications   Prior to Admission medications   Medication Sig Start Date End Date Taking? Authorizing Provider  acetaminophen (TYLENOL) 500 MG tablet Take 500 mg by mouth every 6 (six) hours as needed for pain.    Historical Provider, MD  amitriptyline (ELAVIL) 10 MG tablet Take 1 tablet at bedtime for 1 week, then increase to 2 tablets at bedtime 07/04/14   Charm RingsErin J Honig, MD  diazepam (VALIUM) 5 MG tablet Take 1 tablet (5 mg total) by mouth every 12 (twelve) hours as needed for anxiety. 11/25/13   Ambrose FinlandValerie A Keck, NP  ibuprofen (ADVIL,MOTRIN) 100 MG tablet Take 500 mg by mouth every 6 (six) hours as needed for fever.    Historical Provider, MD   BP 128/78 mmHg  Pulse 63  Temp(Src) 97.9 F (36.6 C) (Oral)  Resp 18  SpO2 100% Physical Exam  Constitutional: She is oriented to person, place, and time. She appears well-developed and well-nourished. She appears distressed (tearful during interview).  HENT:  Mouth/Throat: Oropharynx is clear and moist.  Eyes: EOM are normal. Pupils are equal, round, and reactive to light.    Neck: Neck supple.  Cardiovascular: Normal rate, regular rhythm and normal heart sounds.   No murmur heard. Pulmonary/Chest: Effort normal and breath sounds normal. No respiratory distress. She has no wheezes. She has no rales.  Neurological: She is alert and oriented to person, place, and time. No cranial nerve deficit. She exhibits normal muscle tone.  Psychiatric:  She is  tearful during the exam. Mood described as hopeless, affect is consistent. No suicidal or homicidal ideation. She has linear thought. Good judgment.    ED Course  Procedures (including critical care time) Labs Review Labs Reviewed - No data to display  Imaging Review No results found.   MDM   1. Acute nonintractable headache, unspecified headache type   2. Stress   3. New onset headache   4. Subconjunctival hemorrhage of left eye     Toradol 60 mg IM given.  Will treat headache with Toradol here and Benadryl when she gets home. Sub-conjunctival hemorrhage will resolve on its own. Will restart amitriptyline as this benefited her anxiety in the past. She will follow-up with her PCP in the next 2 weeks.    Charm Rings, MD 07/04/14 2034

## 2014-10-12 ENCOUNTER — Encounter: Payer: Self-pay | Admitting: Internal Medicine

## 2014-10-12 ENCOUNTER — Ambulatory Visit: Payer: Self-pay | Attending: Internal Medicine | Admitting: Internal Medicine

## 2014-10-12 VITALS — BP 109/59 | HR 62 | Temp 98.1°F | Resp 18 | Ht 59.0 in | Wt 143.0 lb

## 2014-10-12 DIAGNOSIS — M549 Dorsalgia, unspecified: Secondary | ICD-10-CM | POA: Insufficient documentation

## 2014-10-12 DIAGNOSIS — M546 Pain in thoracic spine: Secondary | ICD-10-CM

## 2014-10-12 DIAGNOSIS — R51 Headache: Secondary | ICD-10-CM | POA: Insufficient documentation

## 2014-10-12 DIAGNOSIS — F419 Anxiety disorder, unspecified: Secondary | ICD-10-CM | POA: Insufficient documentation

## 2014-10-12 DIAGNOSIS — Z0000 Encounter for general adult medical examination without abnormal findings: Secondary | ICD-10-CM | POA: Insufficient documentation

## 2014-10-12 LAB — POCT URINALYSIS DIPSTICK
Bilirubin, UA: NEGATIVE
Glucose, UA: NEGATIVE
Ketones, UA: NEGATIVE
Leukocytes, UA: NEGATIVE
Nitrite, UA: NEGATIVE
PROTEIN UA: NEGATIVE
Spec Grav, UA: 1.015
UROBILINOGEN UA: 0.2
pH, UA: 7.5

## 2014-10-12 MED ORDER — ALPRAZOLAM 0.25 MG PO TABS: 0.2500 mg | ORAL_TABLET | Freq: Two times a day (BID) | ORAL | Status: DC | PRN

## 2014-10-12 MED ORDER — IBUPROFEN 600 MG PO TABS: 600.0000 mg | ORAL_TABLET | Freq: Three times a day (TID) | ORAL | Status: DC | PRN

## 2014-10-12 NOTE — Patient Instructions (Addendum)
Please call Stoney BangSabrina Holland, [phone],  with the BCCCP (breast and cervical cancer control program) at the [facility] to set up an appointment to verify eligibility for a breast exam, mammogram, ultrasound. If you qualify this [facility].  Trastorno de ansiedad generalizada (Generalized Anxiety Disorder) El trastorno de ansiedad generalizada es un trastorno mental. Interfiere en las funciones vitales, incluyendo las Geneseorelaciones, el trabajo y la escuela.  Es diferente de la ansiedad normal que todas las personas experimentan en algn momento de su vida en respuesta a sucesos y Chief Operating Officeractividades especficas. En verdad, la ansiedad normal nos ayuda a prepararnos y Human resources officeratravesar estos acontecimientos y actividades de la vida. La ansiedad normal desaparece despus de que el evento o la actividad ha finalizado.  El trastorno de ansiedad generalizada no est necesariamente relacionada con eventos o actividades especficas. Tambin causa un exceso de ansiedad en proporcin a sucesos o actividades especficas. En este trastorno la ansiedad es difcil de Chief Operating Officercontrolar. Los sntomas pueden variar de leves a muy graves. Las personas que sufren de trastorno de ansiedad generalizada pueden tener intensas olas de ansiedad con sntomas fsicos (ataques de pnico).  SNTOMAS  La ansiedad y la preocupacin asociada a este trastorno son difciles de Chief Operating Officercontrolar. Esta ansiedad y la preocupacin estn relacionados con muchos eventos de la vida y sus actividades y tambin ocurre durante ms [employer] de los que no ocurre, durante 6 meses o ms. Las personas que la sufren pueden tener tres o ms de los siguientes sntomas (uno o ms en los nios):   Glass blower/designerAgitacin   Fatiga.  Dificultades de concentracin.   Irritabilidad.  Tensin muscular  Dificultad para dormirse o sueo poco satisfactorio. DIAGNSTICO  Se diagnostica a travs de una evaluacin realizada por el mdico. El mdico le har preguntas acerca de su  estado de nimo, sntomas fsicos y sucesos de Oregonsu vida. Le har preguntas sobre su historia clnica, el consumo de alcohol o drogas, incluyendo los medicamentos recetados. Nucor Corporationambin le har un examen fsico e indicar anlisis de Romneysangre. Ciertas enfermedades y el uso de determinadas sustancias pueden causar sntomas similares a este trastorno. Su mdico lo puede derivar a Music therapistun especialista en salud mental para una evaluacin ms profunda.Gerlean Ren.  TRATAMIENTO  Las terapias siguientes se utilizan en el tratamiento de este trastorno:   Medicamentos - Se recetan antidepresivos para el control diario a Air cabin crewlargo plazo. Pueden indicarse tambin medicamentos para combatir la Cox Communicationsansiedad en los casos graves, especialmente cuando ocurren ataques de pnico.   Terapia conversada (psicoterapia) Ciertos tipos de psicoterapia pueden ser tiles en el tratamiento del trastorno de ansiedad generalizada, proporcionando apoyo, educacin y Optometristorientacin. Una forma de psicoterapia llamada terapia cognitivo-conductual puede ensearle formas saludables de pensar y Publishing rights managerreaccionar a los eventos y actividades de la vida diaria.  Tcnicasde manejo del estrs- Estas tcnicas incluyen el yoga, la meditacin y el ejercicio y pueden ser muy tiles cuando se practican con regularidad. Un especialista en salud mental puede ayudar a determinar qu tratamiento es mejor para usted. Algunas personas obtienen mejora con una terapia. Sin embargo, Economistotras personas requieren una combinacin de terapias.  Document Released: 08/24/2012 Document Revised: 09/13/2013 [facility]ExitCare Patient Information 2015 [employer], [place]. This information is not intended to replace advice given to you by your health care provider. Make sure you discuss any questions you have with your health care provider.

## 2014-10-12 NOTE — Progress Notes (Signed)
Patient ID: Marissa Garcia, female   DOB: Dec 04, 1965, 49 y.o.   MRN: [id number]  CC: physical, back pain, headache  HPI: Marissa Garcia is a 49 y.o. female here today for a annual physical and pap smear.  Patient has past medical history of HLD and headaches. She states that she is due for a mammogram. She reports mild vaginal odor with very minimal white discharge. She would like STD testing today.  Patient reports pain in mid back pain for the past several months, described as constant pressure, at level 5. She works as a Conservation officer, historic buildings. Pain aggravated by doing laundry or mopping.   Patient reports being under a lot of stress over the past month. Has headache, patient thinks is related to stress. Husband left wife in January and she has been having to raise her kids alone. She states that she feels very anxious whenever he calls or comes around. Patient takes melatonin at night to help with sleep, as needed.   No Known Allergies Past Medical History  Diagnosis Date  . Constipation   . Hypercholesteremia   . Headache    Current Outpatient Prescriptions on File Prior to Visit  Medication Sig Dispense Refill  . ibuprofen (ADVIL,MOTRIN) 100 MG tablet Take 500 mg by mouth every 6 (six) hours as needed for fever.    Marland Kitchen acetaminophen (TYLENOL) 500 MG tablet Take 500 mg by mouth every 6 (six) hours as needed for pain.    Marland Kitchen amitriptyline (ELAVIL) 10 MG tablet Take 1 tablet at bedtime for 1 week, then increase to 2 tablets at bedtime (Patient not taking: Reported on 10/12/2014) 60 tablet 1  . diazepam (VALIUM) 5 MG tablet Take 1 tablet (5 mg total) by mouth every 12 (twelve) hours as needed for anxiety. (Patient not taking: Reported on 10/12/2014) 60 tablet 0   No current facility-administered medications on file prior to visit.   Family History  Problem Relation Age of Onset  . Hypertension Father    History   Social History  . Marital Status: Single    Spouse Name: N/A  . Number of Children: N/A  .  Years of Education: N/A   Occupational History  . Not on file.   Social History Main Topics  . Smoking status: Never Smoker   . Smokeless tobacco: Not on file  . Alcohol Use: No  . Drug Use: No  . Sexual Activity: Not on file   Other Topics Concern  . Not on file   Social History Narrative    Review of Systems  Cardiovascular: Positive for palpitations.  Gastrointestinal: Positive for nausea and abdominal pain.  Neurological: Positive for tingling and headaches.  Psychiatric/Behavioral: Negative for depression, suicidal ideas and substance abuse. The patient is nervous/anxious.   All other systems reviewed and are negative.   Objective:   Filed Vitals:   10/12/14 1600  BP: 109/59  Pulse: 62  Temp: 98.1 F (36.7 C)  Resp: 18    Physical Exam: Constitutional: Patient appears well-developed and well-nourished. No distress. HENT: Normocephalic, atraumatic, External right and left ear normal. Oropharynx is clear and moist.  Eyes: Conjunctivae and EOM are normal. PERRLA, no scleral icterus. Neck: Normal ROM. Neck supple. No JVD. No tracheal deviation. No thyromegaly. CVS: RRR, S1/S2 +, no murmurs, no gallops, no carotid bruit.  Pulmonary: Effort and breath sounds normal, no stridor, rhonchi, wheezes, rales.  Abdominal: Soft. BS +,  no distension, tenderness, rebound or guarding.  Musculoskeletal: Normal range of motion. No edema and  no tenderness.  Lymphadenopathy: No lymphadenopathy noted, cervical, inguinal or axillary Neuro: Alert. Normal reflexes, muscle tone coordination. No cranial nerve deficit. Skin: Skin is warm and dry. No rash noted. Not diaphoretic. No erythema. No pallor. Psychiatric: Normal mood and affect. Behavior, judgment, thought content normal. Genitalia: Normal female without lesion, discharge or tenderness, NSSA, NT, no adnexal masses felt on exam  Breast: No tenderness, masses, or nipple abnormality     Lab Results  Component Value Date   WBC  6.1 11/20/2013   HGB 12.1 11/20/2013   HCT 34.9* 11/20/2013   MCV 90.4 11/20/2013   PLT 283 11/20/2013   Lab Results  Component Value Date   CREATININE 0.69 11/20/2013   BUN 11 11/20/2013   NA 140 11/20/2013   K 3.6* 11/20/2013   CL 104 11/20/2013   CO2 26 11/20/2013    Lab Results  Component Value Date   HGBA1C  02/16/2010    5.6 (NOTE)                                                                       According to the ADA Clinical Practice Recommendations for 2011, when HbA1c is used as a screening test:   >=6.5%   Diagnostic of Diabetes Mellitus           (if abnormal result  is confirmed)  5.7-6.4%   Increased risk of developing Diabetes Mellitus  References:Diagnosis and Classification of Diabetes Mellitus,Diabetes Care,2011,34(Suppl 1):S62-S69 and Standards of Medical Care in         Diabetes - 2011,Diabetes Care,2011,34  (Suppl 1):S11-S61.   Lipid Panel     Component Value Date/Time   CHOL 209* 11/01/2013 1704   TRIG 161* 11/01/2013 1704   HDL 54 11/01/2013 1704   CHOLHDL 3.9 11/01/2013 1704   VLDL 32 11/01/2013 1704   LDLCALC 123* 11/01/2013 1704       Assessment and plan:   Marissa Garcia was seen today for annual exam and gynecologic exam.  Diagnoses and all orders for this visit:  Annual physical exam Orders: -     Urinalysis Dipstick -     MM Digital Screening; Future -     Cytology - PAP -     Cervicovaginal ancillary only -     HIV antibody (with reflex)  Left-sided thoracic back pain Orders: -     ibuprofen (ADVIL,MOTRIN) 600 MG tablet; Take 1 tablet (600 mg total) by mouth every 8 (eight) hours as needed.  Anxiety Orders: -     Begin ALPRAZolam (XANAX) 0.25 MG tablet; Take 1 tablet (0.25 mg total) by mouth 2 (two) times daily as needed for anxiety. I have explained to patient to only take as needed. I have helped patient to develop a plan that will help her manage her stress and anxiety.  Return if symptoms worsen or fail to improve.       Holland CommonsKECK,  VALERIE, NP-C [facility] [phone] 10/12/2014, 4:24 PM

## 2014-10-12 NOTE — Progress Notes (Signed)
Patient here for annual physical and pap exam. Patient received letter requesting patient to have mammogram. Patient reports pain in mid back pain, described as constant pressure, at level 5. If patient does any activity, for example laundry or mopping. Patient coughs when the pain is worse.   Patient reports being under a lot of stress over the past month. Has headache, patient thinks is related to stress, over past month. Husband left wife in January. Patient trying to raise kids.  Patient takes melatonin at night to help with sleep, as needed.   Patient has warts on hands for a long time, but are getting worse.

## 2014-10-13 ENCOUNTER — Telehealth: Payer: Self-pay | Admitting: Clinical

## 2014-10-13 LAB — HIV ANTIBODY (ROUTINE TESTING W REFLEX): HIV 1&2 Ab, 4th Generation: NONREACTIVE

## 2014-10-13 NOTE — Telephone Encounter (Signed)
Completed call.  

## 2014-10-14 LAB — CYTOLOGY - PAP

## 2014-10-14 LAB — CERVICOVAGINAL ANCILLARY ONLY
Chlamydia: NEGATIVE
NEISSERIA GONORRHEA: NEGATIVE
WET PREP (BD AFFIRM): NEGATIVE

## 2014-10-20 ENCOUNTER — Telehealth: Payer: Self-pay | Admitting: *Deleted

## 2014-10-20 NOTE — Telephone Encounter (Signed)
-----   Message from Valerie A Keck, NP sent at 10/19/2014  6:35 PM EDT ----- Patient pap is negative for malignancies and infections. Will repeat in 3 years.   

## 2014-10-20 NOTE — Telephone Encounter (Signed)
Results given.

## 2014-10-27 ENCOUNTER — Emergency Department (HOSPITAL_COMMUNITY)
Admission: EM | Admit: 2014-10-27 | Discharge: 2014-10-27 | Disposition: A | Discharge status: Home / Self Care | Payer: No Typology Code available for payment source | Attending: Emergency Medicine | Admitting: Emergency Medicine

## 2014-10-27 ENCOUNTER — Emergency Department (HOSPITAL_COMMUNITY): Payer: No Typology Code available for payment source

## 2014-10-27 ENCOUNTER — Encounter (HOSPITAL_COMMUNITY): Payer: Self-pay

## 2014-10-27 DIAGNOSIS — Z8719 Personal history of other diseases of the digestive system: Secondary | ICD-10-CM | POA: Insufficient documentation

## 2014-10-27 DIAGNOSIS — Y998 Other external cause status: Secondary | ICD-10-CM | POA: Insufficient documentation

## 2014-10-27 DIAGNOSIS — Y92410 Unspecified street and highway as the place of occurrence of the external cause: Secondary | ICD-10-CM | POA: Diagnosis not present

## 2014-10-27 DIAGNOSIS — Z8639 Personal history of other endocrine, nutritional and metabolic disease: Secondary | ICD-10-CM | POA: Insufficient documentation

## 2014-10-27 DIAGNOSIS — S161XXA Strain of muscle, fascia and tendon at neck level, initial encounter: Secondary | ICD-10-CM | POA: Diagnosis not present

## 2014-10-27 DIAGNOSIS — S3992XA Unspecified injury of lower back, initial encounter: Secondary | ICD-10-CM | POA: Insufficient documentation

## 2014-10-27 DIAGNOSIS — S199XXA Unspecified injury of neck, initial encounter: Secondary | ICD-10-CM | POA: Diagnosis present

## 2014-10-27 DIAGNOSIS — Y9389 Activity, other specified: Secondary | ICD-10-CM | POA: Insufficient documentation

## 2014-10-27 DIAGNOSIS — S0990XA Unspecified injury of head, initial encounter: Secondary | ICD-10-CM | POA: Diagnosis not present

## 2014-10-27 DIAGNOSIS — S24109A Unspecified injury at unspecified level of thoracic spinal cord, initial encounter: Secondary | ICD-10-CM | POA: Diagnosis not present
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
## 2014-10-27 NOTE — ED Notes (Signed)
MD at bedside. 
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
## 2014-10-27 NOTE — ED Notes (Addendum)
Ambulated patient in hallway with no deficits noted. Patient tolerated well.

## 2014-11-07 ENCOUNTER — Ambulatory Visit: Payer: No Typology Code available for payment source | Attending: Internal Medicine | Admitting: Internal Medicine

## 2014-11-07 ENCOUNTER — Encounter: Payer: Self-pay | Admitting: Internal Medicine

## 2014-11-07 VITALS — BP 103/70 | HR 89 | Temp 97.8°F | Resp 16 | Wt 143.2 lb

## 2014-11-07 DIAGNOSIS — M5442 Lumbago with sciatica, left side: Secondary | ICD-10-CM

## 2014-11-07 MED ORDER — CYCLOBENZAPRINE HCL 10 MG PO TABS: 10.0000 mg | ORAL_TABLET | Freq: Two times a day (BID) | ORAL | Status: DC | PRN

## 2014-11-07 NOTE — Patient Instructions (Signed)
Get hospital discount and I will send you to orthopedics
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
## 2014-12-07 ENCOUNTER — Ambulatory Visit: Payer: Self-pay

## 2015-01-04 ENCOUNTER — Ambulatory Visit: Payer: No Typology Code available for payment source | Attending: Internal Medicine

## 2015-01-12 ENCOUNTER — Ambulatory Visit: Payer: Self-pay | Attending: Internal Medicine | Admitting: Internal Medicine

## 2015-01-12 ENCOUNTER — Encounter: Payer: Self-pay | Admitting: Internal Medicine

## 2015-01-12 VITALS — BP 104/65 | HR 66 | Temp 98.0°F | Resp 16 | Ht <= 58 in | Wt 143.0 lb

## 2015-01-12 DIAGNOSIS — M5442 Lumbago with sciatica, left side: Secondary | ICD-10-CM

## 2015-01-12 DIAGNOSIS — E780 Pure hypercholesterolemia: Secondary | ICD-10-CM | POA: Insufficient documentation

## 2015-01-12 DIAGNOSIS — M542 Cervicalgia: Secondary | ICD-10-CM

## 2015-01-12 DIAGNOSIS — Z79899 Other long term (current) drug therapy: Secondary | ICD-10-CM | POA: Insufficient documentation
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
## 2015-08-10 ENCOUNTER — Other Ambulatory Visit: Payer: Self-pay | Admitting: Internal Medicine

## 2015-08-10 DIAGNOSIS — Z1231 Encounter for screening mammogram for malignant neoplasm of breast: Secondary | ICD-10-CM

## 2015-08-21 ENCOUNTER — Ambulatory Visit: Payer: Self-pay | Admitting: Internal Medicine

## 2015-08-24 ENCOUNTER — Ambulatory Visit: Payer: Self-pay | Attending: Internal Medicine | Admitting: Physician Assistant

## 2015-08-24 VITALS — BP 108/71 | HR 70 | Temp 97.6°F | Resp 16 | Ht 59.0 in | Wt 135.8 lb

## 2015-08-24 DIAGNOSIS — N912 Amenorrhea, unspecified: Secondary | ICD-10-CM | POA: Insufficient documentation

## 2015-08-24 DIAGNOSIS — E7800 Pure hypercholesterolemia, unspecified: Secondary | ICD-10-CM | POA: Insufficient documentation

## 2015-08-24 DIAGNOSIS — G479 Sleep disorder, unspecified: Secondary | ICD-10-CM | POA: Insufficient documentation

## 2015-08-24 DIAGNOSIS — R05 Cough: Secondary | ICD-10-CM | POA: Insufficient documentation

## 2015-08-24 DIAGNOSIS — R5383 Other fatigue: Secondary | ICD-10-CM | POA: Insufficient documentation

## 2015-08-24 DIAGNOSIS — R232 Flushing: Secondary | ICD-10-CM

## 2015-08-24 DIAGNOSIS — J302 Other seasonal allergic rhinitis: Secondary | ICD-10-CM | POA: Insufficient documentation

## 2015-08-24 DIAGNOSIS — N951 Menopausal and female climacteric states: Secondary | ICD-10-CM | POA: Insufficient documentation

## 2015-08-24 LAB — COMPREHENSIVE METABOLIC PANEL
ALK PHOS: 50 U/L (ref 33–115)
ALT: 18 U/L (ref 6–29)
AST: 19 U/L (ref 10–35)
Albumin: 4.3 g/dL (ref 3.6–5.1)
BUN: 15 mg/dL (ref 7–25)
CO2: 26 mmol/L (ref 20–31)
Calcium: 9.6 mg/dL (ref 8.6–10.2)
Chloride: 102 mmol/L (ref 98–110)
Creat: 0.64 mg/dL (ref 0.50–1.10)
GLUCOSE: 92 mg/dL (ref 65–99)
POTASSIUM: 3.9 mmol/L (ref 3.5–5.3)
SODIUM: 140 mmol/L (ref 135–146)
Total Bilirubin: 0.4 mg/dL (ref 0.2–1.2)
Total Protein: 7.6 g/dL (ref 6.1–8.1)

## 2015-08-24 LAB — THYROID PANEL WITH TSH
Free Thyroxine Index: 1.9 (ref 1.4–3.8)
T3 UPTAKE: 31 % (ref 22–35)
T4, Total: 6 ug/dL (ref 4.5–12.0)
TSH: 1.16 m[IU]/L

## 2015-08-24 LAB — POCT URINE PREGNANCY: PREG TEST UR: NEGATIVE

## 2015-08-24 MED ORDER — ALUMINUM CHLORIDE 20 % EX SOLN: Freq: Every day | CUTANEOUS | Status: DC

## 2015-08-24 MED ORDER — SERTRALINE HCL 50 MG PO TABS: 50.0000 mg | ORAL_TABLET | Freq: Every day | ORAL | Status: DC

## 2015-08-24 MED FILL — DRYSOL DAB-O-MATIC SOLUTION: 20 | 35 days supply | Qty: 35 | Fill #0

## 2015-08-24 MED FILL — SERTRALINE HCL 50 MG TABLET: 50 | 30 days supply | Qty: 30 | Fill #0

## 2015-08-24 NOTE — Progress Notes (Signed)
Patient ID: Marissa Garcia, female   DOB: 1966/01/01, 50 y.o.   MRN: [id number]   Marissa Garcia, is a 50 y.o. female  [id number]  HCW:[id number]  DOB - 1966/01/01  Chief Complaint  Patient presents with  . Hot Flashes  . Follow-up        Subjective:  Chief Complaint and HPI: Marissa Garcia is a 50 y.o. female here today with complaints of hot flashes, sleep disturbance, vaginal dryness, general body aches and pains, and no menses since January.  Prior to January, menses regular and happen about every 4 weeks.  She is unsure of her mom's age at menopause.  She denies FH of breast CA.  Non-smoker. Does zumba and exercises a few times each week.  She has to take 1-3mg  of melatonin at night for sleep.  Ibuprofen helps with body aches.  Monogamous with husband.  No BC-+withdrawal method. She is also c/o sneezing, tichy, watery eyes X 3 days.  No f/c.  +mild cough.  Denies ST or other signs of illness.   ROS:   Constitutional:  No f/c, No unexplained weight loss.  +hot flashes EENT:  No vision changes, No blurry vision, No hearing changes. +"eyes are red," No mouth, throat, or ear problems.  Respiratory: +minimal cough, No SOB Cardiac: No CP, no palpitations GI:  No abd pain, No N/V/D. GU: No Urinary s/sx.  +vaginal dryness causing dyspareunia, no vaginal discharge, +occasional vaginal odor Musculoskeletal: No joint pain; sporadic/intermittent achy muscles Neuro: No headache, no dizziness, no motor weakness.  Skin: No rash Endocrine:  No polydipsia. No polyuria.  Psych: Denies SI/HI    ALLERGIES: No Known Allergies  PAST MEDICAL HISTORY: Past Medical History  Diagnosis Date  . Constipation   . Hypercholesteremia   . Headache     MEDICATIONS AT HOME: Prior to Admission medications   Medication Sig Start Date End Date Taking? Authorizing Provider  Melatonin 3 MG TABS Take 1.5 mg by mouth at bedtime as needed (for sleep).   Yes Historical Provider, MD  aluminum chloride (DRYSOL) 20  % external solution Apply topically at bedtime. 08/24/15   Anders SimmondsAngela M McClung, PA-C  cyclobenzaprine (FLEXERIL) 10 MG tablet Take 1 tablet (10 mg total) by mouth 2 (two) times daily as needed for muscle spasms. Patient not taking: Reported on 08/24/2015 11/07/14   Ambrose FinlandValerie A Keck, NP  ibuprofen (ADVIL,MOTRIN) 600 MG tablet Take 1 tablet (600 mg total) by mouth every 8 (eight) hours as needed. Patient not taking: Reported on 08/24/2015 10/12/14   Ambrose FinlandValerie A Keck, NP  sertraline (ZOLOFT) 50 MG tablet Take 1 tablet (50 mg total) by mouth daily. 08/24/15   Anders SimmondsAngela M McClung, PA-C     Objective:  EXAM:   Filed Vitals:   08/24/15 1100  BP: 108/71  Pulse: 70  Temp: 97.6 F (36.4 C)  TempSrc: Oral  Resp: 16  Height: 4\' 11"  (1.499 m)  Weight: 135 lb 12.8 oz (61.598 kg)  SpO2: 98%    General appearance : A&OX3. NAD. Non-toxic-appearing HEENT: Atraumatic and Normocephalic.  PERRLA. EOM intact. B conjunctivae with mild erythema.  No purulent discharge or drainage. TM clear B.  Nose with boggy and pale, enlarged turbinates.  Mouth-MMM, post pharynx WNL w/o erythema, No PND. Neck: supple, no JVD. No cervical lymphadenopathy. No thyromegaly Chest/Lungs:  Breathing-non-labored, Good air entry bilaterally, breath sounds normal without rales, rhonchi, or wheezing  CVS: S1 S2 regular, no murmurs, gallops, rubs. Extremities: Bilateral Lower Ext shows no edema, both legs are  warm to touch with = pulse throughout Neurology:  CN II-XII grossly intact, Non focal.   Psych:  TP linear. J/I WNL. Normal speech. Appropriate eye contact and affect.  Skin:  No Rash  Data Review Lab Results  Component Value Date   HGBA1C  02/16/2010    5.6 (NOTE)                                                                       According to the ADA Clinical Practice Recommendations for 2011, when HbA1c is used as a screening test:   >=6.5%   Diagnostic of Diabetes Mellitus           (if abnormal result  is confirmed)  5.7-6.4%    Increased risk of developing Diabetes Mellitus  References:Diagnosis and Classification of Diabetes Mellitus,Diabetes Care,2011,34(Suppl 1):S62-S69 and Standards of Medical Care in         Diabetes - 2011,Diabetes Care,2011,34  (Suppl 1):S11-S61.   HGBA1C  02/15/2010    5.6 (NOTE)                                                                       According to the ADA Clinical Practice Recommendations for 2011, when HbA1c is used as a screening test:   >=6.5%   Diagnostic of Diabetes Mellitus           (if abnormal result  is confirmed)  5.7-6.4%   Increased risk of developing Diabetes Mellitus  References:Diagnosis and Classification of Diabetes Mellitus,Diabetes Care,2011,34(Suppl 1):S62-S69 and Standards of Medical Care in         Diabetes - 2011,Diabetes Care,2011,34  (Suppl 1):S11-S61.     Assessment & Plan   1. Other fatigue Check labs - Comprehensive metabolic panel - Thyroid Panel With TSH  2. Hot flashes Start Zoloft to help with irritability.  Use drysol as directed to minimize perspiration - Comprehensive metabolic panel - FSH/LH - VITAMIN D 25 Hydroxy (Vit-D Deficiency, Fractures)  3. Sleep disturbance Increase melatonin to  at bedtime.  Consider HRT or trazadone pending labs. - Comprehensive metabolic panel  4. Amenorrhea Suspect peri-menopausal. - POCT urine pregnancy-neg - Comprehensive metabolic panel - FSH/LH - Thyroid Panel With TSH  5. Seasonal allergies Zyrtec or Claritin  6. Perimenopausal For vaginal dryness-use astroglide.  Consider pelvis/wet prep at next OV if vaginal odor persists. Also consider estrace/vaginal HRT. Adequate hydration and water intake encouraged.    Patient have been counseled extensively about nutrition and exercise  Return in about 3 weeks (around 09/14/2015) for with me for hormone follow up and pelvic exam-schedule 30-45 mins.  The patient was given clear instructions to go to ER or return to medical center if symptoms  don't improve, worsen or new problems develop. The patient verbalized understanding. The patient was told to call to get lab results if they haven't heard anything in the next week.   Georgian Co, PA-C [facility] [place], [place] [phone]   08/24/2015, 12:03  PM

## 2015-08-24 NOTE — Progress Notes (Signed)
Patient's here c/o hot flashes.   Patient c/o feeling irritated and bodyaches with lack of sleep.   Patient reports this has been going on for 1 yr now.  Patient c/o irritation to her eyes with redness.

## 2015-08-24 NOTE — Patient Instructions (Addendum)
Vitamin D 2000 units daily Calcium 1200 mg daily Multi-vitamin daily   Zyrtec or Claritin for allergies   Zoloft/sertraline-take 1/2 tablet daily first few days then increase to 1 tablet daily

## 2015-08-25 LAB — FSH/LH
FSH: 52.9 m[IU]/mL
LH: 45.2 m[IU]/mL

## 2015-08-25 LAB — VITAMIN D 25 HYDROXY (VIT D DEFICIENCY, FRACTURES): Vit D, 25-Hydroxy: 31 ng/mL (ref 30–100)

## 2015-08-29 ENCOUNTER — Telehealth: Payer: Self-pay

## 2015-08-29 NOTE — Telephone Encounter (Signed)
Spoke with patient, patient verified name and DOB. Patient was given lab results verbalize understanding with no further questions. 

## 2015-08-29 NOTE — Telephone Encounter (Signed)
-----   Message from Anders SimmondsAngela M McClung, [place] sent at 08/28/2015  5:43 PM EDT ----- Please call patient.  Her blood work does look consistent with her being menopausal.  The bloodwork doesn't alter any thing in our current plan for managing her signs and symptoms. Tell her I will discuss it more with her at her follow-up in a few weeks.  Thanks! Marylene LandAngela

## 2015-09-05 ENCOUNTER — Ambulatory Visit: Payer: Self-pay

## 2015-09-15 ENCOUNTER — Ambulatory Visit: Payer: Self-pay

## 2015-12-13 ENCOUNTER — Ambulatory Visit: Payer: Self-pay | Attending: Internal Medicine

## 2016-01-25 ENCOUNTER — Encounter: Payer: Self-pay | Admitting: Family Medicine

## 2016-01-25 ENCOUNTER — Ambulatory Visit: Payer: Self-pay | Attending: Family Medicine | Admitting: Family Medicine

## 2016-01-25 VITALS — BP 111/71 | HR 66 | Temp 97.9°F | Ht 59.0 in | Wt 139.6 lb

## 2016-01-25 DIAGNOSIS — Z131 Encounter for screening for diabetes mellitus: Secondary | ICD-10-CM | POA: Insufficient documentation

## 2016-01-25 DIAGNOSIS — N951 Menopausal and female climacteric states: Secondary | ICD-10-CM | POA: Insufficient documentation

## 2016-01-25 DIAGNOSIS — N898 Other specified noninflammatory disorders of vagina: Secondary | ICD-10-CM

## 2016-01-25 DIAGNOSIS — Z0000 Encounter for general adult medical examination without abnormal findings: Secondary | ICD-10-CM | POA: Insufficient documentation

## 2016-01-25 DIAGNOSIS — Z23 Encounter for immunization: Secondary | ICD-10-CM | POA: Insufficient documentation

## 2016-01-25 DIAGNOSIS — A499 Bacterial infection, unspecified: Secondary | ICD-10-CM | POA: Insufficient documentation

## 2016-01-25 DIAGNOSIS — N9489 Other specified conditions associated with female genital organs and menstrual cycle: Secondary | ICD-10-CM | POA: Insufficient documentation

## 2016-01-25 DIAGNOSIS — B9689 Other specified bacterial agents as the cause of diseases classified elsewhere: Secondary | ICD-10-CM

## 2016-01-25 DIAGNOSIS — N952 Postmenopausal atrophic vaginitis: Secondary | ICD-10-CM | POA: Insufficient documentation

## 2016-01-25 DIAGNOSIS — N760 Acute vaginitis: Secondary | ICD-10-CM | POA: Insufficient documentation

## 2016-01-25 LAB — POCT URINALYSIS DIPSTICK
Bilirubin, UA: NEGATIVE
Glucose, UA: NEGATIVE
Ketones, UA: NEGATIVE
Leukocytes, UA: NEGATIVE
NITRITE UA: NEGATIVE
PH UA: 8.5
PROTEIN UA: 30
RBC UA: NEGATIVE
Spec Grav, UA: 1.01
UROBILINOGEN UA: 1

## 2016-01-25 LAB — POCT GLYCOSYLATED HEMOGLOBIN (HGB A1C): Hemoglobin A1C: 5.4

## 2016-01-25 LAB — GLUCOSE, POCT (MANUAL RESULT ENTRY): POC GLUCOSE: 106 mg/dL — AB (ref 70–99)

## 2016-01-25 MED ORDER — ESTRADIOL 0.1 MG/GM VA CREA: TOPICAL_CREAM | VAGINAL | 12 refills | Status: DC

## 2016-01-25 NOTE — Assessment & Plan Note (Signed)
Perimenopausal vaginal atrophy suspected  Plan: LH/FSH Vaginal estrogen cream

## 2016-01-25 NOTE — Progress Notes (Signed)
Subjective:  Patient ID: Marissa Garcia, female    DOB: Jun 28, 1965  Age: 50 y.o. MRN: [id number]  CC: Establish Care (vaginal problems)   HPI Marissa Garcia presents for   1. Vaginal odor and pain with intercourse: one year of vaginal odor and pain with intercourse. She has irregular periods about every 3 months. Scant discharge. No vaginal lesions. She has lost interested in sex because it is uncomfortable. She has no hx of STI. She had normal pap in 10/2014.   Social History  Substance Use Topics  . Smoking status: Never Smoker  . Smokeless tobacco: Not on file  . Alcohol use No    Outpatient Medications Prior to Visit  Medication Sig Dispense Refill  . Melatonin 3 MG TABS Take 1.5 mg by mouth at bedtime as needed (for sleep).    Marland Kitchen aluminum chloride (DRYSOL) 20 % external solution Apply topically at bedtime. (Patient not taking: Reported on 01/25/2016) 35 mL 0  . cyclobenzaprine (FLEXERIL) 10 MG tablet Take 1 tablet (10 mg total) by mouth 2 (two) times daily as needed for muscle spasms. (Patient not taking: Reported on 01/25/2016) 30 tablet 0  . ibuprofen (ADVIL,MOTRIN) 600 MG tablet Take 1 tablet (600 mg total) by mouth every 8 (eight) hours as needed. (Patient not taking: Reported on 01/25/2016) 60 tablet 1  . sertraline (ZOLOFT) 50 MG tablet Take 1 tablet (50 mg total) by mouth daily. (Patient not taking: Reported on 01/25/2016) 30 tablet 3   No facility-administered medications prior to visit.     ROS Review of Systems  Constitutional: Negative for chills and fever.  Eyes: Negative for visual disturbance.  Respiratory: Negative for shortness of breath.   Cardiovascular: Negative for chest pain.  Gastrointestinal: Negative for abdominal pain and blood in stool.  Musculoskeletal: Negative for arthralgias and back pain.  Skin: Negative for rash.  Allergic/Immunologic: Negative for immunocompromised state.  Hematological: Negative for adenopathy. Does not bruise/bleed easily.    Psychiatric/Behavioral: Negative for dysphoric mood and suicidal ideas.    Objective:  BP 111/71 (BP Location: Left Arm, Patient Position: Sitting, Cuff Size: Small)   Pulse 66   Temp 97.9 F (36.6 C) (Oral)   Ht 4\' 11"  (1.499 m)   Wt 139 lb 9.6 oz (63.3 kg)   LMP 01/04/2016   SpO2 96%   BMI 28.20 kg/m   BP/Weight 01/25/2016 08/24/2015 01/12/2015  Systolic BP 111 108 104  Diastolic BP 71 71 65  Wt. (Lbs) 139.6 135.8 143  BMI 28.2 27.41 30.94   Physical Exam  Constitutional: She appears well-developed and well-nourished. No distress.  Cardiovascular: Normal rate, regular rhythm, normal heart sounds and intact distal pulses.   Pulmonary/Chest: Effort normal and breath sounds normal.  Genitourinary: Uterus normal. Pelvic exam was performed with patient prone. There is no rash, tenderness or lesion on the right labia. There is no rash, tenderness or lesion on the left labia. Cervix exhibits no motion tenderness, no discharge and no friability. Vaginal discharge (scant vaginal discharge ) found.  Genitourinary Comments: Pain in insertion of speculum and bimanual exam in vagina With tightness of vaginal tissue   Musculoskeletal: She exhibits no edema.  Lymphadenopathy:       Right: No inguinal adenopathy present.       Left: No inguinal adenopathy present.  Skin: Skin is warm and dry. No rash noted.  UA: cloudy, 30 protein, otherwise negative  CBG 106  Lab Results  Component Value Date   HGBA1C 5.4 01/25/2016  Assessment & Plan:  Marissa Garcia was seen today for establish care.  Diagnoses and all orders for this visit:  Healthcare maintenance  Vaginal odor -     Cervicovaginal ancillary only  Atrophic vulvovaginitis -     POCT urinalysis dipstick -     Discontinue: estradiol (ESTRACE VAGINAL) 0.1 MG/GM vaginal cream; Insert 2 gram nightly for two weeks, then 1 gram nightly for two weeks, then 1 gram 1-3 times per week -     FSH/LH -     estradiol (ESTRACE VAGINAL) 0.1 MG/GM  vaginal cream; Insert 2 gram nightly for two weeks, then 1 gram nightly for two weeks, then 1 gram 1-3 times per week  Perimenopausal  Screening for diabetes mellitus (DM) -     HgB A1c -     Glucose (CBG)  Encounter for immunization -     POCT urinalysis dipstick -     Cervicovaginal ancillary only -     FSH/LH -     estradiol (ESTRACE VAGINAL) 0.1 MG/GM vaginal cream; Insert 2 gram nightly for two weeks, then 1 gram nightly for two weeks, then 1 gram 1-3 times per week -     HgB A1c -     Glucose (CBG) -     Flu Vaccine QUAD 36+ mos IM  Needs flu shot -     Flu Vaccine QUAD 36+ mos IM     Meds ordered this encounter  Medications  . DISCONTD: estradiol (ESTRACE VAGINAL) 0.1 MG/GM vaginal cream    Sig: Insert 2 gram nightly for two weeks, then 1 gram nightly for two weeks, then 1 gram 1-3 times per week    Dispense:  42.5 g    Refill:  12  . estradiol (ESTRACE VAGINAL) 0.1 MG/GM vaginal cream    Sig: Insert 2 gram nightly for two weeks, then 1 gram nightly for two weeks, then 1 gram 1-3 times per week    Dispense:  42.5 g    Refill:  12    Follow-up: Return in about 2 months (around 03/26/2016) for dyspareunia .   Dessa PhiJosalyn Aneth Schlagel MD

## 2016-01-25 NOTE — Patient Instructions (Addendum)
Marissa Garcia was seen today for establish care.  Diagnoses and all orders for this visit:  Healthcare maintenance -     POCT urinalysis dipstick  Vaginal odor -     Cervicovaginal ancillary only  Atrophic vulvovaginitis -     Discontinue: estradiol (ESTRACE VAGINAL) 0.1 MG/GM vaginal cream; Insert 2 gram nightly for two weeks, then 1 gram nightly for two weeks, then 1 gram 1-3 times per week -     FSH/LH    While awaiting results and if estrogen level turns out not to be low, I recommend OTC vaginal lubricant like KY Jelly during intercourse  Dr. Armen PickupFunches

## 2016-01-25 NOTE — Progress Notes (Signed)
Pt wants to check for diabetes. Pt wants flu shot today.

## 2016-01-26 LAB — FSH/LH
FSH: 84.7 m[IU]/mL
LH: 42.3 m[IU]/mL

## 2016-01-26 MED FILL — !ESTRACE 0.01% CREAM: 0.01% | 7 days supply | Qty: 12 | Fill #0

## 2016-01-29 LAB — CERVICOVAGINAL ANCILLARY ONLY
CHLAMYDIA, DNA PROBE: NEGATIVE
NEISSERIA GONORRHEA: NEGATIVE
Wet Prep (BD Affirm): POSITIVE — AB

## 2016-01-29 MED ORDER — FLUCONAZOLE 150 MG PO TABS: 150.0000 mg | ORAL_TABLET | Freq: Once | ORAL | 0 refills | Status: AC

## 2016-01-29 MED ORDER — METRONIDAZOLE 500 MG PO TABS: 500.0000 mg | ORAL_TABLET | Freq: Two times a day (BID) | ORAL | 0 refills | Status: DC

## 2016-01-29 MED FILL — metroNIDAZOLE 500 MG TABS: 500 | 7 days supply | Qty: 14 | Fill #0

## 2016-01-29 MED FILL — FLUCONAZOLE 150 MG TABLET: 150 | 1 days supply | Qty: 1 | Fill #0

## 2016-01-29 NOTE — Addendum Note (Signed)
Addended by: Dessa PhiFUNCHES, Samon Dishner on: 01/29/2016 08:48 AM   Modules accepted: Orders

## 2016-01-30 ENCOUNTER — Telehealth: Payer: Self-pay

## 2016-01-30 NOTE — Telephone Encounter (Signed)
VM was left for pt to return phone call for results.

## 2016-01-31 ENCOUNTER — Telehealth: Payer: Self-pay

## 2016-01-31 NOTE — Telephone Encounter (Signed)
Pt was called on 9/20 and informed of results and medications being sent over to the pharmacy.

## 2016-03-04 MED FILL — !ESTRACE 0.01% CREAM: 0.01% | 27 days supply | Qty: 48 | Fill #1

## 2016-03-19 ENCOUNTER — Encounter: Payer: Self-pay | Admitting: Family Medicine

## 2016-03-19 ENCOUNTER — Ambulatory Visit: Payer: Self-pay | Attending: Family Medicine | Admitting: Family Medicine

## 2016-03-19 VITALS — BP 104/66 | HR 61 | Temp 97.8°F | Ht 59.0 in | Wt 141.0 lb

## 2016-03-19 DIAGNOSIS — N951 Menopausal and female climacteric states: Secondary | ICD-10-CM

## 2016-03-19 DIAGNOSIS — N952 Postmenopausal atrophic vaginitis: Secondary | ICD-10-CM | POA: Insufficient documentation

## 2016-03-19 DIAGNOSIS — Z79899 Other long term (current) drug therapy: Secondary | ICD-10-CM | POA: Insufficient documentation

## 2016-03-19 DIAGNOSIS — Z23 Encounter for immunization: Secondary | ICD-10-CM

## 2016-03-19 DIAGNOSIS — Z780 Asymptomatic menopausal state: Secondary | ICD-10-CM | POA: Insufficient documentation

## 2016-03-19 MED ORDER — ZOLPIDEM TARTRATE 5 MG PO TABS: 5.0000 mg | ORAL_TABLET | Freq: Every evening | ORAL | 1 refills | Status: DC | PRN

## 2016-03-19 MED ORDER — ESTRADIOL 0.1 MG/GM VA CREA: 0.2500 | TOPICAL_CREAM | VAGINAL | 12 refills | Status: DC

## 2016-03-19 NOTE — Assessment & Plan Note (Signed)
Advised low carb diet to reduce abdominal weight gain Ambien for insomnia

## 2016-03-19 NOTE — Assessment & Plan Note (Signed)
Perimenopausal atrophy Continue estrace 3 times per week

## 2016-03-19 NOTE — Progress Notes (Signed)
Pt states that she is not sleeping well.

## 2016-03-19 NOTE — Progress Notes (Signed)
Subjective:  Patient ID: Marissa Garcia, female    DOB: January 26, 1966  Age: 50 y.o. MRN: [id number]  CC: Dyspareunia   HPI Marissa Garcia presents for   1. Dyspareunia: one year of pain with intercourse. She has perimenopausal range FSH/LH. I started her on estrace cream last office visit. She is using estrace 3 times weekly. She reports trouble sleeping some nights with waking up after 2 hrs. She has weight gain around her waist which she finds distressing. Not hot flashes.    Social History  Substance Use Topics  . Smoking status: Never Smoker  . Smokeless tobacco: Not on file  . Alcohol use No    Outpatient Medications Prior to Visit  Medication Sig Dispense Refill  . estradiol (ESTRACE VAGINAL) 0.1 MG/GM vaginal cream Insert 2 gram nightly for two weeks, then 1 gram nightly for two weeks, then 1 gram 1-3 times per week 42.5 g 12  . Melatonin 3 MG TABS Take 1.5 mg by mouth at bedtime as needed (for sleep).    . metroNIDAZOLE (FLAGYL) 500 MG tablet Take 1 tablet (500 mg total) by mouth 2 (two) times daily. 14 tablet 0   No facility-administered medications prior to visit.     ROS Review of Systems  Constitutional: Negative for chills and fever.  Eyes: Negative for visual disturbance.  Respiratory: Negative for shortness of breath.   Cardiovascular: Negative for chest pain.  Gastrointestinal: Negative for abdominal pain and blood in stool.  Musculoskeletal: Negative for arthralgias and back pain.  Skin: Negative for rash.  Allergic/Immunologic: Negative for immunocompromised state.  Hematological: Negative for adenopathy. Does not bruise/bleed easily.  Psychiatric/Behavioral: Positive for sleep disturbance. Negative for dysphoric mood and suicidal ideas.    Objective:  BP 104/66 (BP Location: Left Arm, Patient Position: Sitting, Cuff Size: Small)   Pulse 61   Temp 97.8 F (36.6 C) (Oral)   Ht 4\' 11"  (1.499 m)   Wt 141 lb (64 kg)   LMP 03/19/2016   SpO2 98%   BMI 28.48  kg/m   BP/Weight 03/19/2016 01/25/2016 08/24/2015  Systolic BP 104 111 108  Diastolic BP 66 71 71  Wt. (Lbs) 141 139.6 135.8  BMI 28.48 28.2 27.41   Physical Exam  Constitutional: She appears well-developed and well-nourished. No distress.  Cardiovascular: Normal rate, regular rhythm, normal heart sounds and intact distal pulses.   Pulmonary/Chest: Effort normal and breath sounds normal.  Genitourinary: Pelvic exam was performed with patient prone. Cervix exhibits no motion tenderness, no discharge and no friability.  Musculoskeletal: She exhibits no edema.  Skin: Skin is warm and dry. No rash noted.    Assessment & Plan:  Marissa Garcia was seen today for dyspareunia.  Diagnoses and all orders for this visit:  Perimenopausal -     estradiol (ESTRACE VAGINAL) 0.1 MG/GM vaginal cream; Place 0.25 Applicatorfuls vaginally 3 (three) times a week. -     zolpidem (AMBIEN) 5 MG tablet; Take 1 tablet (5 mg total) by mouth at bedtime as needed for sleep.  Atrophic vulvovaginitis -     estradiol (ESTRACE VAGINAL) 0.1 MG/GM vaginal cream; Place 0.25 Applicatorfuls vaginally 3 (three) times a week.  Other orders -     Tdap vaccine greater than or equal to 7yo IM     Meds ordered this encounter  Medications  . estradiol (ESTRACE VAGINAL) 0.1 MG/GM vaginal cream    Sig: Place 0.25 Applicatorfuls vaginally 3 (three) times a week.    Dispense:  42.5 g  Refill:  12  . zolpidem (AMBIEN) 5 MG tablet    Sig: Take 1 tablet (5 mg total) by mouth at bedtime as needed for sleep.    Dispense:  15 tablet    Refill:  1    Follow-up: No Follow-up on file.   Dessa PhiJosalyn Javae Braaten MD

## 2016-03-19 NOTE — Patient Instructions (Addendum)
Marissa Garcia was seen today for dyspareunia.  Diagnoses and all orders for this visit:  Perimenopausal -     estradiol (ESTRACE VAGINAL) 0.1 MG/GM vaginal cream; Place 0.25 Applicatorfuls vaginally 3 (three) times a week.  Atrophic vulvovaginitis -     estradiol (ESTRACE VAGINAL) 0.1 MG/GM vaginal cream; Place 0.25 Applicatorfuls vaginally 3 (three) times a week.   F/u in  3 months for perimenopause   Dr. Armen PickupFunches   Perimenopausia (Perimenopause) La perimenopausia es el momento en que su cuerpo comienza a pasar a la menopausia (sin menstruacin durante 12 meses consecutivos). Es un proceso natural. La perimenopausia puede comenzar entre 2 y 8 aos antes de la menopausia y por lo general tiene una duracin de 1 ao ms pasada la menopausia. Starbucks CorporationDurante este tiempo, los ovarios podran producir un vulo o no. Los ovarios varan su produccin de las hormonas estrgeno y Psychiatric nurseprogesterona cada mes. Esto puede causar perodos menstruales irregulares, dificultad para quedar embarazada, hemorragia vaginal entre perodos y sntomas incmodos. CAUSAS  Produccin irregular de las hormonas ovricas estrgeno y Education officer, museumprogesterona, y no ovular todos los meses.  Otras causas son:  Tumor de la glndula pituitaria.  Enfermedades que Ameren Corporationafectan los ovarios.  Radioterapia.  Quimioterapia.  Causas desconocidas.  Fumar mucho y abusar del consumo de alcohol puede llevar a que la perimenopausia aparezca antes. SIGNOS Y SNTOMAS   Acaloramiento.  Sudoracin nocturna.  Perodos menstruales irregulares.  Disminucin del deseo sexual.  Sequedad vaginal.  Dolores de cabeza.  Cambios en el estado de nimo.  Depresin.  Problemas de memoria.  Irritabilidad.  Cansancio.  Aumento de Confluencepeso.  Problemas para quedar embarazada.  Prdida de clulas seas (osteoporosis).  Comienzo de endurecimiento de las arterias (aterosclerosis). DIAGNSTICO  El mdico realizar un diagnstico en funcin de su edad, historial de  perodos menstruales y sntomas. Le realizarn un examen fsico para ver si hay algn cambio en su cuerpo, en especial en sus rganos reproductores. Las pruebas hormonales pueden ser o no tiles segn la cantidad de hormonas femeninas que produzca y Hartford Financialcundo las produzca. Sin embargo, podrn Tree surgeonrealizarse otras pruebas hormonales para Music therapistdetectar otros problemas. TRATAMIENTO  En algunos casos, no se necesita tratamiento. La decisin acerca de qu tratamiento es necesario durante la perimenopausia deber realizarse en conjunto con su mdico segn cmo estn afectando los sntomas a su estilo de vida. Existen varios tratamientos disponibles, como:  Warehouse managerTratar cada sntoma individual con medicamentos especficos para ese sntoma.  Algunos medicamentos herbales pueden ayudar en sntomas especficos.  Psicoterapia.  Terapia grupal. INSTRUCCIONES PARA EL CUIDADO EN EL HOGAR   Controle sus periodos menstruales (cundo ocurren, qu tan abundantes son, cunto tiempo pasa entre perodos, y cunto duran) como tambin sus sntomas y cundo comenzaron.  Tome slo medicamentos de venta libre o recetados, segn las indicaciones del mdico.  Duerma y descanse.  Haga actividad fsica.  Consuma una dieta que contenga calcio (bueno para los Duncanhuesos) y productos derivados de la soja (actan como estrgenos).  No fume.  Evite las bebidas alcohlicas.  Tome los suplementos vitamnicos segn las indicaciones del mdico. En ciertos casos, puede ser de [place] Vincent and the Grenadinesayuda tomar vitamina E.  Tome suplementos de calcio y vitamina D para ayudar a Research scientist (medical)prevenir la prdida sea.  En algunos casos la terapia de grupo podr ayudarla.  La acupuntura puede ser de ayuda en ciertos casos. SOLICITE ATENCIN MDICA SI:   Tiene preguntas acerca de sus sntomas.  Necesita ser derivada a un especialista (gineclogo, psiquiatra, o psiclogo). SOLICITE ATENCIN MDICA DE INMEDIATO SI:  Sufre una hemorragia vaginal abundante.  Su perodo menstrual  dura ms de 8 das.  Sus perodos son recurrentes cada menos de [address] das.  Tiene hemorragias durante las The St. Paul Travelersrelaciones sexuales.  Est muy deprimido.  Siente dolor al ConocoPhillipsorinar.  Siente dolor de cabeza intenso.  Tiene problemas de visin.   Esta informacin no tiene Theme park managercomo fin reemplazar el consejo del mdico. Asegrese de hacerle al mdico cualquier pregunta que tenga.   Document Released: 04/29/2005 Document Revised: 02/17/2013 Elsevier Interactive Patient Education [employer]2016 Elsevier Inc.

## 2016-04-24 ENCOUNTER — Other Ambulatory Visit: Payer: Self-pay

## 2016-04-24 DIAGNOSIS — N952 Postmenopausal atrophic vaginitis: Secondary | ICD-10-CM

## 2016-04-24 DIAGNOSIS — N951 Menopausal and female climacteric states: Secondary | ICD-10-CM

## 2016-04-24 MED ORDER — ESTRADIOL 0.1 MG/GM VA CREA: 0.2500 | TOPICAL_CREAM | VAGINAL | 3 refills | Status: DC

## 2016-09-25 ENCOUNTER — Encounter: Payer: Self-pay | Admitting: Family Medicine

## 2017-07-01 ENCOUNTER — Other Ambulatory Visit: Payer: Self-pay | Admitting: Obstetrics and Gynecology

## 2017-07-01 DIAGNOSIS — Z1231 Encounter for screening mammogram for malignant neoplasm of breast: Secondary | ICD-10-CM

## 2017-07-24 ENCOUNTER — Ambulatory Visit (HOSPITAL_COMMUNITY)
Admission: RE | Admit: 2017-07-24 | Discharge: 2017-07-24 | Disposition: A | Discharge status: Home / Self Care | Payer: Self-pay | Source: Ambulatory Visit | Attending: Obstetrics and Gynecology | Admitting: Obstetrics and Gynecology

## 2017-07-24 ENCOUNTER — Ambulatory Visit
Admission: RE | Admit: 2017-07-24 | Discharge: 2017-07-24 | Disposition: A | Discharge status: Home / Self Care | Payer: No Typology Code available for payment source | Source: Ambulatory Visit | Attending: Obstetrics and Gynecology | Admitting: Obstetrics and Gynecology

## 2017-07-24 ENCOUNTER — Other Ambulatory Visit (HOSPITAL_COMMUNITY): Payer: Self-pay

## 2017-07-24 ENCOUNTER — Encounter (HOSPITAL_COMMUNITY): Payer: Self-pay

## 2017-07-24 VITALS — BP 118/68

## 2017-07-24 DIAGNOSIS — R229 Localized swelling, mass and lump, unspecified: Principal | ICD-10-CM

## 2017-07-24 DIAGNOSIS — N6321 Unspecified lump in the left breast, upper outer quadrant: Secondary | ICD-10-CM

## 2017-07-24 DIAGNOSIS — IMO0002 Reserved for concepts with insufficient information to code with codable children: Secondary | ICD-10-CM

## 2017-07-24 DIAGNOSIS — Z1239 Encounter for other screening for malignant neoplasm of breast: Secondary | ICD-10-CM

## 2017-07-24 DIAGNOSIS — Z1231 Encounter for screening mammogram for malignant neoplasm of breast: Secondary | ICD-10-CM

## 2017-07-24 NOTE — Patient Instructions (Signed)
Explained breast self awareness with Algis GreenhouseSara Cowger. Patient did not need a Pap smear today due to last Pap smear and HPV typing was 10/12/2014. Let her know [facility] will cover Pap smears and HPV typing every 5 years unless has a history of abnormal Pap smears. [facility] and possible left breast ultrasound. Appointment scheduled for Thursday, July 24, 2017 at 1440. Patient aware of appointment and will be there. Algis GreenhouseSara Vonderhaar verbalized understanding.  Jerauld Bostwick, Kathaleen Maserhristine Poll, RN 12:12 PM

## 2017-07-24 NOTE — Progress Notes (Signed)
Complaints of left breast lump x 6 months.   Pap Smear: Pap smear not completed today. [facility] and normal with negative HPV. Per patient has no history of an abnormal Pap smear. Last Pap smear result is in Epic.  Physical exam: Breasts Breasts symmetrical. No skin abnormalities bilateral breasts. No nipple retraction bilateral breasts. No nipple discharge bilateral breasts. No lymphadenopathy. No lumps palpated right breast. Palpated a pea sized mobile lump within the left breast at 1 o'clock 4 cm from the nipple. Complaints of tenderness when palpated lump. [facility] mamm[facility] and possible left breast ultrasound. Appointment scheduled for Thursday, July 24, 2017 at 1440.        Pelvic/Bimanual No Pap smear completed today since last Pap smear and HPV typing was 10/12/2014. Pap smear not indicated per [facility]CP guidelines.   Smoking History: Patient has never smoked.  Patient Navigation: Patient education provided. Access to services provided for patient through [facility]. Spanish interpreter provided.   Colorectal Cancer Screening: Per patient has never had a colonoscopy completed. No complaints today. FIT Test given to patient to complete and return to [facility].  Breast and Cervical Cancer Risk Assessment: Patient has a family history of her maternal great aunt being diagnosed with breast cancer. Patient has no known genetic mutations or history of radiation treatment to the chest before age 52. Patient has no history of cervical dysplasia, immunocompromised, or DES exposure in-utero.  Used Spanish interpreter Celanese CorporationErika McReynolds from [employer].

## 2017-07-25 ENCOUNTER — Encounter (HOSPITAL_COMMUNITY): Payer: Self-pay | Admitting: *Deleted

## 2017-07-28 ENCOUNTER — Other Ambulatory Visit: Payer: Self-pay | Admitting: Obstetrics and Gynecology

## 2017-08-01 LAB — FECAL OCCULT BLOOD, IMMUNOCHEMICAL: Fecal Occult Bld: NEGATIVE

## 2017-08-11 ENCOUNTER — Encounter (HOSPITAL_COMMUNITY): Payer: Self-pay

## 2018-10-13 ENCOUNTER — Emergency Department (HOSPITAL_COMMUNITY): Payer: Medicaid Other

## 2018-10-13 ENCOUNTER — Other Ambulatory Visit: Payer: Self-pay

## 2018-10-13 ENCOUNTER — Emergency Department (HOSPITAL_COMMUNITY)
Admission: EM | Admit: 2018-10-13 | Discharge: 2018-10-14 | Disposition: A | Discharge status: Home / Self Care | Payer: Medicaid Other | Attending: Emergency Medicine | Admitting: Emergency Medicine

## 2018-10-13 ENCOUNTER — Encounter (HOSPITAL_COMMUNITY): Payer: Self-pay | Admitting: Emergency Medicine

## 2018-10-13 DIAGNOSIS — Y999 Unspecified external cause status: Secondary | ICD-10-CM | POA: Insufficient documentation

## 2018-10-13 DIAGNOSIS — W19XXXA Unspecified fall, initial encounter: Secondary | ICD-10-CM

## 2018-10-13 DIAGNOSIS — Z209 Contact with and (suspected) exposure to unspecified communicable disease: Secondary | ICD-10-CM | POA: Diagnosis not present

## 2018-10-13 DIAGNOSIS — M545 Low back pain: Secondary | ICD-10-CM | POA: Insufficient documentation

## 2018-10-13 DIAGNOSIS — S22081A Stable burst fracture of T11-T12 vertebra, initial encounter for closed fracture: Secondary | ICD-10-CM

## 2018-10-13 DIAGNOSIS — W11XXXA Fall on and from ladder, initial encounter: Secondary | ICD-10-CM | POA: Insufficient documentation

## 2018-10-13 DIAGNOSIS — Y939 Activity, unspecified: Secondary | ICD-10-CM | POA: Diagnosis not present

## 2018-10-13 DIAGNOSIS — Y929 Unspecified place or not applicable: Secondary | ICD-10-CM | POA: Insufficient documentation

## 2018-10-13 DIAGNOSIS — J80 Acute respiratory distress syndrome: Secondary | ICD-10-CM | POA: Diagnosis not present

## 2018-10-13 DIAGNOSIS — R61 Generalized hyperhidrosis: Secondary | ICD-10-CM | POA: Diagnosis not present

## 2018-10-13 DIAGNOSIS — R51 Headache: Secondary | ICD-10-CM | POA: Diagnosis not present

## 2018-10-13 DIAGNOSIS — R52 Pain, unspecified: Secondary | ICD-10-CM | POA: Diagnosis not present

## 2018-10-13 HISTORY — DX: Stable burst fracture of t11-T12 vertebra, initial encounter for closed fracture: S22.081A

## 2018-10-13 LAB — CBC WITH DIFFERENTIAL/PLATELET
Abs Immature Granulocytes: 0.09 10*3/uL — ABNORMAL HIGH (ref 0.00–0.07)
Basophils Absolute: 0 10*3/uL (ref 0.0–0.1)
Basophils Relative: 0 %
Eosinophils Absolute: 0.1 10*3/uL (ref 0.0–0.5)
Eosinophils Relative: 1 %
HCT: 36.2 % (ref 36.0–46.0)
Hemoglobin: 12.2 g/dL (ref 12.0–15.0)
Immature Granulocytes: 1 %
Lymphocytes Relative: 26 %
Lymphs Abs: 2.8 10*3/uL (ref 0.7–4.0)
MCH: 31.1 pg (ref 26.0–34.0)
MCHC: 33.7 g/dL (ref 30.0–36.0)
MCV: 92.3 fL (ref 80.0–100.0)
Monocytes Absolute: 0.5 10*3/uL (ref 0.1–1.0)
Monocytes Relative: 4 %
Neutro Abs: 7.4 10*3/uL (ref 1.7–7.7)
Neutrophils Relative %: 68 %
Platelets: 284 10*3/uL (ref 150–400)
RBC: 3.92 MIL/uL (ref 3.87–5.11)
RDW: 13.1 % (ref 11.5–15.5)
WBC: 11 10*3/uL — ABNORMAL HIGH (ref 4.0–10.5)
nRBC: 0 % (ref 0.0–0.2)

## 2018-10-13 LAB — LACTIC ACID, PLASMA: Lactic Acid, Venous: 1.6 mmol/L (ref 0.5–1.9)

## 2018-10-13 LAB — PROTIME-INR
INR: 1 (ref 0.8–1.2)
Prothrombin Time: 13.5 seconds (ref 11.4–15.2)

## 2018-10-13 LAB — COMPREHENSIVE METABOLIC PANEL
ALT: 23 U/L (ref 0–44)
AST: 32 U/L (ref 15–41)
Albumin: 3.7 g/dL (ref 3.5–5.0)
Alkaline Phosphatase: 56 U/L (ref 38–126)
Anion gap: 8 (ref 5–15)
BUN: 16 mg/dL (ref 6–20)
CO2: 23 mmol/L (ref 22–32)
Calcium: 8.9 mg/dL (ref 8.9–10.3)
Chloride: 106 mmol/L (ref 98–111)
Creatinine, Ser: 0.63 mg/dL (ref 0.44–1.00)
GFR calc Af Amer: >60 mL/min (ref >60)
GFR calc non Af Amer: >60 mL/min (ref >60)
Glucose, Bld: 117 mg/dL — ABNORMAL HIGH (ref 70–99)
Potassium: 3.5 mmol/L (ref 3.5–5.1)
Sodium: 137 mmol/L (ref 135–145)
Total Bilirubin: 0.6 mg/dL (ref 0.3–1.2)
Total Protein: 6.8 g/dL (ref 6.5–8.1)

## 2018-10-13 LAB — I-STAT BETA HCG BLOOD, ED (MC, WL, AP ONLY): I-stat hCG, quantitative: <5 m[IU]/mL (ref <5)

## 2018-10-13 LAB — I-STAT CREATININE, ED: Creatinine, Ser: 0.5 mg/dL (ref 0.44–1.00)

## 2018-10-13 IMAGING — DX PORTABLE PELVIS 1-2 VIEWS
1 series · 1 of 1 positions shown · non-contrast
Comparison: [DATE]

CLINICAL DATA: Pain status post fall

EXAM:
PORTABLE PELVIS 1-2 VIEWS

[pelvis ap]
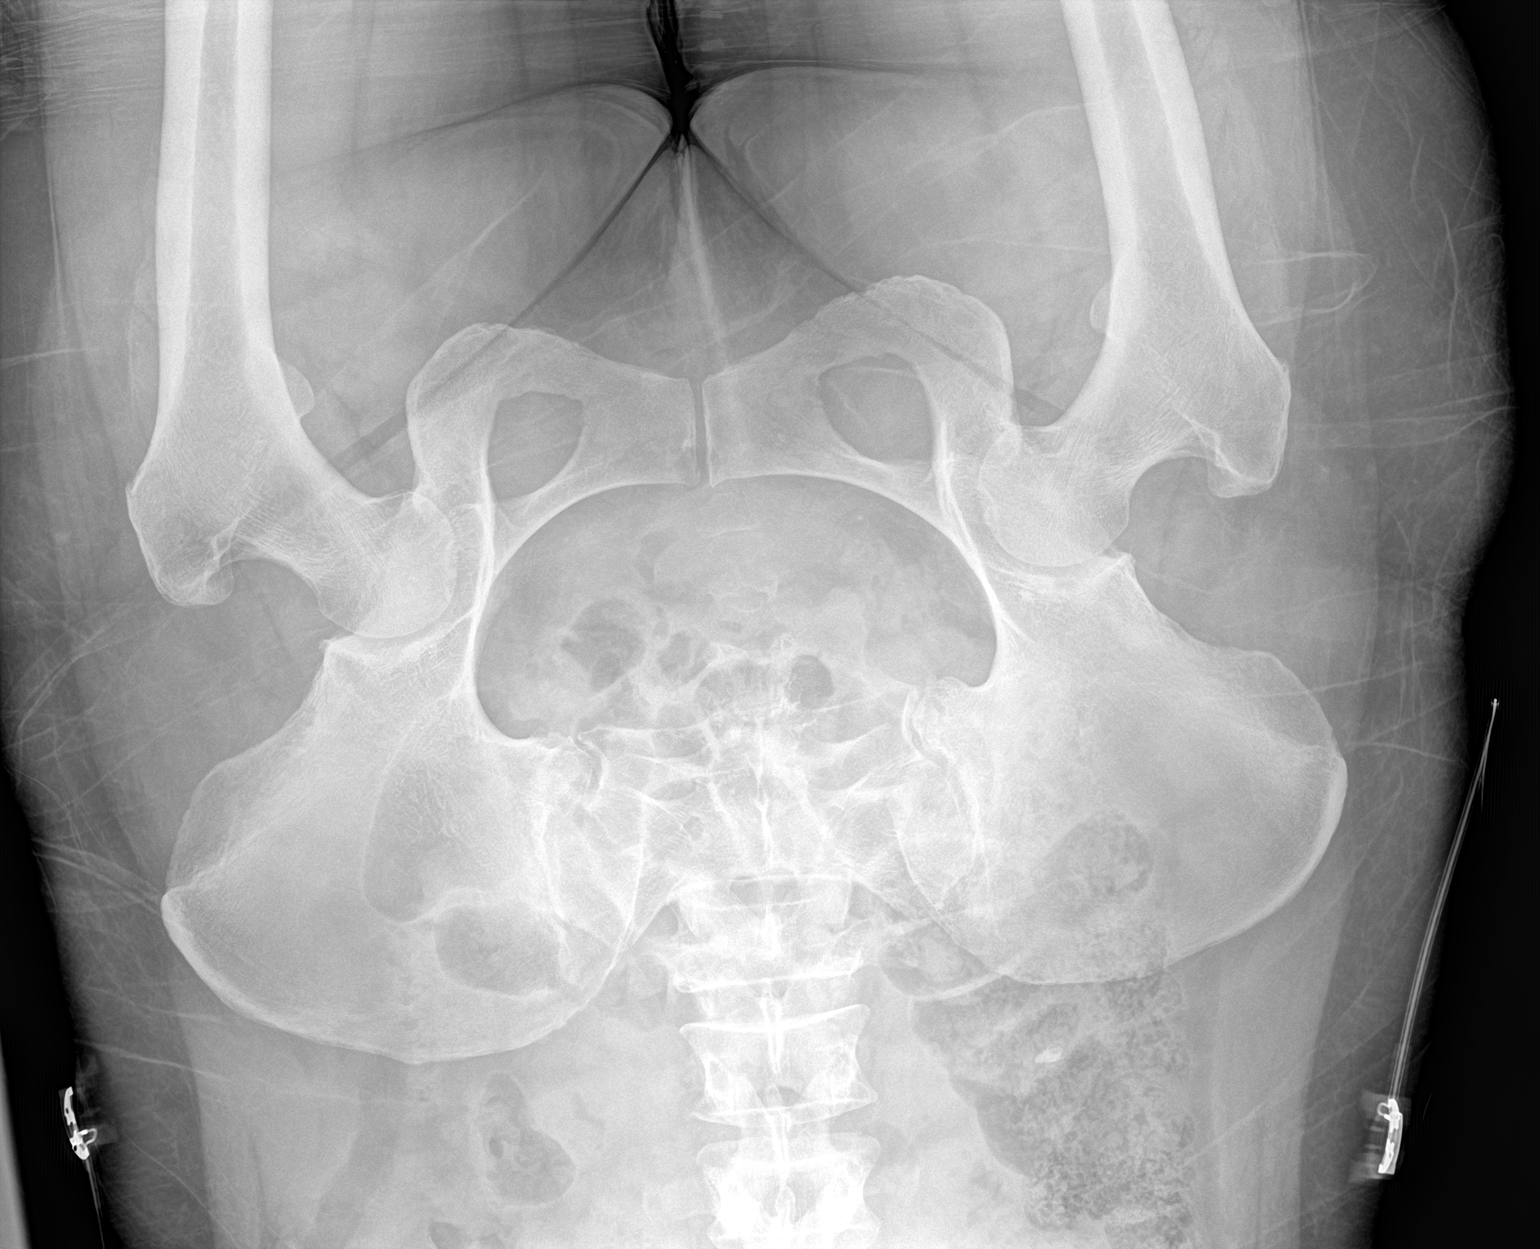

[1 of 1 positions shown; findings below may reference images not displayed]

FINDINGS: There is no evidence of pelvic fracture or diastasis. No pelvic bone
lesions are seen.
IMPRESSION: Negative.

## 2018-10-13 IMAGING — DX PORTABLE CHEST - 1 VIEW
1 series · 1 of 1 positions shown · non-contrast
Comparison: [DATE]

CLINICAL DATA: Pain status post fall

EXAM:
PORTABLE CHEST 1 VIEW

[chest ap]
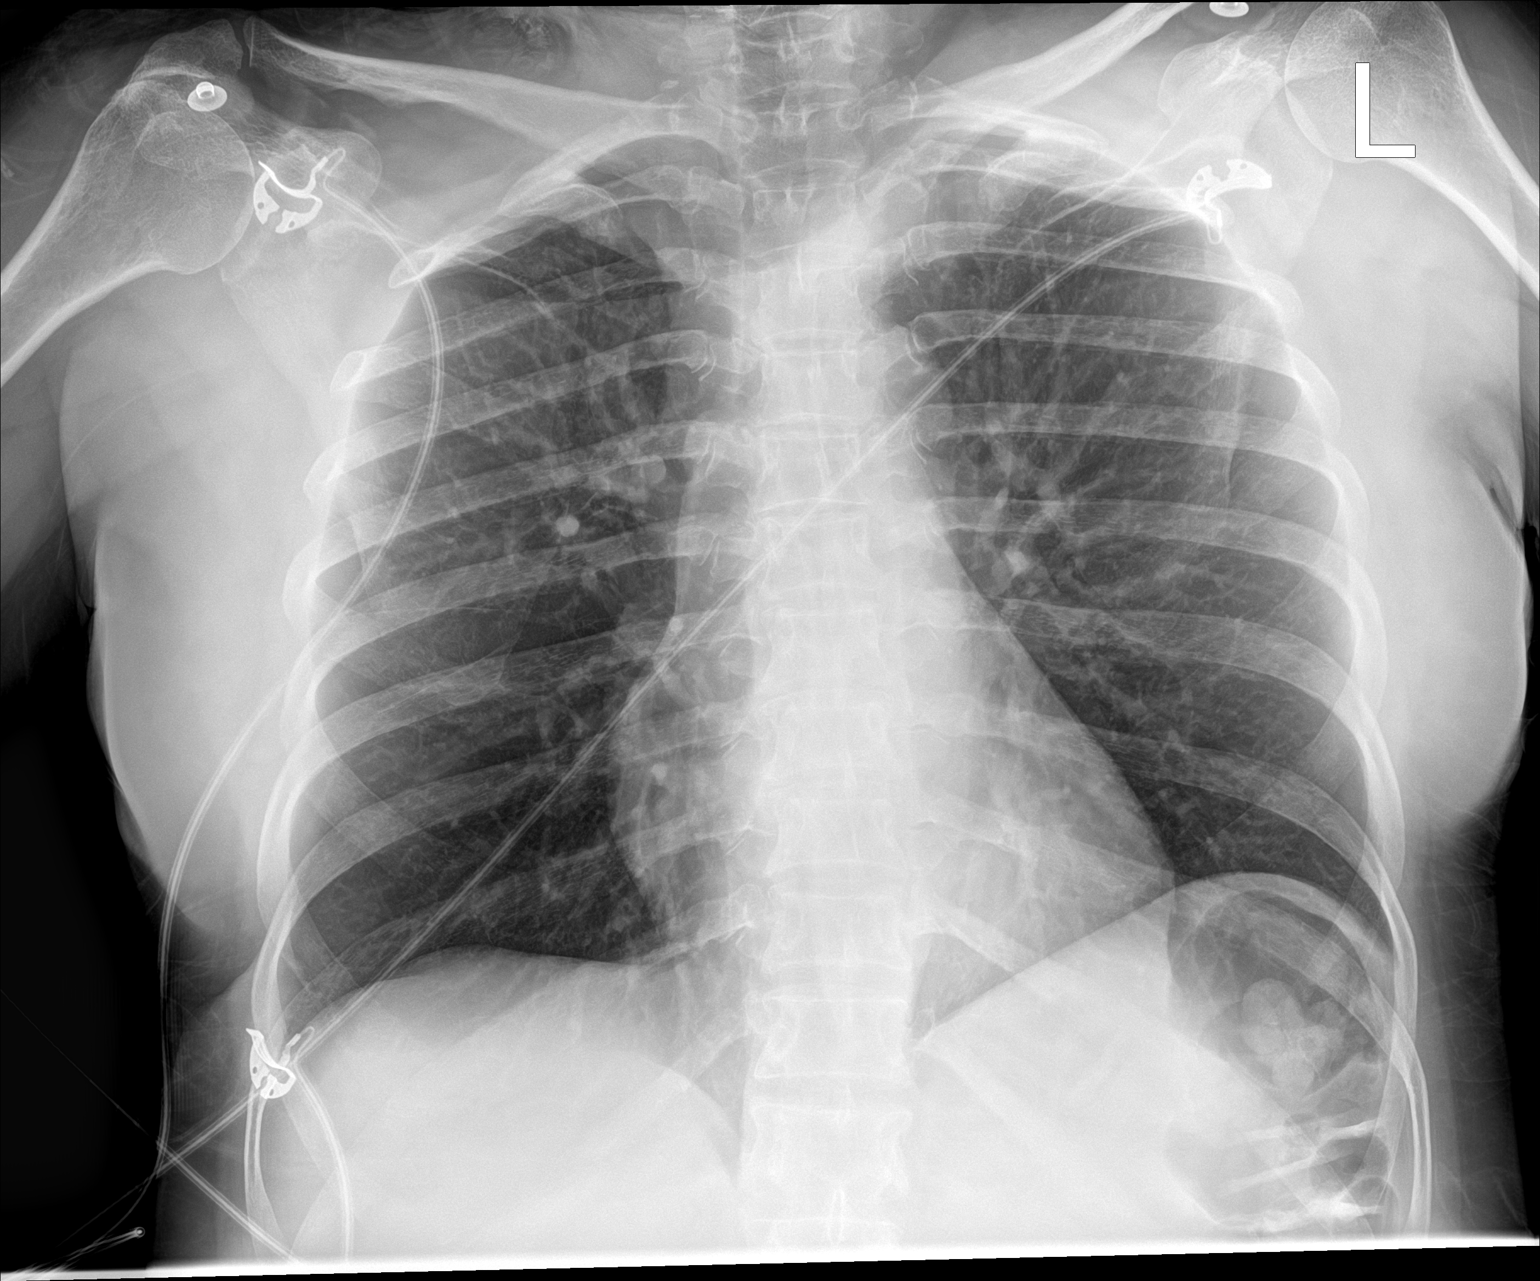

[1 of 1 positions shown; findings below may reference images not displayed]

FINDINGS: The heart size and mediastinal contours are within normal limits.
Both lungs are clear. The visualized skeletal structures are
unremarkable.
IMPRESSION: No active disease.

## 2018-10-13 IMAGING — CT CT ABDOMEN AND PELVIS WITH CONTRAST
2 of 5 series · 14 of 46 positions shown, 16 images · IV contrast (omnipaque)
Comparison: None.

CLINICAL DATA: Fall from 1 Mater

EXAM:
CT CHEST, ABDOMEN, AND PELVIS WITH CONTRAST
TECHNIQUE: Multidetector CT imaging of the chest, abdomen and pelvis was
performed following the standard protocol during bolus
administration of intravenous contrast.
CONTRAST:  100mL OMNIPAQUE IOHEXOL 300 MG/ML  SOLN

[Series 3: cap with · axial · 0.78mm/px · z∈[-385,+115]mm · 11 of 122 slices shown, 13 images]
[im 11/122  soft-tissue]
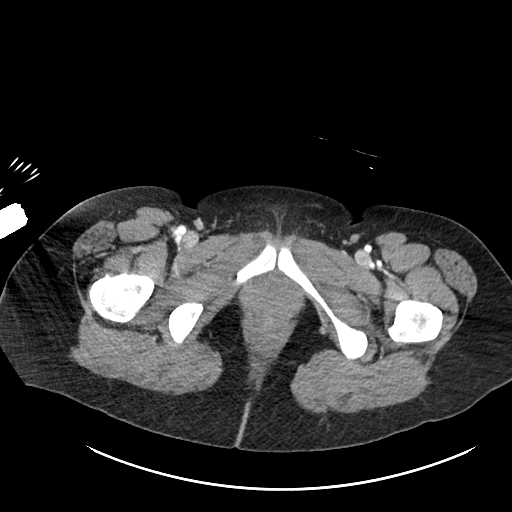
[im 11/122  bone]
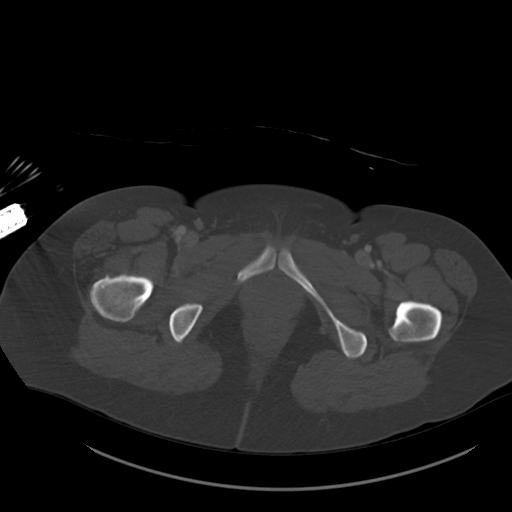
[im 21/122  soft-tissue]
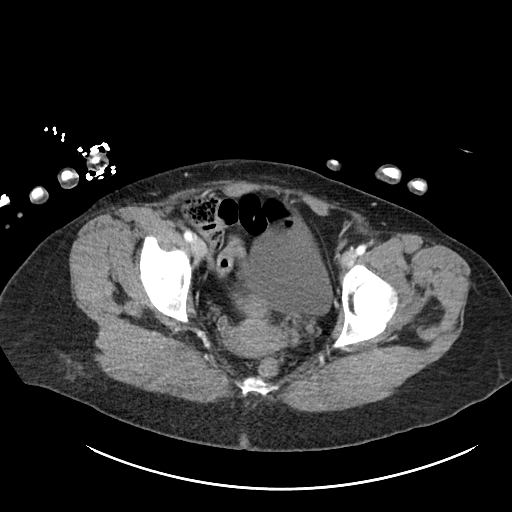
[im 31/122  soft-tissue]
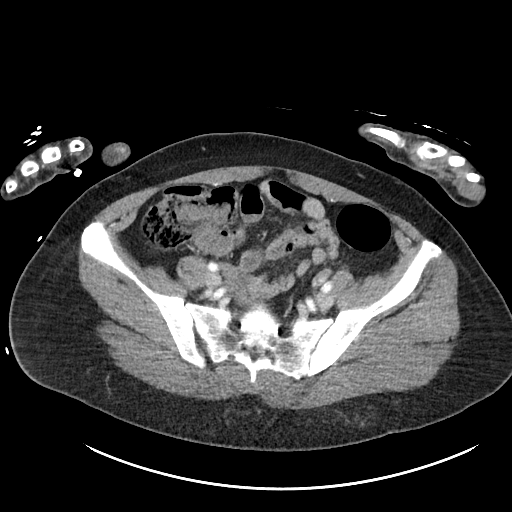
[im 41/122  soft-tissue]
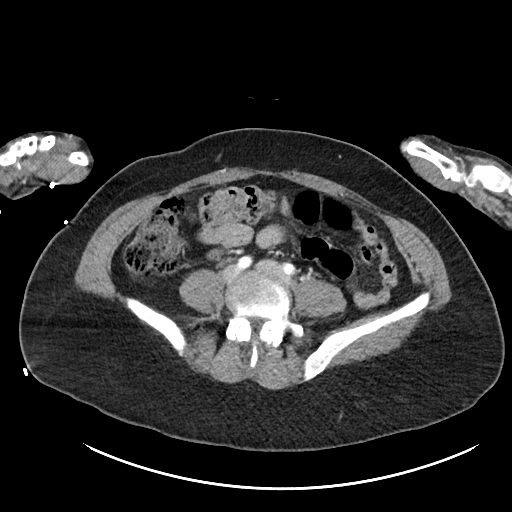
[im 51/122  soft-tissue]
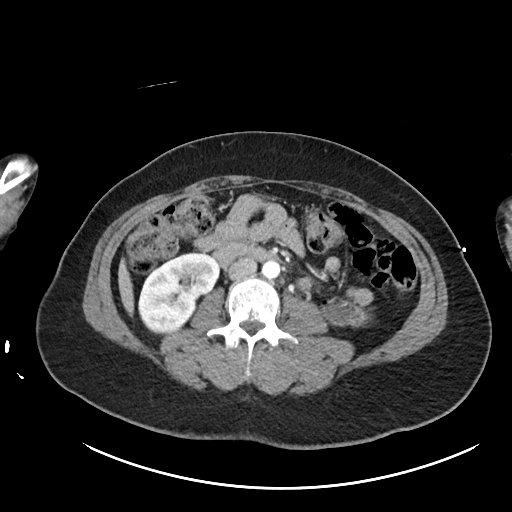
[im 61/122  soft-tissue]
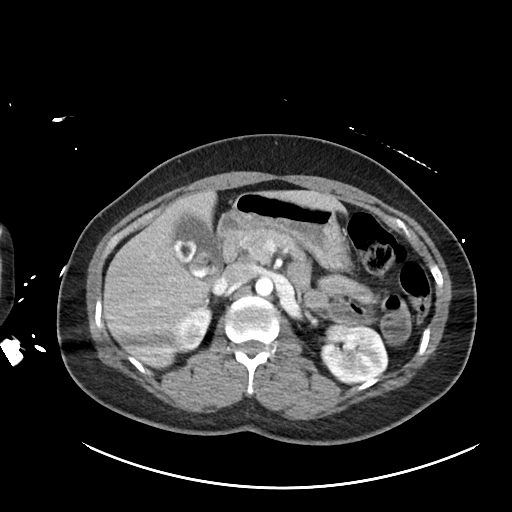
[im 71/122  soft-tissue]
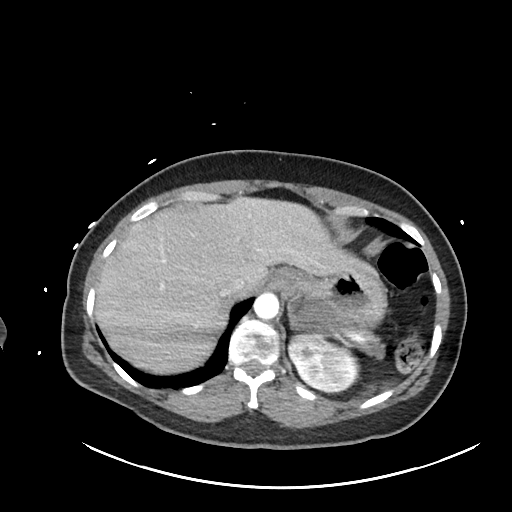
[im 81/122  soft-tissue]
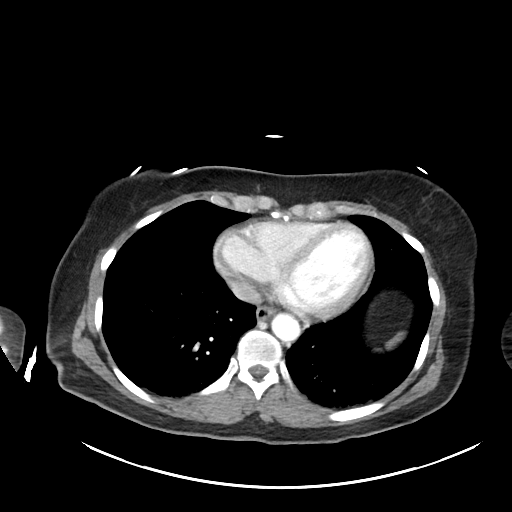
[im 91/122  soft-tissue]
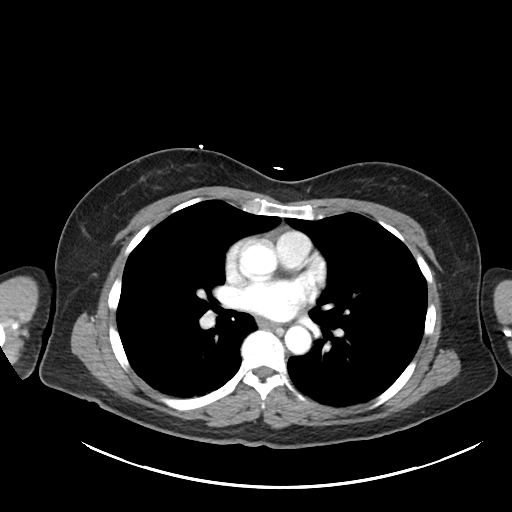
[im 91/122  bone]
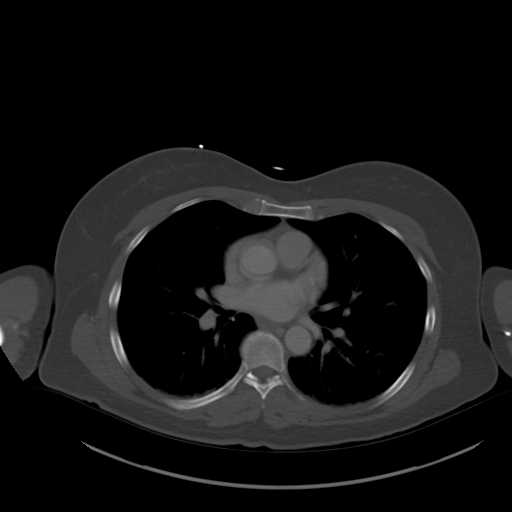
[im 101/122  soft-tissue]
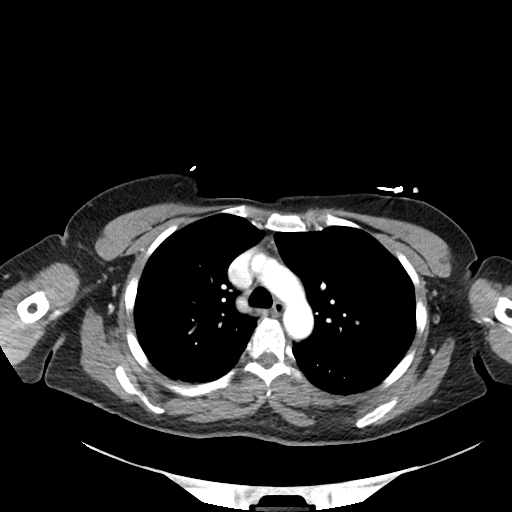
[im 111/122  soft-tissue]
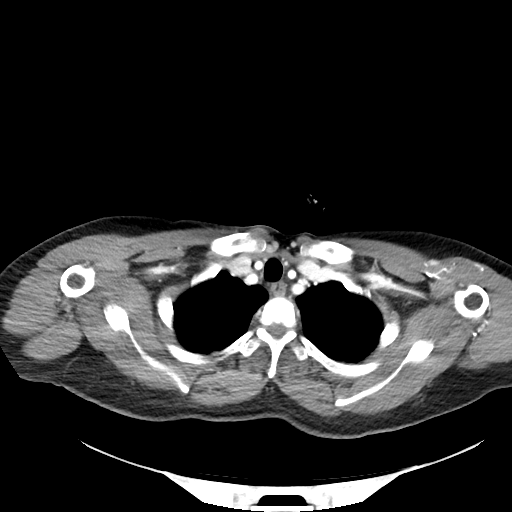

[Series 6: cor · coronal · 0.87mm/px · 3 of 84 slices shown]
[im 28/84  soft-tissue]
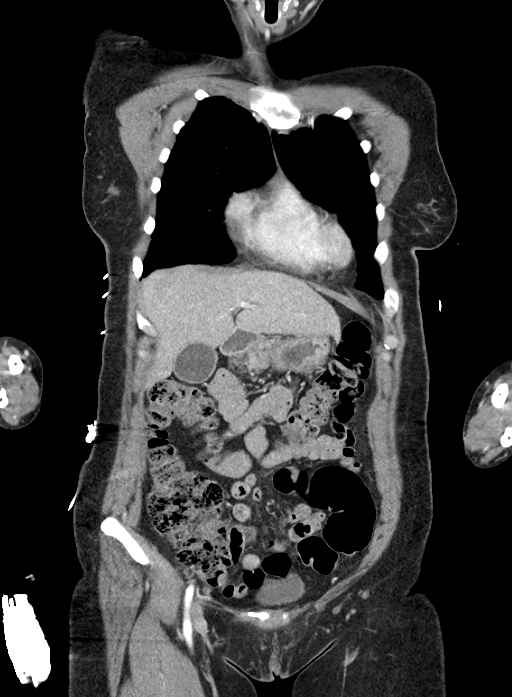
[im 37/84  soft-tissue]
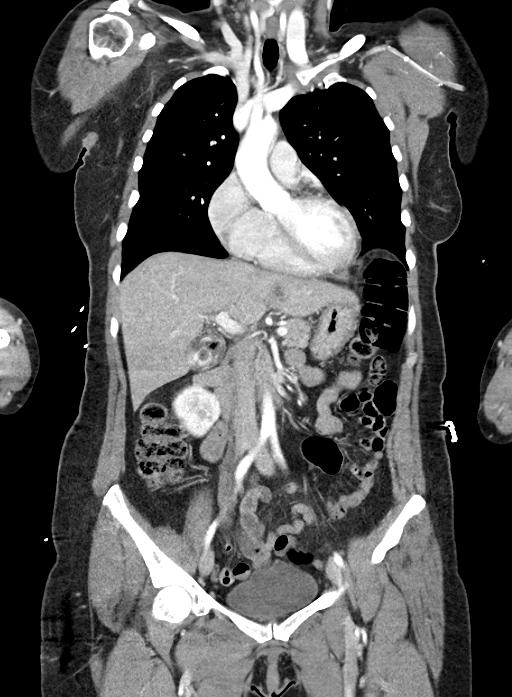
[im 47/84  soft-tissue]
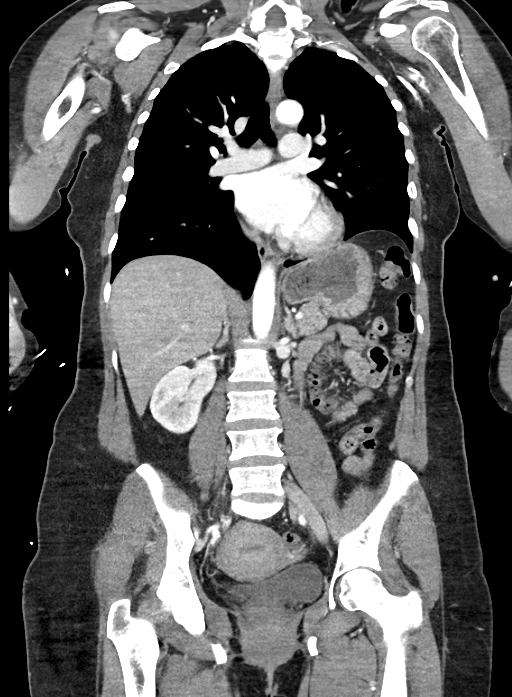

[14 of 46 positions shown; findings below may reference images not displayed]

FINDINGS: CT CHEST FINDINGS

Cardiovascular: Heart size is normal without pericardial effusion.
The thoracic aorta is normal in course and caliber without
dissection, aneurysm, ulceration or intramural hematoma.

Mediastinum/Nodes: No mediastinal hematoma. No mediastinal, hilar or
axillary lymphadenopathy. The visualized thyroid and thoracic
esophageal course are unremarkable.

Lungs/Pleura: No pulmonary contusion, pneumothorax or pleural
effusion. The central airways are clear.

Musculoskeletal: There is an acute incomplete burst fracture of T12
involving the anterior and posterior walls and superior endplate
with approximately 25% height loss. 4 mm of retropulsion.

CT ABDOMEN PELVIS FINDINGS

Hepatobiliary: No hepatic hematoma or laceration. No biliary
dilatation. Cholelithiasis without acute inflammation.

Pancreas: Normal contours without ductal dilatation. No
peripancreatic fluid collection.

Spleen: No splenic laceration or hematoma.

Adrenals/Urinary Tract:

--Adrenal glands: No adrenal hemorrhage.

--Right kidney/ureter: No hydronephrosis or perinephric hematoma.

--Left kidney/ureter: No hydronephrosis or perinephric hematoma.

--Urinary bladder: Unremarkable.

Stomach/Bowel:

--Stomach/Duodenum: No hiatal hernia or other gastric abnormality.
Normal duodenal course and caliber.

--Small bowel: No dilatation or inflammation.

--Colon: No focal abnormality.

--Appendix: Normal.

Vascular/Lymphatic: Normal course and caliber of the major abdominal
vessels. No abdominal or pelvic lymphadenopathy.

Reproductive: Normal uterus and ovaries.

Musculoskeletal. No pelvic fractures.

Other: None.
IMPRESSION: 1. Incomplete burst (AOSPINE Class A3) fracture of T12 involving the
anterior and posterior walls and superior endplate. 25% height loss
and 4 mm retropulsion.
2. No other acute abnormality of the chest, abdomen or pelvis.

## 2018-10-13 IMAGING — CT CT HEAD WITHOUT CONTRAST
5 of 8 series · 18 of 47 positions shown, 19 images · non-contrast
Comparison: CT head and C-spine [DATE]

CLINICAL DATA: Fall from ladder

EXAM:
CT HEAD WITHOUT CONTRAST
CT CERVICAL SPINE WITHOUT CONTRAST
TECHNIQUE: Multidetector CT imaging of the head and cervical spine was
performed following the standard protocol without intravenous
contrast. Multiplanar CT image reconstructions of the cervical spine
were also generated.

[Series 4: head wo · axial · 0.43mm/px · z∈[+270,+330]mm · 2 of 37 slices shown, 3 images]
[im 13/37  brain]
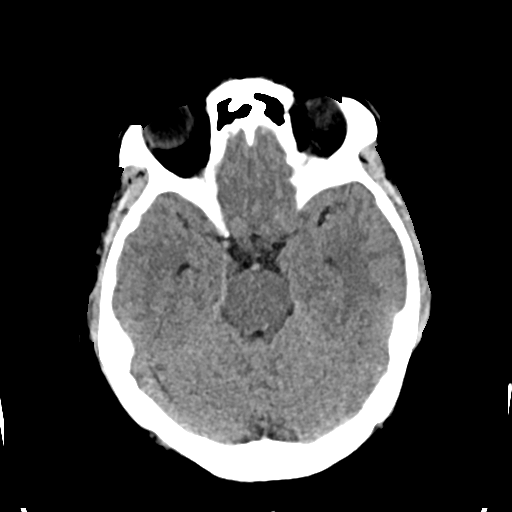
[im 13/37  bone]
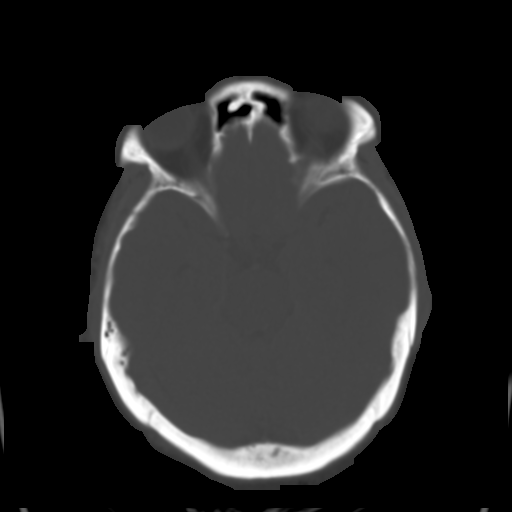
[im 25/37  brain]
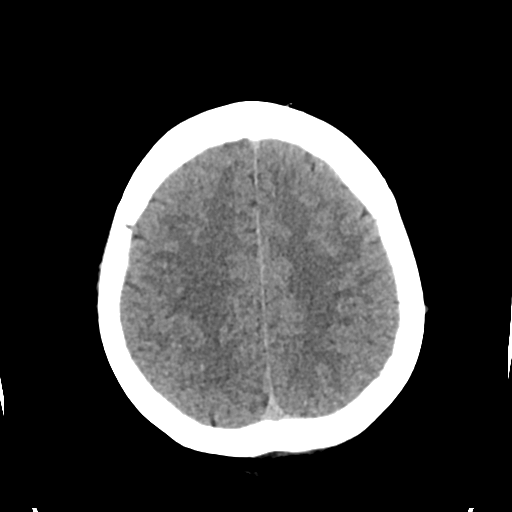

[Series 5: head bone · axial · 0.43mm/px · z∈[+232,+322]mm · 5 of 92 slices shown]
[im 12/92  bone]
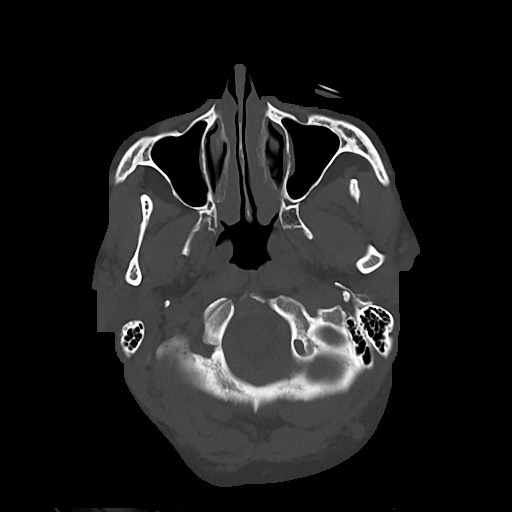
[im 23/92  bone]
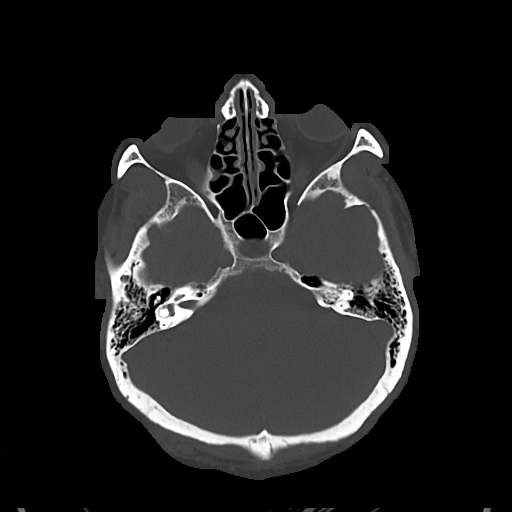
[im 35/92  bone]
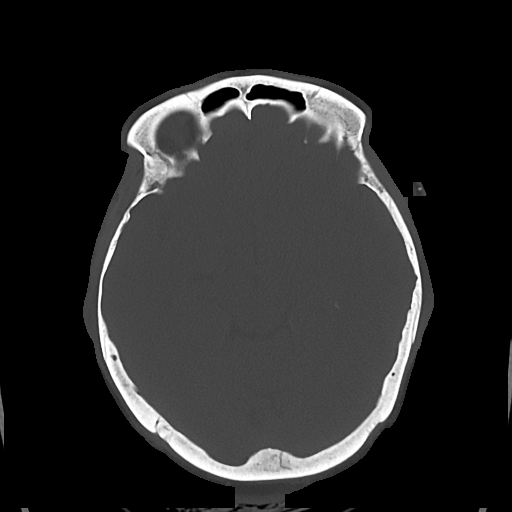
[im 46/92  bone]
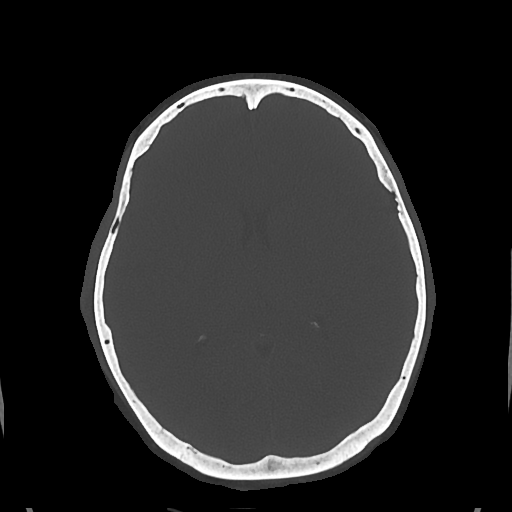
[im 57/92  bone]
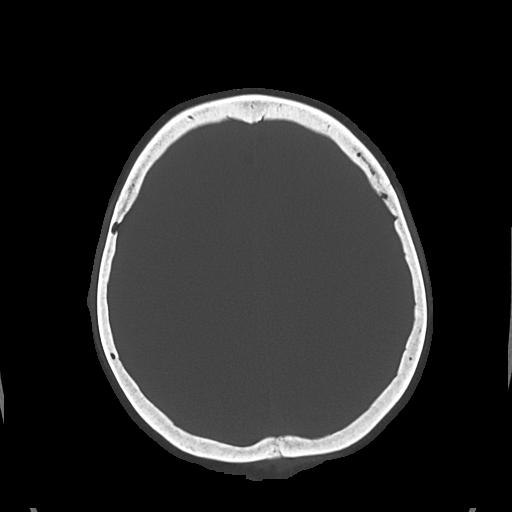

[Series 6: cor soft · coronal · 0.36mm/px · 3 of 70 slices shown]
[im 20/70  brain]
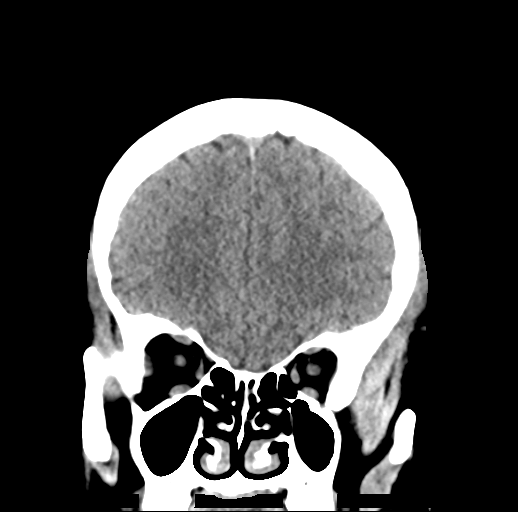
[im 30/70  brain]
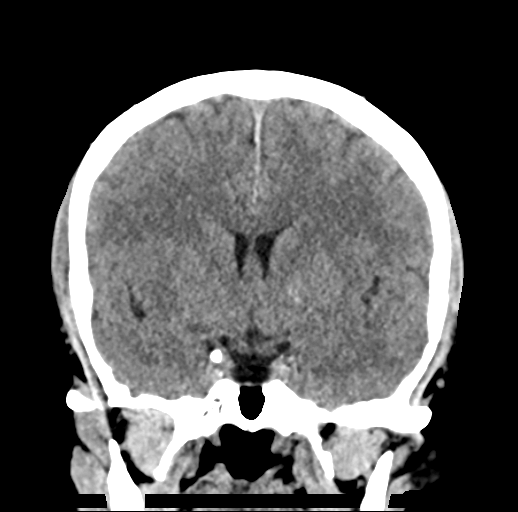
[im 40/70  brain]
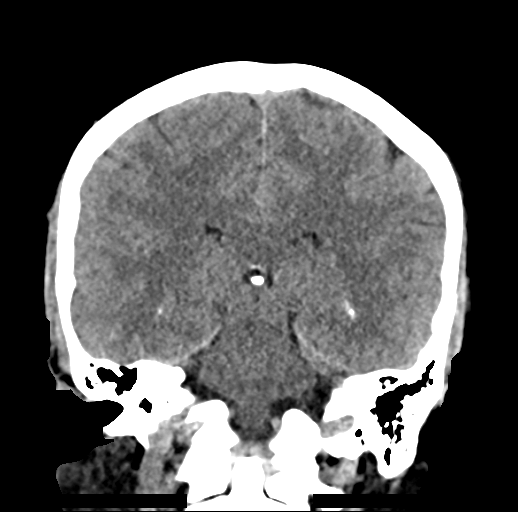

[Series 7: sag soft · sagittal · 0.36mm/px · 1 of 58 slices shown]
[im 29/58  brain]
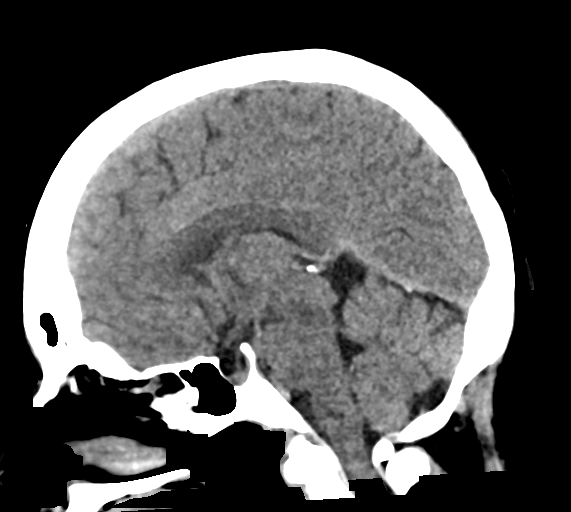

[Series 12: orthogonal axials · axial · 0.21mm/px · z∈[+112,+213]mm · 7 of 83 slices shown]
[im 11/83  brain]
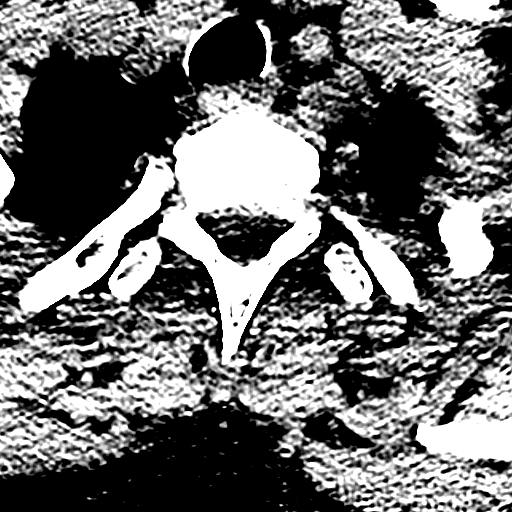
[im 21/83  brain]
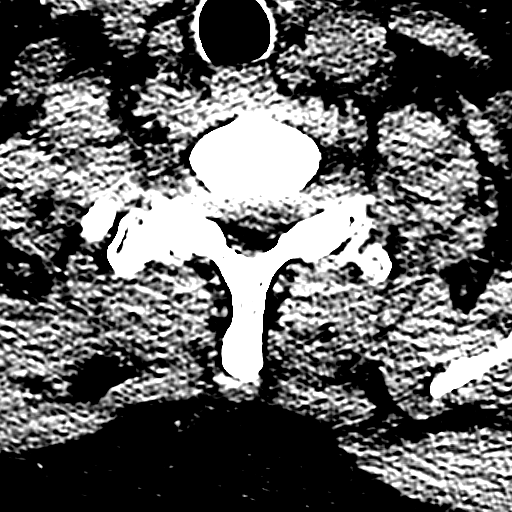
[im 31/83  brain]
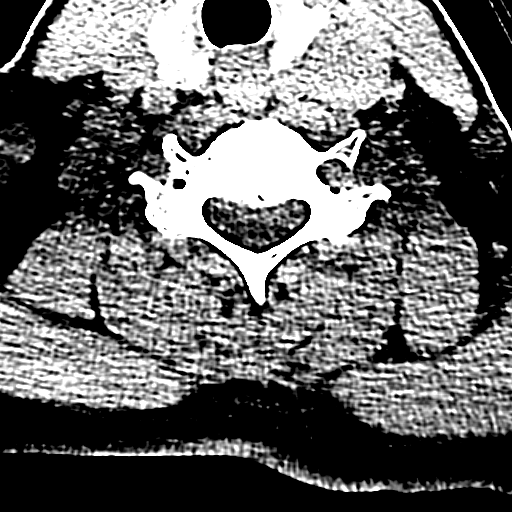
[im 42/83  brain]
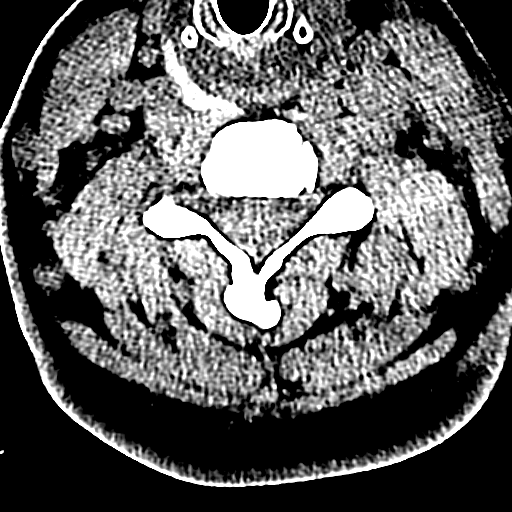
[im 52/83  brain]
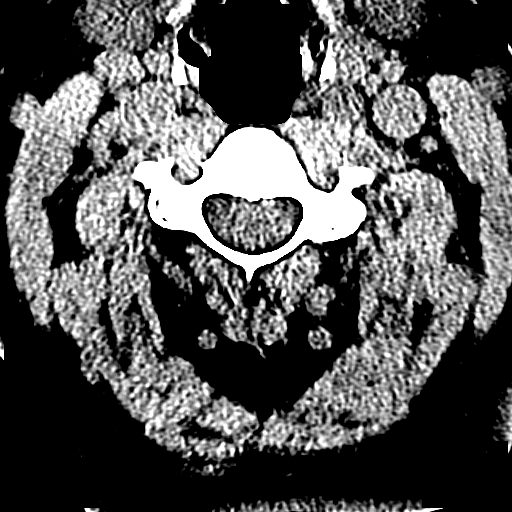
[im 62/83  brain]
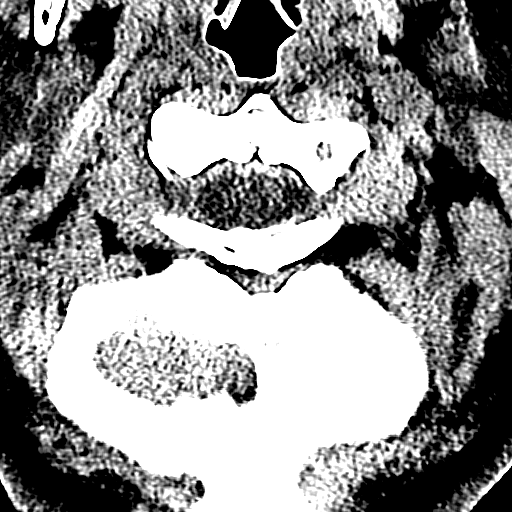
[im 72/83  brain]
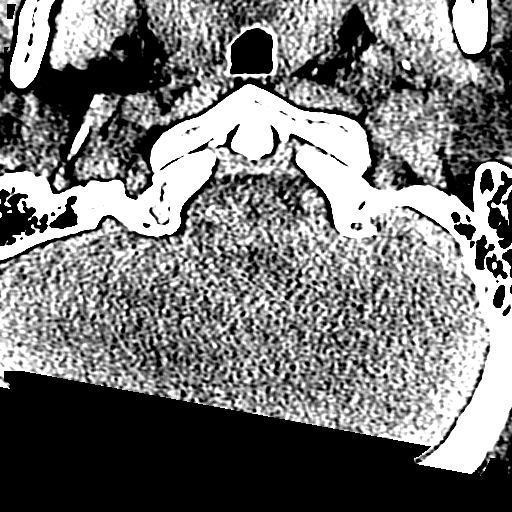

[18 of 47 positions shown; findings below may reference images not displayed]

FINDINGS: CT HEAD FINDINGS

Brain: There is no mass, hemorrhage or extra-axial collection. The
size and configuration of the ventricles and extra-axial CSF spaces
are normal. The brain parenchyma is normal, without evidence of
acute or chronic infarction.

Vascular: No abnormal hyperdensity of the major intracranial
arteries or dural venous sinuses. No intracranial atherosclerosis.

Skull: The visualized skull base, calvarium and extracranial soft
tissues are normal.

Sinuses/Orbits: No fluid levels or advanced mucosal thickening of
the visualized paranasal sinuses. No mastoid or middle ear effusion.
The orbits are normal.

CT CERVICAL SPINE FINDINGS

Alignment: No static subluxation. Facets are aligned. Occipital
condyles are normally positioned.

Skull base and vertebrae: No acute fracture.

Soft tissues and spinal canal: No prevertebral fluid or swelling. No
visible canal hematoma.

Disc levels: No advanced spinal canal or neural foraminal stenosis.

Upper chest: No pneumothorax, pulmonary nodule or pleural effusion.

Other: Normal visualized paraspinal cervical soft tissues.
IMPRESSION: Normal CT of the head and cervical spine.

## 2018-10-13 MED ORDER — ONDANSETRON HCL 4 MG/2ML IJ SOLN
4.0000 mg | Freq: Once | INTRAMUSCULAR | Status: AC
  Administered 2018-10-13: 4 mg via INTRAVENOUS
  Filled 2018-10-13: qty 2

## 2018-10-13 MED ORDER — HYDROMORPHONE HCL 1 MG/ML IJ SOLN
1.0000 mg | Freq: Once | INTRAMUSCULAR | Status: AC
  Administered 2018-10-13: 1 mg via INTRAVENOUS
  Filled 2018-10-13: qty 1

## 2018-10-13 MED ORDER — IOHEXOL 300 MG/ML  SOLN
100.0000 mL | Freq: Once | INTRAMUSCULAR | Status: AC | PRN
  Administered 2018-10-13: 100 mL via INTRAVENOUS

## 2018-10-13 NOTE — ED Triage Notes (Signed)
Patient was on a ladder tonight and fell from top.  It was about a 10 foot ladder and landed on concrete/ground.  She is having bilateral hip, lower back and head pain.  She stated to EMS she does not remember the incident.

## 2018-10-13 NOTE — ED Provider Notes (Signed)
[facility] EMERGENCY DEPARTMENT Provider Note   CSN: [id number] Arrival date & time: 10/13/18  2205    History   Chief Complaint Chief Complaint  Patient presents with   Fall    Level 5 caveat secondary to acuity of condition  HPI Marissa Garcia is a 53 y.o. female.     53 year old female with no significant past medical history presents to the emergency department for evaluation of a fall.  Patient fell 10 feet from a ladder this evening onto concrete, though EMS later reports the patient was found on a mixture of rock and grass.  She is amnestic to the event, complaining of bilateral hip pain, low back pain, headache.  Also noting some discomfort with breathing.  She is not on chronic anticoagulation.  Not given any medications in route.  Denies shortness of breath, abdominal pain, extremity numbness or paresthesias.  The history is provided by the patient. No language interpreter was used.  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 Authorizing Provider  estradiol (ESTRACE VAGINAL) 0.1 MG/GM vaginal cream Place 0.25 Applicatorfuls vaginally 3 (three) times a week. Patient not  taking: Reported on 07/24/2017 04/24/16   Dessa Phi, MD  HYDROcodone-acetaminophen (NORCO/VICODIN) 5-325 MG tablet Take 1 tablet by mouth every 4 (four) hours as needed for severe pain. 10/14/18   Antony Madura, PA-C  Melatonin 3 MG TABS Take 1.5 mg by mouth at bedtime as needed (for sleep).    [provider]  methocarbamol (ROBAXIN) 500 MG tablet Take 1 tablet (500 mg total) by mouth every 12 (twelve) hours as needed for muscle spasms. 10/14/18   Antony Madura, PA-C  naproxen (NAPROSYN) 500 MG tablet Take 1 tablet (500 mg total) by mouth 2 (two) times daily. 10/14/18   Antony Madura, PA-C  zolpidem (AMBIEN) 5 MG tablet Take 1 tablet (5 mg total) by mouth at bedtime as needed for sleep. Patient not taking: Reported on 07/24/2017 03/19/16   Dessa Phi, MD    Family History Family History  Problem Relation Age of Onset   Hypertension Father    Hyperlipidemia Father     Social History Social History   Tobacco Use   Smoking status: Never Smoker   Smokeless tobacco: Never Used  Substance Use Topics   Alcohol use: No   Drug use: No     Allergies  Patient has no known allergies.   Review of Systems Review of Systems  Unable to perform ROS: Acuity of condition    Physical Exam Updated Vital Signs BP 121/69    Pulse 80    Temp 97.9 F (36.6 C) (Oral)    Resp 16    Ht 5' (1.524 m)    Wt 64 kg    LMP 03/19/2016    SpO2 94%    BMI 27.54 kg/m   Physical Exam Vitals signs and nursing note reviewed.  Constitutional:      General: She is not in acute distress.    Appearance: She is well-developed. She is not diaphoretic.     Comments: Nontoxic appearing. Crying out in discomfort.  HENT:     Head: Normocephalic and atraumatic.     Left Ear: Tympanic membrane, ear canal and external ear normal.     Ears:     Comments: Unable to visualize right TM. No hemotympanum on the left.    Mouth/Throat:     Mouth: Mucous membranes are moist.     Comments: Symmetric rise  of the uvula with phonation. Eyes:     General: No scleral icterus.    Conjunctiva/sclera: Conjunctivae normal.     Pupils: Pupils are equal, round, and reactive to light.     Comments: Full EOMs  Neck:     Comments: C-collar in place Cardiovascular:     Rate and Rhythm: Normal rate and regular rhythm.     Pulses: Normal pulses.  Pulmonary:     Effort: Pulmonary effort is normal. No respiratory distress.     Breath sounds: No stridor.     Comments: Chest expansion symmetric. No hypoxia. Abdominal:     Comments: Abdomen soft, nondistended, nontender.  Musculoskeletal:     Comments: TTP to bilateral hips  Skin:    General: Skin is warm and dry.     Coloration: Skin is not pale.     Findings: No erythema or rash.     Comments: No ecchymosis or contusions to chest or abdomen.  Neurological:     Mental Status: She is alert and oriented to person, place, and time.     Comments: GCS 15. Speech is goal oriented. No cranial nerve deficits appreciated; symmetric eyebrow raise, no facial drooping, tongue midline. Patient has equal grip strength bilaterally. Sensation to light touch intact. Patient moving all extremities.  Psychiatric:        Behavior: Behavior normal.      ED Treatments / Results  Labs (all labs ordered are listed, but only abnormal results are displayed) Labs Reviewed  CBC WITH DIFFERENTIAL/PLATELET - Abnormal; Notable for the following components:      Result Value   WBC 11.0 (*)    Abs Immature Granulocytes 0.09 (*)    All other components within normal limits  COMPREHENSIVE METABOLIC PANEL - Abnormal; Notable for the following components:   Glucose, Bld 117 (*)    All other components within normal limits  PROTIME-INR  LACTIC ACID, PLASMA  I-STAT BETA HCG BLOOD, ED ([facility], [facility], [facility] ONLY)  I-STAT CREATININE, ED    EKG None  Radiology Ct Head Wo Contrast  Result Date: 10/14/2018 CLINICAL DATA:  Fall from ladder EXAM: CT HEAD WITHOUT CONTRAST CT CERVICAL  SPINE WITHOUT CONTRAST TECHNIQUE: Multidetector CT imaging of the head and cervical spine was performed following the standard protocol without intravenous contrast. Multiplanar CT image reconstructions of the cervical spine were also generated. COMPARISON:  CT head  and C-spine 10/27/2014 FINDINGS: CT HEAD FINDINGS Brain: There is no mass, hemorrhage or extra-axial collection. The size and configuration of the ventricles and extra-axial CSF spaces are normal. The brain parenchyma is normal, without evidence of acute or chronic infarction. Vascular: No abnormal hyperdensity of the major intracranial arteries or dural venous sinuses. No intracranial atherosclerosis. Skull: The visualized skull base, calvarium and extracranial soft tissues are normal. Sinuses/Orbits: No fluid levels or advanced mucosal thickening of the visualized paranasal sinuses. No mastoid or middle ear effusion. The orbits are normal. CT CERVICAL SPINE FINDINGS Alignment: No static subluxation. Facets are aligned. Occipital condyles are normally positioned. Skull base and vertebrae: No acute fracture. Soft tissues and spinal canal: No prevertebral fluid or swelling. No visible canal hematoma. Disc levels: No advanced spinal canal or neural foraminal stenosis. Upper chest: No pneumothorax, pulmonary nodule or pleural effusion. Other: Normal visualized paraspinal cervical soft tissues. IMPRESSION: Normal CT of the head and cervical spine. Electronically Signed   By: Deatra Robinson M.D.   On: 10/14/2018 01:07   Ct Chest W Contrast  Result Date: 10/14/2018 CLINICAL DATA:  Fall from 1 Mater EXAM: CT CHEST, ABDOMEN, AND PELVIS WITH CONTRAST TECHNIQUE: Multidetector CT imaging of the chest, abdomen and pelvis was performed following the standard protocol during bolus administration of intravenous contrast. CONTRAST:  OMNIPAQUE IOHEXOL 300 MG/ML  SOLN COMPARISON:  None. FINDINGS: CT CHEST FINDINGS Cardiovascular: Heart size is normal without  pericardial effusion. The thoracic aorta is normal in course and caliber without dissection, aneurysm, ulceration or intramural hematoma. Mediastinum/Nodes: No mediastinal hematoma. No mediastinal, hilar or axillary lymphadenopathy. The visualized thyroid and thoracic esophageal course are unremarkable. Lungs/Pleura: No pulmonary contusion, pneumothorax or pleural effusion. The central airways are clear. Musculoskeletal: There is an acute incomplete burst fracture of T12 involving the anterior and posterior walls and superior endplate with approximately 25% height loss. 4 mm of retropulsion. CT ABDOMEN PELVIS FINDINGS Hepatobiliary: No hepatic hematoma or laceration. No biliary dilatation. Cholelithiasis without acute inflammation. Pancreas: Normal contours without ductal dilatation. No peripancreatic fluid collection. Spleen: No splenic laceration or hematoma. Adrenals/Urinary Tract: --Adrenal glands: No adrenal hemorrhage. --Right kidney/ureter: No hydronephrosis or perinephric hematoma. --Left kidney/ureter: No hydronephrosis or perinephric hematoma. --Urinary bladder: Unremarkable. Stomach/Bowel: --Stomach/Duodenum: No hiatal hernia or other gastric abnormality. Normal duodenal course and caliber. --Small bowel: No dilatation or inflammation. --Colon: No focal abnormality. --Appendix: Normal. Vascular/Lymphatic: Normal course and caliber of the major abdominal vessels. No abdominal or pelvic lymphadenopathy. Reproductive: Normal uterus and ovaries. Musculoskeletal. No pelvic fractures. Other: None. IMPRESSION: 1. Incomplete burst (AOSPINE Class A3) fracture of T12 involving the anterior and posterior walls and superior endplate. 25% height loss and 4 mm retropulsion. 2. No other acute abnormality of the chest, abdomen or pelvis. Electronically Signed   By: Deatra Robinson M.D.   On: 10/14/2018 01:14   Ct Cervical Spine Wo Contrast  Result Date: 10/14/2018 CLINICAL DATA:  Fall from ladder EXAM: CT HEAD WITHOUT  CONTRAST CT CERVICAL SPINE WITHOUT CONTRAST TECHNIQUE: Multidetector CT imaging of the head and cervical spine was performed following the standard protocol without intravenous contrast. Multiplanar CT image reconstructions of the cervical spine were also generated. COMPARISON:  CT head and C-spine 10/27/2014 FINDINGS: CT HEAD FINDINGS Brain: There is no mass, hemorrhage or extra-axial collection. The size and configuration of the ventricles and extra-axial CSF spaces are normal. The brain parenchyma is normal, without evidence of acute or chronic infarction. Vascular: No abnormal hyperdensity of the major intracranial arteries or dural  venous sinuses. No intracranial atherosclerosis. Skull: The visualized skull base, calvarium and extracranial soft tissues are normal. Sinuses/Orbits: No fluid levels or advanced mucosal thickening of the visualized paranasal sinuses. No mastoid or middle ear effusion. The orbits are normal. CT CERVICAL SPINE FINDINGS Alignment: No static subluxation. Facets are aligned. Occipital condyles are normally positioned. Skull base and vertebrae: No acute fracture. Soft tissues and spinal canal: No prevertebral fluid or swelling. No visible canal hematoma. Disc levels: No advanced spinal canal or neural foraminal stenosis. Upper chest: No pneumothorax, pulmonary nodule or pleural effusion. Other: Normal visualized paraspinal cervical soft tissues. IMPRESSION: Normal CT of the head and cervical spine. Electronically Signed   By: Deatra Robinson M.D.   On: 10/14/2018 01:07   Ct Abdomen Pelvis W Contrast  Result Date: 10/14/2018 CLINICAL DATA:  Fall from 1 Mater EXAM: CT CHEST, ABDOMEN, AND PELVIS WITH CONTRAST TECHNIQUE: Multidetector CT imaging of the chest, abdomen and pelvis was performed following the standard protocol during bolus administration of intravenous contrast. CONTRAST:  OMNIPAQUE IOHEXOL 300 MG/ML  SOLN COMPARISON:  None. FINDINGS: CT CHEST FINDINGS Cardiovascular:  Heart size is normal without pericardial effusion. The thoracic aorta is normal in course and caliber without dissection, aneurysm, ulceration or intramural hematoma. Mediastinum/Nodes: No mediastinal hematoma. No mediastinal, hilar or axillary lymphadenopathy. The visualized thyroid and thoracic esophageal course are unremarkable. Lungs/Pleura: No pulmonary contusion, pneumothorax or pleural effusion. The central airways are clear. Musculoskeletal: There is an acute incomplete burst fracture of T12 involving the anterior and posterior walls and superior endplate with approximately 25% height loss. 4 mm of retropulsion. CT ABDOMEN PELVIS FINDINGS Hepatobiliary: No hepatic hematoma or laceration. No biliary dilatation. Cholelithiasis without acute inflammation. Pancreas: Normal contours without ductal dilatation. No peripancreatic fluid collection. Spleen: No splenic laceration or hematoma. Adrenals/Urinary Tract: --Adrenal glands: No adrenal hemorrhage. --Right kidney/ureter: No hydronephrosis or perinephric hematoma. --Left kidney/ureter: No hydronephrosis or perinephric hematoma. --Urinary bladder: Unremarkable. Stomach/Bowel: --Stomach/Duodenum: No hiatal hernia or other gastric abnormality. Normal duodenal course and caliber. --Small bowel: No dilatation or inflammation. --Colon: No focal abnormality. --Appendix: Normal. Vascular/Lymphatic: Normal course and caliber of the major abdominal vessels. No abdominal or pelvic lymphadenopathy. Reproductive: Normal uterus and ovaries. Musculoskeletal. No pelvic fractures. Other: None. IMPRESSION: 1. Incomplete burst (AOSPINE Class A3) fracture of T12 involving the anterior and posterior walls and superior endplate. 25% height loss and 4 mm retropulsion. 2. No other acute abnormality of the chest, abdomen or pelvis. Electronically Signed   By: Deatra Robinson M.D.   On: 10/14/2018 01:14   Dg Pelvis Portable  Result Date: 10/14/2018 CLINICAL DATA:  Pain status post fall  EXAM: PORTABLE PELVIS 1-2 VIEWS COMPARISON:  10/27/2014 FINDINGS: There is no evidence of pelvic fracture or diastasis. No pelvic bone lesions are seen. IMPRESSION: Negative. Electronically Signed   By: Katherine Mantle M.D.   On: 10/14/2018 00:05   Dg Chest Port 1 View  Result Date: 10/14/2018 CLINICAL DATA:  Pain status post fall EXAM: PORTABLE CHEST 1 VIEW COMPARISON:  10/27/2014 FINDINGS: The heart size and mediastinal contours are within normal limits. Both lungs are clear. The visualized skeletal structures are unremarkable. IMPRESSION: No active disease. Electronically Signed   By: Katherine Mantle M.D.   On: 10/14/2018 00:04    Procedures .Critical Care Performed by: Antony Madura, PA-C Authorized by: Antony Madura, PA-C   Critical care provider statement:    Critical care time (minutes):  45   Critical care was necessary to treat or prevent imminent or  life-threatening deterioration of the following conditions:  Trauma   Critical care was time spent personally by me on the following activities:  Discussions with consultants, evaluation of patient's response to treatment, examination of patient, ordering and performing treatments and interventions, ordering and review of laboratory studies, ordering and review of radiographic studies, pulse oximetry, re-evaluation of patient's condition, obtaining history from patient or surrogate and review of old charts   (including critical care time)  Medications Ordered in ED Medications  HYDROmorphone (DILAUDID) injection 1 mg (1 mg Intravenous Given 10/13/18 2231)  ondansetron (ZOFRAN) injection 4 mg (4 mg Intravenous Given 10/13/18 2231)  iohexol (OMNIPAQUE) 300 MG/ML solution 100 mL (100 mLs Intravenous Contrast Given 10/13/18 2342)  ketorolac (TORADOL) 30 MG/ML injection 15 mg (15 mg Intravenous Given 10/14/18 0202)     Initial Impression / Assessment and Plan / ED Course  I have reviewed the triage vital signs and the nursing  notes.  Pertinent labs & imaging results that were available during my care of the patient were reviewed by me and considered in my medical decision making (see chart for details).        45:85 PM 52 year old female presents to the emergency department for evaluation after she fell approximately 10 feet from a ladder.  She is amnestic to the event, complaining of headache, low back and hip pain.  No bowel or bladder incontinence.  Denies numbness or paresthesias in extremities.  Exam is generally reassuring, though plan for pan scan based on mechanism.  Cervical collar applied in the field by EMS.  Patient not on chronic anticoagulation.  11:27 PM Patient tested NEGATIVE for COVID yesterday at [facility]. Will d/c COVID test today.  1:28 AM Imaging positive for incomplete burst fracture of T12 involving the anterior and posterior walls as well as the superior endplate.  There is 25% height loss and 4 mm retropulsion.  Patient ordered to receive TLSO brace.  Will consult with neurosurgery to ensure safety with outpatient follow-up.  1:45 AM Case discussed with neurosurgery Doran Durand) who states that patient is stable for office f/u with Dr. Jordan Likes. Patient should call the office in the morning to schedule an appointment. In the interim should wear TLSO at all times, including while bathing.  4:00 AM Patient able to ambulate to the bathroom with minimal assistance following application of TLSO.  She remains neurovascularly intact and comfortable.  Will proceed with discharge and referral to neurosurgery.  Patient given short course of Robaxin and Norco for pain control in addition to the use of daily naproxen.  Return precautions discussed and provided. Patient discharged in stable condition with no unaddressed concerns.   Final Clinical Impressions(s) / ED Diagnoses   Final diagnoses:  Burst fracture of T12 vertebra (HCC)  Fall, initial encounter    ED Discharge Orders         Ordered     HYDROcodone-acetaminophen (NORCO/VICODIN) 5-325 MG tablet  Every 4 hours PRN     10/14/18 0359    methocarbamol (ROBAXIN) 500 MG tablet  Every 12 hours PRN     10/14/18 0359    naproxen (NAPROSYN) 500 MG tablet  2 times daily     10/14/18 0359           Antony Madura, PA-C 10/14/18 0412    Nira Conn, MD 10/14/18 [phone]

## 2018-10-14 MED ORDER — KETOROLAC TROMETHAMINE 30 MG/ML IJ SOLN
15.0000 mg | Freq: Once | INTRAMUSCULAR | Status: AC
  Administered 2018-10-14: 15 mg via INTRAVENOUS
  Filled 2018-10-14: qty 1

## 2018-10-14 MED ORDER — HYDROCODONE-ACETAMINOPHEN 5-325 MG PO TABS: 1.0000 | ORAL_TABLET | ORAL | 0 refills | Status: DC | PRN

## 2018-10-14 MED ORDER — NAPROXEN 500 MG PO TABS: 500.0000 mg | ORAL_TABLET | Freq: Two times a day (BID) | ORAL | 0 refills | Status: DC

## 2018-10-14 MED ORDER — METHOCARBAMOL 500 MG PO TABS: 500.0000 mg | ORAL_TABLET | Freq: Two times a day (BID) | ORAL | 0 refills | Status: DC | PRN

## 2018-10-14 NOTE — ED Notes (Signed)
Pt able to ambulate to the bathroom w/ standby assistance.  Provider notified.

## 2018-10-14 NOTE — ED Notes (Signed)
Called [employer] for brace, rep is coming in to apply

## 2018-10-14 NOTE — Discharge Instructions (Addendum)
Your imaging today showed that you have a broken bone in your spine.  You have been placed in a brace and need to keep this brace on at all times including when bathing or showering.  Call the office of Dr. Jordan Likes in the morning to schedule follow-up with neurosurgery.  You have been prescribed naproxen to take for management of pain.  You may also take Robaxin for muscle spasms and Norco for management of severe pain.  Do not drive or drink alcohol after taking Robaxin or Norco as this can make you drowsy and impair your judgment.  Follow-up with your primary care doctor as needed.  Return to the emergency department for any worsening symptoms, especially if you develop numbness of your genitals, loss of sensation in your legs, inability to walk, or if you urinate or defecate on your self without knowing that you need to use the bathroom.  ----  Su imagen de hoy mostr que tiene un hueso roto en la columna vertebral. Le han colocado un aparato ortopdico y Astronomer en todo momento, incluso cuando se baa o se ducha. Llame a la oficina del Dr. Jordan Likes por la maana para programar el seguimiento con neurociruga. Le han recetado naproxeno para Human resources officer. Tambin puede tomar Robaxin para espasmos musculares y Norco para Human resources officer intenso. No conduzca ni beba alcohol despus de tomar Robaxin o Norco, ya que esto puede causar somnolencia y Audiological scientist su juicio. Haga un seguimiento con su mdico de atencin primaria segn sea necesario. Regrese al departamento de emergencias por cualquier sntoma que empeore, especialmente si desarrolla entumecimiento de los genitales, prdida de la sensibilidad en las piernas, incapacidad para caminar, o si orina o defeca sin saber que necesita usar el bao.

## 2018-10-15 DIAGNOSIS — S22001A Stable burst fracture of unspecified thoracic vertebra, initial encounter for closed fracture: Secondary | ICD-10-CM | POA: Insufficient documentation

## 2019-02-22 ENCOUNTER — Encounter (HOSPITAL_COMMUNITY): Payer: Self-pay

## 2019-02-22 ENCOUNTER — Ambulatory Visit (HOSPITAL_COMMUNITY)
Admission: EM | Admit: 2019-02-22 | Discharge: 2019-02-22 | Disposition: A | Discharge status: Home / Self Care | Payer: Medicaid Other | Attending: Emergency Medicine | Admitting: Emergency Medicine

## 2019-02-22 ENCOUNTER — Other Ambulatory Visit: Payer: Self-pay

## 2019-02-22 DIAGNOSIS — R251 Tremor, unspecified: Secondary | ICD-10-CM

## 2019-02-22 MED ORDER — PROPRANOLOL HCL 20 MG PO TABS: 20.0000 mg | ORAL_TABLET | Freq: Three times a day (TID) | ORAL | 0 refills | Status: DC

## 2019-02-22 MED ORDER — CYCLOBENZAPRINE HCL 5 MG PO TABS: 5.0000 mg | ORAL_TABLET | Freq: Two times a day (BID) | ORAL | 0 refills | Status: DC | PRN

## 2019-02-22 MED ORDER — NAPROXEN 500 MG PO TABS: 500.0000 mg | ORAL_TABLET | Freq: Two times a day (BID) | ORAL | 0 refills | Status: DC

## 2019-02-22 NOTE — ED Provider Notes (Signed)
[facility]    CSN: [id number] Arrival date & time: 02/22/19  1623      History   Chief Complaint Chief Complaint  Patient presents with  . left side shakiness/numbness    HPI Marissa GreenhouseSara Garcia is a 53 y.o. female history of depression, anxiety, presenting today for evaluation of left-sided shaking.  Patient states that for the past year she has had shaking especially in her left arm.  States that initially symptoms were intermittent, but over the past year and of recently symptoms have worsened and become constant.  She has tremor at rest as well as if she tries to hold things.  Patient is right-handed.  She previously had plans to follow-up with neurology, but was unable to due to COVID.  She has also had her insurance switched during this time and has been unable to follow-up with her primary care.  She feels very embarrassed by this tremor.  She has previously tried propranolol 10 mg with minimal relief.  She has also been prescribed naproxen and Flexeril.  She has not been taking these of recently as she believes that they were expired.  She has developed muscle soreness throughout her arm as well as upper back secondary to constant shaking.  Last January with her PCP she had blood work done that included checking her thyroid which was normal, but she cannot recall other blood work that was performed.  HPI  Past Medical History:  Diagnosis Date  . Constipation   . Headache   . Hypercholesteremia     Patient Active Problem List   Diagnosis Date Noted  . 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 Perimenopausal 01/25/2016  . DENTAL CARIES 07/05/2010  . HEADACHE 03/21/2010  . ANXIETY STATE, UNSPECIFIED 02/27/2010  . ACID REFLUX DISEASE 02/27/2010  . WRIST PAIN, RIGHT 02/27/2010  . DIZZINESS 02/27/2010  . CHEST PAIN 02/15/2010  . HYPERCHOLESTEROLEMIA 05/13/2008    Past Surgical History:  Procedure Laterality Date  . CESAREAN SECTION      OB History    Gravida  3   Para      Term      Preterm      AB      Living  3     SAB      TAB      Ectopic      Multiple      Live Births  3            Home Medications    Prior to Admission medications   Medication Sig Start Date End Date Taking? Authorizing Provider  cyclobenzaprine (FLEXERIL) 5 MG tablet Take 1-2 tablets (5-10 mg total) by mouth 2 (two) times daily as needed for muscle spasms (muscle pain). 02/22/19   Wieters, Hallie C, PA-C  naproxen (NAPROSYN) 500 MG tablet Take 1 tablet (500 mg total) by mouth 2 (two) times daily. For pain 02/22/19   Wieters, Hallie C, PA-C  propranolol (INDERAL) 20 MG tablet Take 1 tablet (20 mg total) by mouth 3 (three) times daily. 02/22/19 02/22/19  Wieters, Hallie C, PA-C  zolpidem (AMBIEN) 5 MG tablet Take 1 tablet (5 mg total) by mouth at bedtime as needed for sleep. Patient not taking: Reported on 07/24/2017 03/19/16 02/22/19  Dessa PhiFunches, Josalyn, MD    Family History Family History  Problem Relation Age of Onset  . Hypertension Father   . Hyperlipidemia Father     Social History Social History   Tobacco Use  . Smoking status: Never Smoker  .  Smokeless tobacco: Never Used  Substance Use Topics  . Alcohol use: No  . Drug use: No     Allergies   Anesthesia s-i-60   Review of Systems Review of Systems  Constitutional: Negative for fatigue and fever.  HENT: Negative for congestion, sinus pressure and sore throat.   Eyes: Negative for photophobia, pain and visual disturbance.  Respiratory: Negative for cough and shortness of breath.   Cardiovascular: Negative for chest pain.  Gastrointestinal: Negative for abdominal pain, nausea and vomiting.  Genitourinary: Negative for decreased urine volume.  Musculoskeletal: Negative for myalgias, neck pain and neck stiffness.  Neurological: Positive for tremors and headaches. Negative for dizziness, syncope, facial asymmetry, speech difficulty, weakness, light-headedness and numbness.      Physical Exam Triage Vital Signs ED Triage Vitals  Enc Vitals Group     BP 02/22/19 1707 (!) 121/55     Pulse Rate 02/22/19 1707 (!) 53     Resp 02/22/19 1707 16     Temp 02/22/19 1707 98.4 F (36.9 C)     Temp Source 02/22/19 1707 Tympanic     SpO2 02/22/19 1707 98 %     Weight --      Height --      Head Circumference --      Peak Flow --      Pain Score 02/22/19 1714 8     Pain Loc --      Pain Edu? --      Excl. in [place]? --    No data found.  Updated Vital Signs BP (!) 121/55 (BP Location: Right Arm)   Pulse (!) 53   Temp 98.4 F (36.9 C) (Tympanic)   Resp 16   LMP 03/19/2016   SpO2 98%   Visual Acuity Right Eye Distance:   Left Eye Distance:   Bilateral Distance:    Right Eye Near:   Left Eye Near:    Bilateral Near:     Physical Exam Vitals signs and nursing note reviewed.  Constitutional:      General: She is not in acute distress.    Appearance: She is well-developed.  HENT:     Head: Normocephalic and atraumatic.  Eyes:     Extraocular Movements: Extraocular movements intact.     Conjunctiva/sclera: Conjunctivae normal.     Pupils: Pupils are equal, round, and reactive to light.  Neck:     Musculoskeletal: Neck supple.  Cardiovascular:     Rate and Rhythm: Normal rate and regular rhythm.     Heart sounds: No murmur.  Pulmonary:     Effort: Pulmonary effort is normal. No respiratory distress.     Breath sounds: Normal breath sounds.  Abdominal:     Palpations: Abdomen is soft.     Tenderness: There is no abdominal tenderness.  Musculoskeletal:     Comments: Tenderness throughout left superior trapezius and left thoracic periscapular musculature  Full active range of motion of left upper extremity   Skin:    General: Skin is warm and dry.  Neurological:     General: No focal deficit present.     Mental Status: She is alert.     Comments: Left arm visibly shaking at rest as well as when patient tries to collect first.  Grip strength 5/5  and equal bilaterally, strength at elbow 4/5 on left compared to right, shoulder strength 5/5 and equal bilaterally      UC Treatments / Results  Labs (all labs ordered are listed, but  only abnormal results are displayed) Labs Reviewed - No data to display  EKG   Radiology No results found.  Procedures Procedures (including critical care time)  Medications Ordered in UC Medications - No data to display  Initial Impression / Assessment and Plan / UC Course  I have reviewed the triage vital signs and the nursing notes.  Pertinent labs & imaging results that were available during my care of the patient were reviewed by me and considered in my medical decision making (see chart for details).    Patient has resting tremor to left hand x1 year, progressive worsening. Strength intact, no other neuro deficits.  Patient has previously been on propanolol 10mg  TID, initially discussed with patient increasing dose and restarting this, but after review of vital signs and current heart rate 53 without propanolol; called patient and advised to NOT take propanolol until she is able to follow-up with primary care and have recheck of her heart rate.  Discussed symptoms of low heart rate and that this medicine may cause syncope if taken.  Called [facility] and canceled propanolol to ensure patient does not take this mistakingly.  Patient verbalized understanding to not proceed with taking 10 mg propanolol tablets that she currently has.  Does have tenderness throughout her upper trapezius and periscapular musculature, will have continue Naprosyn and Flexeril as needed for this.  Ambulatory referral to neurology attempted to place as well as provided contact information for establishing primary care with [facility] internal medicine.  Final Clinical Impressions(s) / UC Diagnoses   Final diagnoses:  Tremor of left hand     Discharge Instructions     Please use propranolol as needed for tremor  every 8 hours Naprosyn twice daily with food for pain Flexeril for back pain- do not drive/work after taking  Follow up with internal medicine/neurology- contact below   ED Prescriptions    Medication Sig Dispense Auth. Provider   naproxen (NAPROSYN) 500 MG tablet Take 1 tablet (500 mg total) by mouth 2 (two) times daily. For pain 30 tablet Wieters, Hallie C, PA-C   cyclobenzaprine (FLEXERIL) 5 MG tablet Take 1-2 tablets (5-10 mg total) by mouth 2 (two) times daily as needed for muscle spasms (muscle pain). 24 tablet Wieters, Hallie C, PA-C   propranolol (INDERAL) 20 MG tablet  (Status: Discontinued) Take 1 tablet (20 mg total) by mouth 3 (three) times daily. 60 tablet Wieters, Cheswold C, PA-C     PDMP not reviewed this encounter.   Villa Rodriguez, Lew Dawes 02/22/19 1925

## 2019-02-22 NOTE — Discharge Instructions (Signed)
Please use propranolol as needed for tremor every 8 hours Naprosyn twice daily with food for pain Flexeril for back pain- do not drive/work after taking  Follow up with internal medicine/neurology- contact below

## 2019-02-22 NOTE — ED Triage Notes (Signed)
Pt presents to UC w/ c/o left arm and left leg shaking which has gotten worse since December 2019. Pt has not been able to be seen by neurologist. Pt states that her left arm and leg becomes numb and painful at night. Pt also reports her abdomen has become increasingly more distended since 1 month.

## 2019-02-23 ENCOUNTER — Encounter: Payer: Self-pay | Admitting: Neurology

## 2019-03-25 ENCOUNTER — Ambulatory Visit: Payer: Medicaid Other | Attending: Family Medicine | Admitting: Family Medicine

## 2019-03-25 ENCOUNTER — Encounter: Payer: Self-pay | Admitting: Family Medicine

## 2019-03-25 ENCOUNTER — Other Ambulatory Visit: Payer: Self-pay

## 2019-03-25 DIAGNOSIS — R251 Tremor, unspecified: Secondary | ICD-10-CM | POA: Diagnosis not present

## 2019-03-25 DIAGNOSIS — R7309 Other abnormal glucose: Secondary | ICD-10-CM

## 2019-03-25 DIAGNOSIS — R32 Unspecified urinary incontinence: Secondary | ICD-10-CM

## 2019-03-25 NOTE — Progress Notes (Signed)
Virtual Visit via Telephone Note  I connected with Marissa Garcia on 03/25/19 at  2:30 PM EST by telephone and verified that I am speaking with the correct person using two identifiers.   I discussed the limitations, risks, security and privacy concerns of performing an evaluation and management service by telephone and the availability of in person appointments. I also discussed with the patient that there may be a patient responsible charge related to this service. The patient expressed understanding and agreed to proceed.  Patient Location: home Provider Location: [facility] Others participating in call: Call initiated by [place], CMA who obtained Spanish-speaking interpreter through [facility] interpretation services due to language barrier   History of Present Illness:        53 yo female who was last seen here in the office on 03/19/2016 who is status post recent emergency department visit at [facility] on 02/22/2019 due to complaint of shaking/tremor in her left arm.  She reports that the tremor has been present since she had a fall about a year ago.  Patient reports that she fell off of a ladder but does not really recall what happened.  Patient is right-handed but the tremor is primarily in her left hand and occurs at rest and gets worse if she is trying to hold anything in her left hand.  She reports that she was previously prescribed a muscle relaxant as well as naproxen due to developing pain and muscle spasms in her left arm and upper back from the constant shaking/tremor.  She also was placed on a medication, propranolol at one point in the past to see if this would help but so far nothing has helped with the tremor in her left arm.        She denies any current issues with headaches or dizziness, no chest pain or palpitations, no shortness of breath or cough.  She does not feel as if she has any focal weakness in any of her extremities but has some pain in the left upper arm  due to the recurrent tremor.  She denies any abdominal pain-no nausea/vomiting/diarrhea or constipation.  She has felt as if she has had some recent onset of inability to hold her urine.  She reports that she cannot hold her urine when she gets the urge to urinate.  She sometimes has leakage with coughing, sneezing.  She denies any issues with bowel dysfunction.   Past Medical History:  Diagnosis Date  . Constipation   . Headache   . Hypercholesteremia     Past Surgical History:  Procedure Laterality Date  . CESAREAN SECTION      Family History  Problem Relation Age of Onset  . Hypertension Father   . Hyperlipidemia Father     Social History   Tobacco Use  . Smoking status: Never Smoker  . Smokeless tobacco: Never Used  Substance Use Topics  . Alcohol use: No  . Drug use: No     Allergies  Allergen Reactions  . Propofol        Observations/Objective: No vital signs or physical exam conducted as visit was done via telephone  Assessment and Plan: 1. Tremor ; encounter for examination following treatment at hospital  Notes from patient's recent ED visit on 02/22/2019 reviewed.  Initial plan had been to have patient restart use of propranolol to help with the tremor however her heart rate in the emergency department was 53 therefore she had been contacted to not restart  propranolol at this time.  She was given refills of naproxen and Flexeril to help with pain and muscle spasm.  Patient reports that she does have an upcoming appointment with neurology for further evaluation of her tremor.  Review of chart, patient with ED visit on 10/13/2018 due to a fall from a ladder resulting in T12 burst fracture.  2. Urinary incontinence, unspecified type; 3.  Elevated glucose She reports that she has had some recent issues with urinary incontinence.  She has been asked to schedule lab visit to come in and have urinalysis and urine culture to look for possible urinary tract infection as the  cause of her urinary issues.  On review of chart, she has had prior blood work with glucose of 117 on 10/13/2018 therefore she will also have basic metabolic panel and hemoglobin A1c at her upcoming lab visit to look for possible prediabetes or diabetes which may be contributing to her issues with urinary incontinence.  If no other causes found on lab evaluation, she may need neurologic evaluation due to history of T12 burst fracture, fall from a ladder in June of 7341. - Basic Metabolic Panel - POCT URINALYSIS DIP (CLINITEK) - Urine Culture - Hemoglobin A1c   Follow Up Instructions:Return in about 2 months (around 05/25/2019) for tremor-follow-up after visit with Neurology.    I discussed the assessment and treatment plan with the patient. The patient was provided an opportunity to ask questions and all were answered. The patient agreed with the plan and demonstrated an understanding of the instructions.   The patient was advised to call back or seek an in-person evaluation if the symptoms worsen or if the condition fails to improve as anticipated.  I provided 16 minutes of non-face-to-face time during this encounter.   Antony Blackbird, MD

## 2019-03-25 NOTE — Progress Notes (Signed)
Patient verified DOB Patient has eaten today. Patient has not taken medication today. Patient complains of tremors for 1 year in her hand which is constant/progressive for the past 4 months after a fall. Patient states medications that were prescribed over the year have not helped. Patient shares her last ED visit resulted in patient needing a neurology referral for a chronic medication for tremors. Patient complains of incontinence of urine over the past few months.

## 2019-03-26 LAB — BASIC METABOLIC PANEL WITH GFR
BUN/Creatinine Ratio: 16 (ref 9–23)
BUN: 8 mg/dL (ref 6–24)
CO2: 23 mmol/L (ref 20–29)
Calcium: 9.2 mg/dL (ref 8.7–10.2)
Chloride: 103 mmol/L (ref 96–106)
Creatinine, Ser: 0.51 mg/dL — ABNORMAL LOW (ref 0.57–1.00)
GFR calc Af Amer: 128 mL/min/1.73
GFR calc non Af Amer: 111 mL/min/1.73
Glucose: 139 mg/dL — ABNORMAL HIGH (ref 65–99)
Potassium: 4 mmol/L (ref 3.5–5.2)
Sodium: 139 mmol/L (ref 134–144)

## 2019-03-26 LAB — HEMOGLOBIN A1C
Est. average glucose Bld gHb Est-mCnc: 111 mg/dL
Hgb A1c MFr Bld: 5.5 % (ref 4.8–5.6)

## 2019-03-27 LAB — URINE CULTURE: Organism ID, Bacteria: NO GROWTH
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
## 2019-04-21 ENCOUNTER — Ambulatory Visit: Payer: Medicaid Other | Admitting: Neurology

## 2019-04-22 ENCOUNTER — Encounter: Payer: Self-pay | Admitting: Neurology

## 2019-04-22 ENCOUNTER — Other Ambulatory Visit: Payer: Self-pay

## 2019-04-22 ENCOUNTER — Telehealth: Payer: Self-pay

## 2019-04-22 ENCOUNTER — Ambulatory Visit (INDEPENDENT_AMBULATORY_CARE_PROVIDER_SITE_OTHER): Payer: Medicaid Other | Admitting: Clinical

## 2019-04-22 ENCOUNTER — Ambulatory Visit (INDEPENDENT_AMBULATORY_CARE_PROVIDER_SITE_OTHER): Payer: Medicaid Other | Admitting: Neurology

## 2019-04-22 VITALS — BP 118/61 | HR 75 | Resp 16 | Ht 58.66 in | Wt 142.0 lb

## 2019-04-22 DIAGNOSIS — R251 Tremor, unspecified: Secondary | ICD-10-CM

## 2019-04-22 DIAGNOSIS — S22081A Stable burst fracture of T11-T12 vertebra, initial encounter for closed fracture: Secondary | ICD-10-CM | POA: Diagnosis not present

## 2019-04-22 DIAGNOSIS — F4329 Adjustment disorder with other symptoms: Secondary | ICD-10-CM | POA: Diagnosis not present

## 2019-04-22 DIAGNOSIS — W19XXXS Unspecified fall, sequela: Secondary | ICD-10-CM

## 2019-04-22 DIAGNOSIS — M5416 Radiculopathy, lumbar region: Secondary | ICD-10-CM | POA: Insufficient documentation

## 2019-04-22 DIAGNOSIS — G20 Parkinson's disease: Secondary | ICD-10-CM

## 2019-04-22 MED ORDER — PRAMIPEXOLE DIHYDROCHLORIDE 0.5 MG PO TABS: 0.5000 mg | ORAL_TABLET | Freq: Three times a day (TID) | ORAL | 1 refills | Status: DC

## 2019-04-22 MED ORDER — PRAMIPEXOLE DIHYDROCHLORIDE 0.125 MG PO TABS: ORAL_TABLET | ORAL | 0 refills | Status: DC

## 2019-04-22 NOTE — BH Specialist Note (Signed)
Referring Provider: Alonza Bogus, DO Date of Referral: 04/22/2019 Primary Reason for Referral: stress reaction  Location of Visit: Individual, office visit. Interpreter present Suicide/Homicide Risk: Pt denies risk  Assessment Patient presents today for psychoeducation with LCSW following with new diagnosis of Parkinson's Disease by referring provider Dr. Wells Guiles Tat. Pt presents with acute stress reaction following new dx of PD, pt tearful throughout visit. LCSW provided supportive counseling as pt details feelings of overwhelm and fear re diagnosis. LCSW provided pt education re importance of medication management, exercise, and self-care to influence  the disease course. LCSW provided pt with information about our support and educational groups for patients with Parkinson's as well as their care partners, also discussed the importance of forced intense exercise in the management of PD and provided information about current exercise opportunities. Also discussed availability of individual counseling sessions to address the adjustment of living with chronic disease of Parkinson's, invited patient to schedule with LCSW and appt was scheduled in ~3 weeks after Holidays. Pt responded receptively to patient education today and supportive counseling today.   Brief Interventions provided today in session 1. psychoeducation, patient education 2. Supportive counseling   Plan 1. Read "Newly Diagnosed"/ FAQs (espanol) ([facility]), Information provided to pt. Contact LCSW with any questions related to Parkinson's & behavioral health  2. Goal for exercise  Behavioral Health treatment recommendations communicated to referring provider and pt states agreement with plan. LCSW will remain available for future consultation.

## 2019-04-22 NOTE — Telephone Encounter (Signed)
-----   Message from [place], DO sent at 04/22/2019 12:44 PM EST ----- Pt needs tsh.  I told her this when in clinic but forgot to order it.  Dx:  tremor

## 2019-04-22 NOTE — Patient Instructions (Addendum)
Start mirapex (pramipexole) as follows:  0.125 mg - 1 tablet three times per day for a week, then 2 tablets three times per day for a week and then fill the 0.5 mg tablet and take that, 1 pill three times per day   The physicians and staff at [facility] are committed to providing excellent care. You may receive a survey requesting feedback about your experience at our office. We strive to receive "very good" responses to the survey questions. If you feel that your experience would prevent you from giving the office a "very good " response, please contact our office to try to remedy the situation. We may be reached at [phone]. Thank you for taking the time out of your busy day to complete the survey.  

## 2019-04-29 ENCOUNTER — Encounter: Payer: Self-pay | Admitting: *Deleted

## 2019-05-04 ENCOUNTER — Other Ambulatory Visit: Payer: Self-pay | Admitting: Student

## 2019-05-04 DIAGNOSIS — M5416 Radiculopathy, lumbar region: Secondary | ICD-10-CM

## 2019-05-04 DIAGNOSIS — S22081A Stable burst fracture of T11-T12 vertebra, initial encounter for closed fracture: Secondary | ICD-10-CM

## 2019-05-10 ENCOUNTER — Ambulatory Visit: Payer: Medicaid Other | Attending: Internal Medicine

## 2019-05-10 DIAGNOSIS — Z20828 Contact with and (suspected) exposure to other viral communicable diseases: Secondary | ICD-10-CM | POA: Diagnosis not present

## 2019-05-10 DIAGNOSIS — Z20822 Contact with and (suspected) exposure to covid-19: Secondary | ICD-10-CM

## 2019-05-12 LAB — NOVEL CORONAVIRUS, NAA: SARS-CoV-2, NAA: NOT DETECTED

## 2019-05-17 ENCOUNTER — Institutional Professional Consult (permissible substitution): Payer: Medicaid Other | Admitting: Clinical

## 2019-05-17 ENCOUNTER — Telehealth: Payer: Self-pay | Admitting: Clinical

## 2019-05-17 NOTE — Telephone Encounter (Signed)
LCSW attempt to reach pt for virtual SW appt, no answer. Please reschedule pt if she returns call

## 2019-05-20 ENCOUNTER — Telehealth: Payer: Self-pay | Admitting: Neurology

## 2019-05-20 ENCOUNTER — Telehealth: Payer: Self-pay | Admitting: Clinical

## 2019-05-20 NOTE — Telephone Encounter (Signed)
Oh, I understand that. I can work with her on psychoeducation of medication and med adherence, just wanted to make sure she didn't also need an appt with you :)

## 2019-05-20 NOTE — Telephone Encounter (Signed)
Please call patient and let her know that [place] let me know that pt not taking her pramipexole or not taking it faithfully/correctly.  Let her know that she really needs to take that and titrate up on it or she will never feel better.  The medication will help her symptoms if she takes it as directed.

## 2019-05-20 NOTE — Telephone Encounter (Signed)
Pt attended Parkinson's support group and informed me that she has been on/off of carbidopa/levodopa,reports she is fearful of meds and Parkinson's dx. I told pt we need to reschedule her counseling appt which she missed and that I would inform provider of medication concerns

## 2019-05-20 NOTE — Telephone Encounter (Signed)
Spoke with patient in detail she verbalized understanding and declined a Nurse, learning disability. She stated that she got confused at the pharmacy because she could not pick up both at the same time and I explained to her that because it is the same medication but a higher dose she can not pick them both up until she has titrated herself to that dosing. She said she will start taking the medication how she is supposed to and that she understands how to take the medication

## 2019-05-24 ENCOUNTER — Ambulatory Visit
Admission: RE | Admit: 2019-05-24 | Discharge: 2019-05-24 | Disposition: A | Discharge status: Home / Self Care | Payer: Medicaid Other | Source: Ambulatory Visit | Attending: Neurology | Admitting: Neurology

## 2019-05-24 ENCOUNTER — Other Ambulatory Visit: Payer: Self-pay

## 2019-05-24 DIAGNOSIS — G20 Parkinson's disease: Secondary | ICD-10-CM

## 2019-05-24 DIAGNOSIS — W19XXXS Unspecified fall, sequela: Secondary | ICD-10-CM

## 2019-05-24 DIAGNOSIS — S0990XA Unspecified injury of head, initial encounter: Secondary | ICD-10-CM | POA: Diagnosis not present

## 2019-05-24 IMAGING — MR MR HEAD W/O CM
10 series · 48 of 48 positions shown · non-contrast
Comparison: Head CT [DATE].

CLINICAL DATA: 53-year-old female with Parkinson's disease. Tremor,
fall, confusion.

EXAM:
MRI HEAD WITHOUT CONTRAST
TECHNIQUE: Multiplanar, multiecho pulse sequences of the brain and surrounding
structures were obtained without intravenous contrast.

[Series 2: T1 · sagittal · 5.0mm · 0.45mm/px · 3 of 21 slices shown]
[im 1/21]
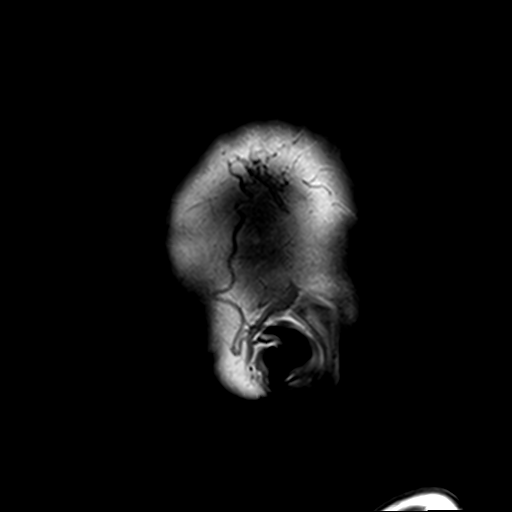
[im 11/21]
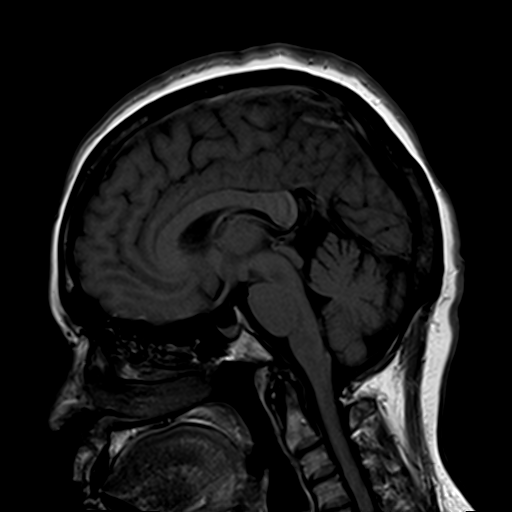
[im 21/21]
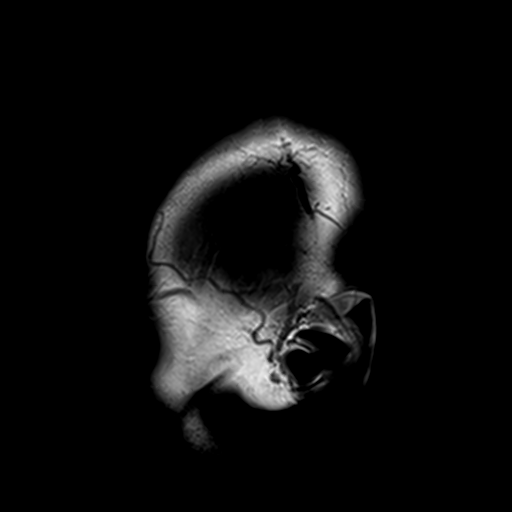

[Series 3: DWI · axial · 3.0mm · 1.80mm/px · z∈[-55,+90]mm · 9 of 99 slices shown (1 of 4)]
[im 1/99]
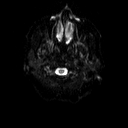
[im 13/99]
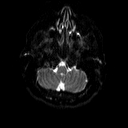
[im 25/99]
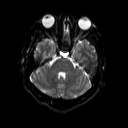
[im 37/99]
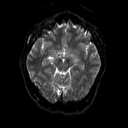
[im 50/99]
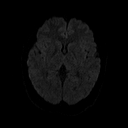
[im 62/99]
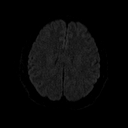
[im 74/99]
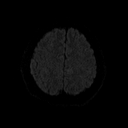
[im 86/99]
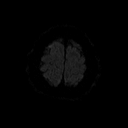
[im 99/99]
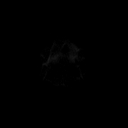

[Series 4: DWI · axial · 3.0mm · 1.80mm/px · z∈[-55,+90]mm · 4 of 49 slices shown (2 of 4)]
[im 1/49]
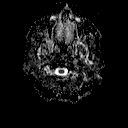
[im 17/49]
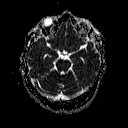
[im 33/49]
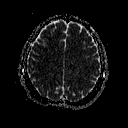
[im 49/49]
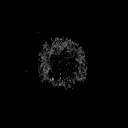

[Series 5: DWI · coronal · 5.0mm · 1.80mm/px · 6 of 67 slices shown (3 of 4)]
[im 1/67]
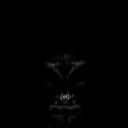
[im 14/67]
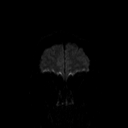
[im 27/67]
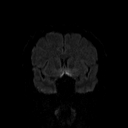
[im 40/67]
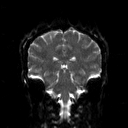
[im 53/67]
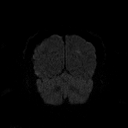
[im 67/67]
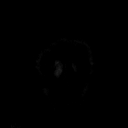

[Series 6: DWI · coronal · 5.0mm · 1.80mm/px · 3 of 34 slices shown (4 of 4)]
[im 1/34]
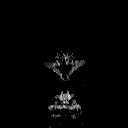
[im 17/34]
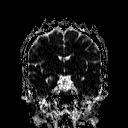
[im 34/34]
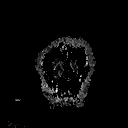

[Series 7: T2 · axial · 5.0mm · 0.51mm/px · z∈[-62,+80]mm · 2 of 22 slices shown (1 of 2)]
[im 1/22]
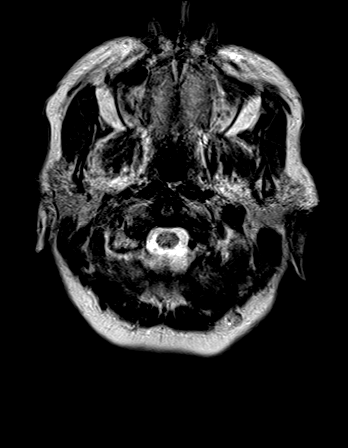
[im 22/22]
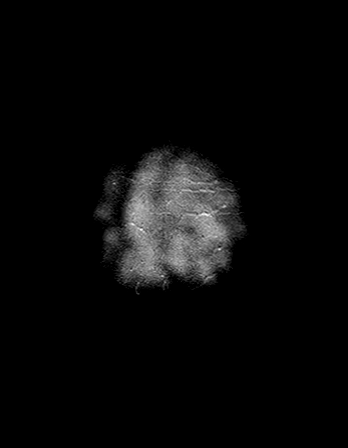

[Series 8: FLAIR · axial · 3.0mm · 0.45mm/px · z∈[-61,+80]mm · 3 of 32 slices shown]
[im 1/32]
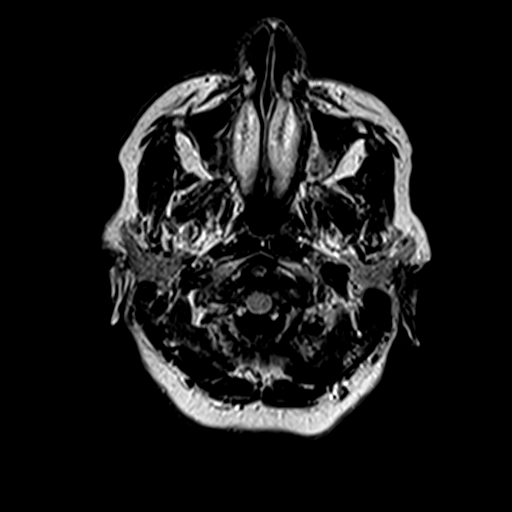
[im 16/32]
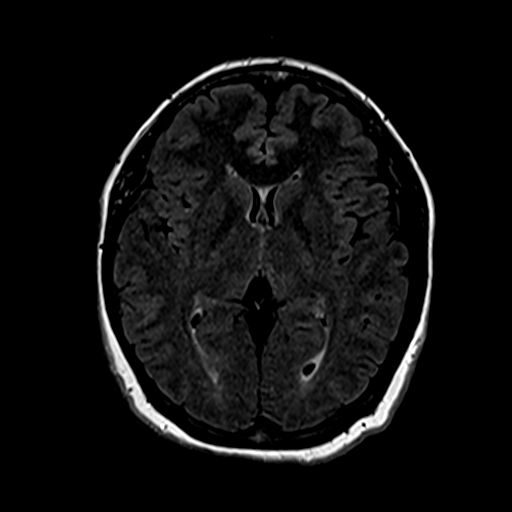
[im 32/32]
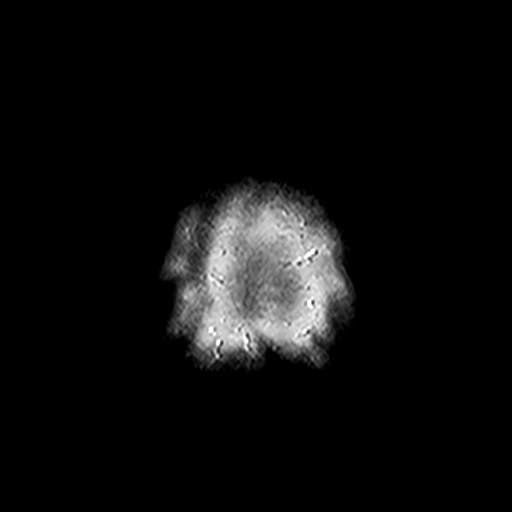

[Series 10: swi_images · axial · 4.0mm · 0.90mm/px · z∈[-58,+79]mm · 3 of 36 slices shown]
[im 1/36]
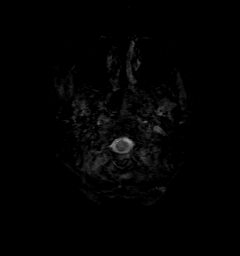
[im 18/36]
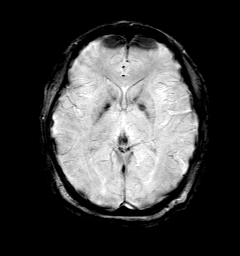
[im 36/36]
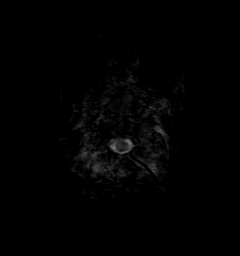

[Series 11: t1_mpr_tra · axial · 1.0mm · 0.71mm/px · z∈[-57,+84]mm · 13 of 144 slices shown]
[im 1/144]
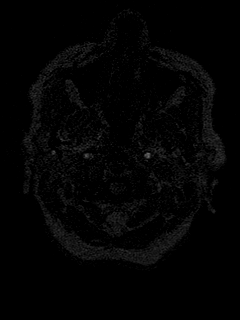
[im 12/144]
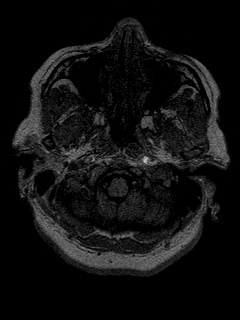
[im 24/144]
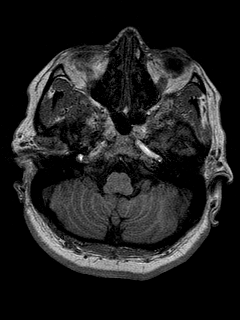
[im 36/144]
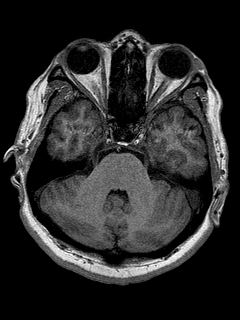
[im 48/144]
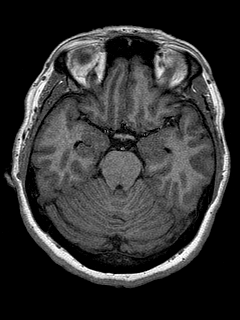
[im 60/144]
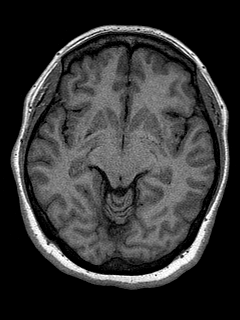
[im 72/144]
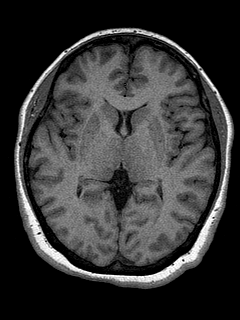
[im 84/144]
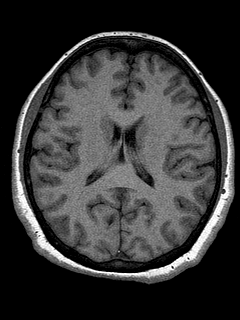
[im 96/144]
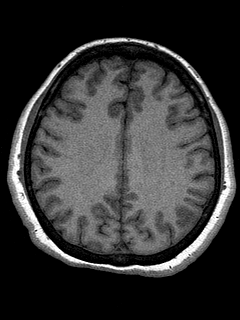
[im 108/144]
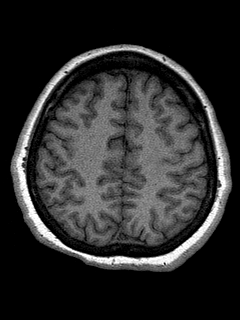
[im 120/144]
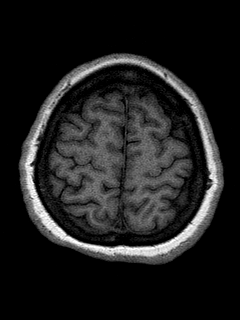
[im 132/144]
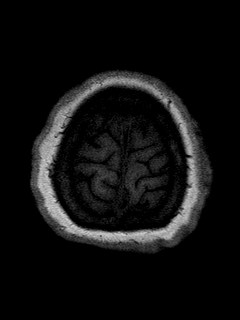
[im 144/144]
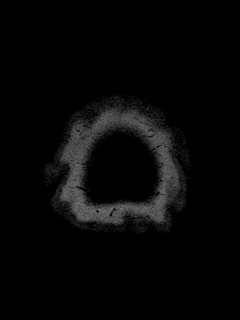

[Series 12: T2 · coronal · 5.0mm · 0.45mm/px · 2 of 27 slices shown (2 of 2)]
[im 1/27]
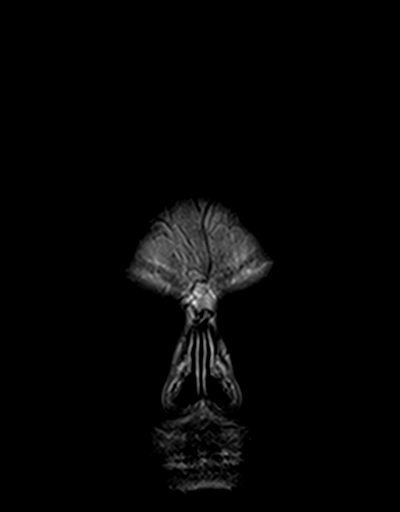
[im 27/27]
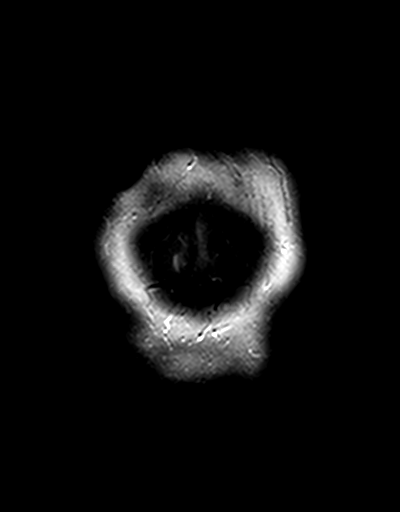

[48 of 48 positions shown; findings below may reference images not displayed]

FINDINGS: Brain: Cerebral volume is within normal limits for age. No
restricted diffusion to suggest acute infarction. No midline shift,
mass effect, evidence of mass lesion, ventriculomegaly, extra-axial
collection or acute intracranial hemorrhage. Cervicomedullary
junction within normal limits. Partially empty sella again noted.

Gray and white matter signal is within normal limits for age
throughout the brain. No cortical encephalomalacia or chronic
cerebral blood products identified. The deep gray nuclei, brainstem
and cerebellum appear within normal limits.

Vascular: Major intracranial vascular flow voids are preserved.

Skull and upper cervical spine: Negative visible cervical spine.
Visualized bone marrow signal is within normal limits.

Sinuses/Orbits: Negative orbits. Well pneumatized paranasal sinuses
and mastoids.

Other: Grossly normal visible internal auditory structures, scalp
and face soft tissues.
IMPRESSION: No acute intracranial abnormality and noncontrast MRI appearance of
the brain is within normal limits for age.

## 2019-05-26 ENCOUNTER — Ambulatory Visit (INDEPENDENT_AMBULATORY_CARE_PROVIDER_SITE_OTHER): Payer: Medicaid Other | Admitting: Clinical

## 2019-05-26 ENCOUNTER — Other Ambulatory Visit: Payer: Self-pay

## 2019-05-26 ENCOUNTER — Telehealth: Payer: Self-pay | Admitting: Clinical

## 2019-05-26 DIAGNOSIS — F411 Generalized anxiety disorder: Secondary | ICD-10-CM | POA: Diagnosis not present

## 2019-05-26 NOTE — Telephone Encounter (Signed)
LCSW LVM trying to reach pt for virtual appt.

## 2019-05-28 NOTE — BH Specialist Note (Signed)
Referring Provider: Kerin Salen, DO Date of Referral: 04/22/2019 Primary Reason for Referral: stress reaction  Location of Visit: Individual, office visit Suicide/Homicide Risk: Pt denies risk  Assessment Patient presents for follow-up today with LCSW following with new diagnosis of Parkinson's Disease by referring provider Dr. Lurena Joiner Tat. Pt presents h/o anxiety and continued sx of stress reaction regarding new dx of PD ~1 mo ago.  LCSW provided supportive counseling and psychoeducation as pt discussed non-adherence to mirapex. Pt states she did not understand mechanism of action but now has better understanding and agrees to medication plan as detailed by provider in 04/22/19 OV. Additionally, pt and LCSW spent time discussing cultural beliefs regarding medication which pt acknowledges impacts adherence. Of concern, pt reports difficulty with memory and may benefit from further assessment, LCSW will consult with referring provider. To note, pt scheduled virtual OV but attended in-person, she went to 2 other provider offices because she could not recall my location despite meeting me here previously and attending zoom events.    LCSW provided pt education re importance of medication management, exercise, and self-care to influence  the disease course. Pt has attended PD  education zoom group led by LCSW and is interested in exercise classes. Again  discussed availability of individual counseling sessions as needed to address the adjustment of living with chronic disease of Parkinson's. Pt would likely benefit from medication management of anxiety. To note, pt was taking prozac but current script is expired. I will consult with provider for recommendation. Pt responded receptively to patient education today and supportive counseling today.   Brief Interventions provided today in session 1. psychoeducation 2. Supportive counseling   Plan 1. Take Mirapex as directed   2. Goal for  exercise  Behavioral Health treatment recommendations communicated to referring provider and pt states agreement with plan. LCSW will remain available for future consultation.

## 2019-05-31 ENCOUNTER — Telehealth: Payer: Self-pay | Admitting: Clinical

## 2019-05-31 NOTE — Telephone Encounter (Signed)
LCSW discussed exercise options with patient, sent cycling information to pt via email as requested

## 2019-06-07 ENCOUNTER — Other Ambulatory Visit: Payer: Medicaid Other

## 2019-06-10 ENCOUNTER — Other Ambulatory Visit: Payer: Self-pay

## 2019-06-10 ENCOUNTER — Ambulatory Visit (INDEPENDENT_AMBULATORY_CARE_PROVIDER_SITE_OTHER): Payer: Medicaid Other | Admitting: Clinical

## 2019-06-10 ENCOUNTER — Telehealth: Payer: Self-pay | Admitting: Clinical

## 2019-06-10 DIAGNOSIS — F411 Generalized anxiety disorder: Secondary | ICD-10-CM | POA: Diagnosis not present

## 2019-06-10 NOTE — Telephone Encounter (Signed)
Pt completed RSB orientation

## 2019-06-15 ENCOUNTER — Ambulatory Visit
Admission: RE | Admit: 2019-06-15 | Discharge: 2019-06-15 | Disposition: A | Discharge status: Home / Self Care | Payer: Medicaid Other | Source: Ambulatory Visit | Attending: Student | Admitting: Student

## 2019-06-15 ENCOUNTER — Other Ambulatory Visit: Payer: Self-pay

## 2019-06-15 DIAGNOSIS — M5416 Radiculopathy, lumbar region: Secondary | ICD-10-CM

## 2019-06-15 DIAGNOSIS — M5124 Other intervertebral disc displacement, thoracic region: Secondary | ICD-10-CM | POA: Diagnosis not present

## 2019-06-15 DIAGNOSIS — M48061 Spinal stenosis, lumbar region without neurogenic claudication: Secondary | ICD-10-CM | POA: Diagnosis not present

## 2019-06-15 DIAGNOSIS — S22081A Stable burst fracture of T11-T12 vertebra, initial encounter for closed fracture: Secondary | ICD-10-CM

## 2019-06-15 IMAGING — MR MR THORACIC SPINE W/O CM
4 of 6 series · 13 of 48 positions shown · non-contrast
Comparison: None.

CLINICAL DATA: Prior burst fracture [DATE], continued pain

EXAM:
MRI thoracic and LUMBAR SPINE WITHOUT CONTRAST
TECHNIQUE: Multiplanar, multisequence MR imaging of the thoracic and lumbar
spine was performed. No intravenous contrast was administered.

[Series 4: T2 · sagittal · 4.0mm · 0.39mm/px · 4 of 14 slices shown (1 of 3)]
[im 1/14]
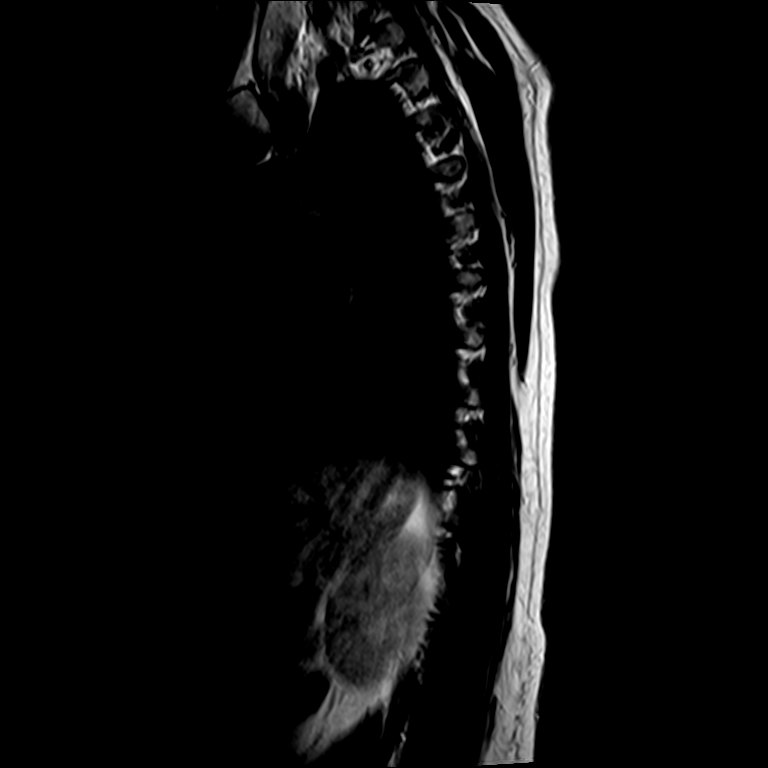
[im 4/14]
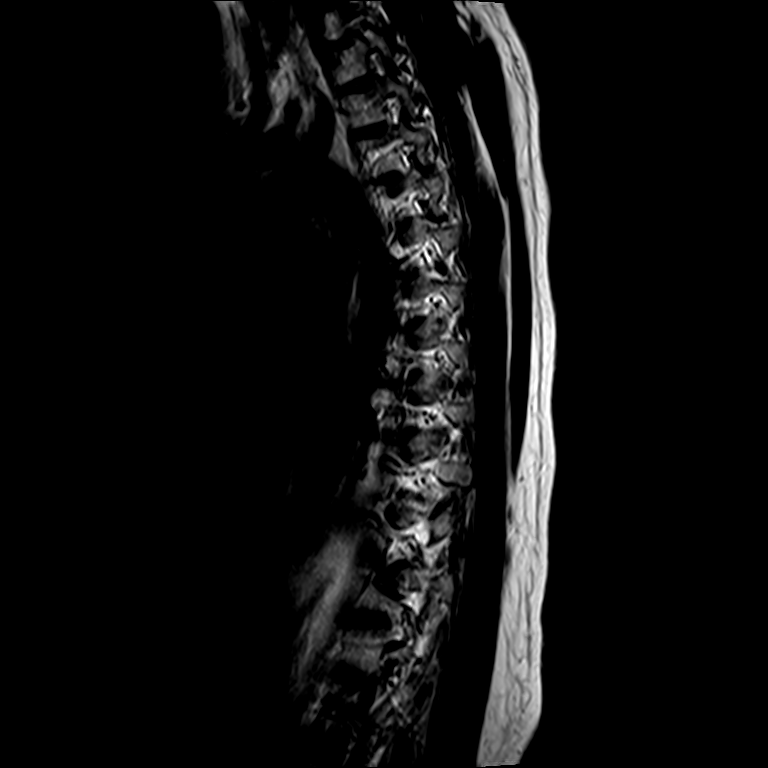
[im 7/14]
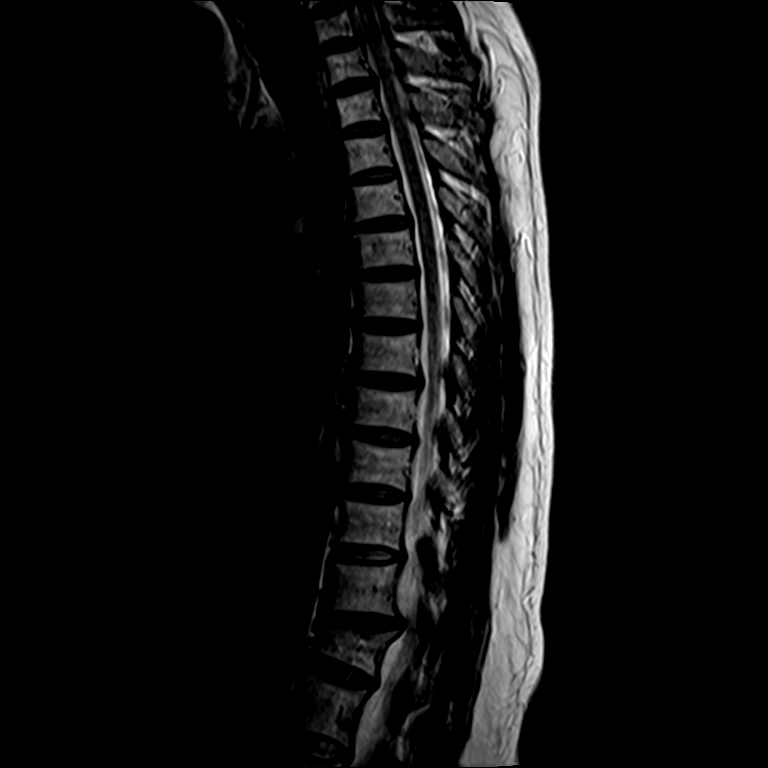
[im 14/14]
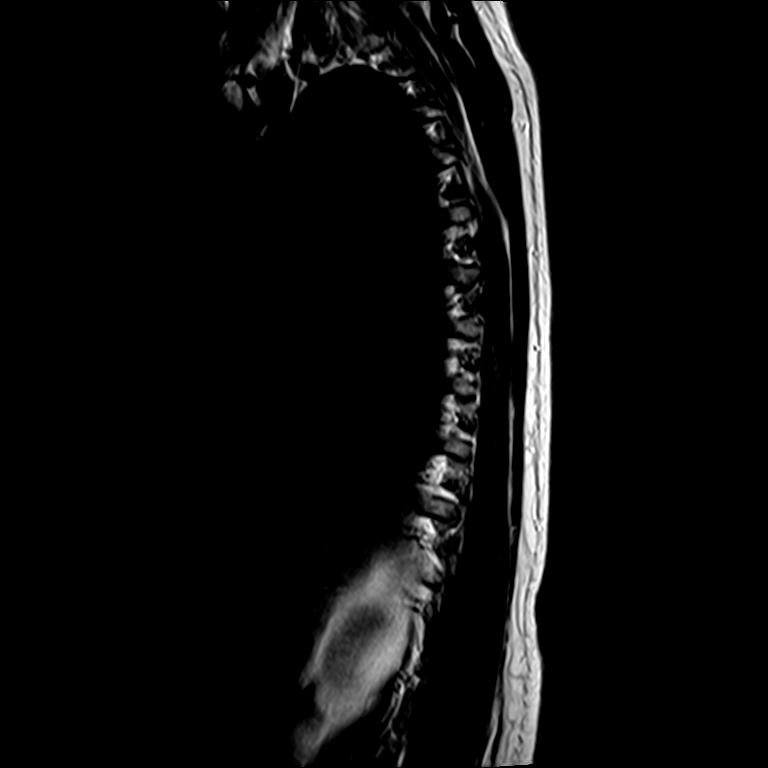

[Series 5: T1 · sagittal · 4.0mm · 0.78mm/px · 3 of 14 slices shown]
[im 3/14]
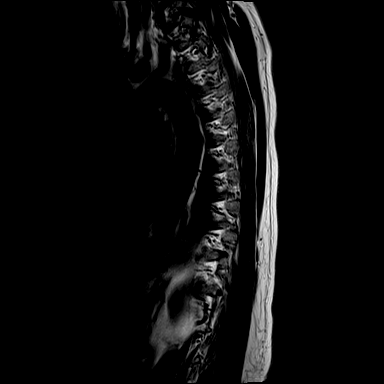
[im 8/14]
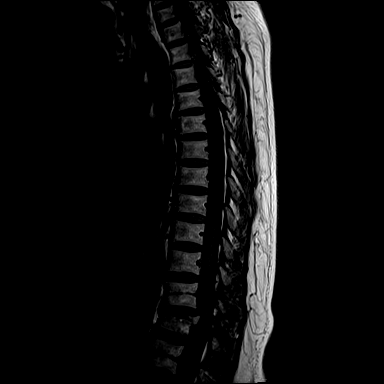
[im 14/14]
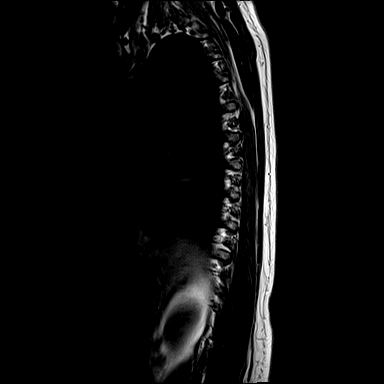

[Series 7: T2 · axial · 4.0mm · 0.39mm/px · z∈[-216,-78]mm · 3 of 35 slices shown (2 of 3)]
[im 6/35]
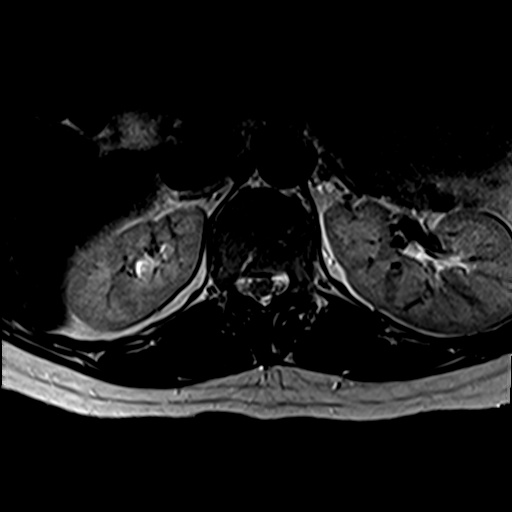
[im 19/35]
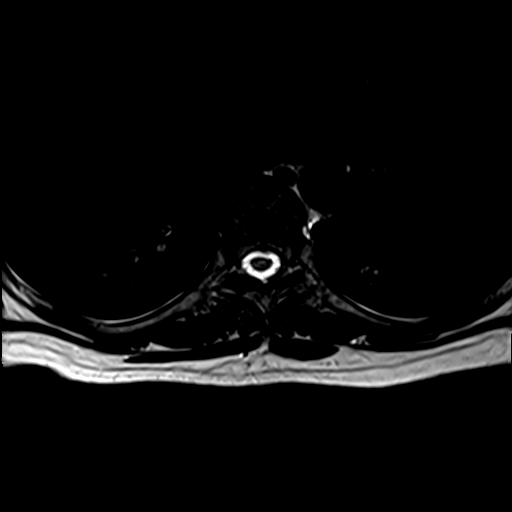
[im 29/35]
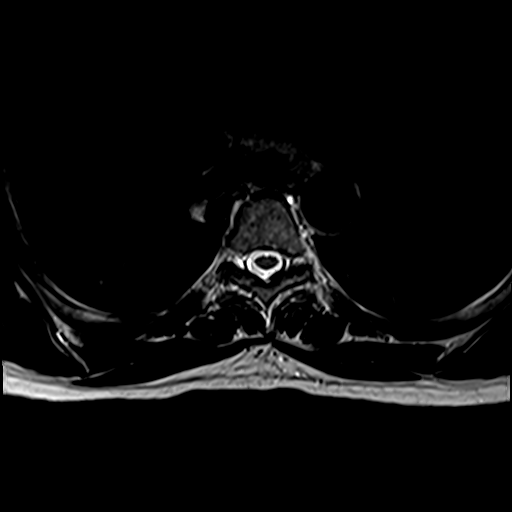

[Series 8: T2 · axial · 4.0mm · 0.39mm/px · z∈[-217,-77]mm · 3 of 35 slices shown (3 of 3)]
[im 6/35]
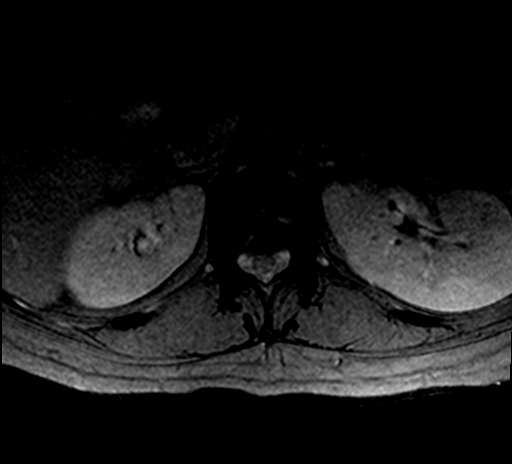
[im 19/35]
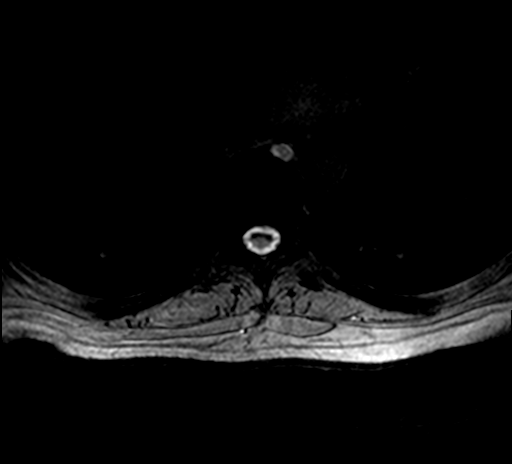
[im 29/35]
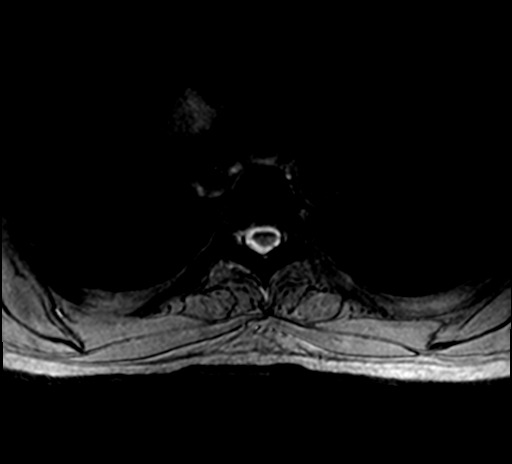

[13 of 48 positions shown; findings below may reference images not displayed]

FINDINGS: Alignment: There is a slight focal kyphosis centered at the T12
vertebral body.

Vertebrae: A healed fracture deformity seen at the posterosuperior
cortex of the T12 vertebral body with buckling of the cortex. There
is approximately 25% loss in anterior vertebral body height. No
acute fracture, marrow edema, or pathologic infiltration.

Cord: Normal in signal and morphology.

Paraspinal and other soft tissues: Normal appearance to the
paraspinal soft tissues and retroperitoneum.

Disc levels:

T1-T2: No significant canal or neural foraminal narrowing

T2-T3: No significant canal or neural foraminal narrowing

T3-T4: No significant canal or neural foraminal narrowing

T4-T5: No significant canal or neural foraminal narrowing

T5-T6: No significant canal or neural foraminal narrowing

T6-T7: No significant canal or neural foraminal narrowing

T7-T8: No significant canal or neural foraminal narrowing

T8-T9: No significant canal or neural foraminal narrowing

T9-T10: No significant canal or neural foraminal narrowing

T10-T11: No significant canal or neural foraminal narrowing

T11-T12: Due to buckling of the posterosuperior cortex of T12 there
is a tiny focal central disc protrusion which causes mild central
canal stenosis. There is also facet arthrosis which causes mild
bilateral neural foraminal narrowing.

Lumbar spine:

Segmentation: There are 5 non-rib bearing lumbar type vertebral
bodies with the last intervertebral disc space labeled as L5-S1.

Alignment:  Normal

Vertebrae: There is a healed fracture deformity of the
posterosuperior cortex of the T12 vertebral body. No fracture,
marrow edema,or pathologic marrow infiltration. There is a T1/T2
bright osseous lesion within the L4 vertebral body, likely
intraosseous hemangioma.

Conus medullaris and cauda equina: Conus extends to the T12-L1
level. Conus and cauda equina appear normal.

Paraspinal and other soft tissues: The paraspinal soft tissues and
visualized retroperitoneal structures are unremarkable. The
sacroiliac joints are intact.

Disc levels:

T12-L1: There is a minimal broad-based disc bulge and facet
arthrosis which causes mild bilateral neural foraminal narrowing and
mild central canal narrowing.

L1-L2:   No significant canal or neural foraminal narrowing.

L2-L3: There is a minimal broad-based disc bulge and facet arthrosis
which causes mild bilateral neural foraminal narrowing and no
central canal stenosis.

L3-L4: There is a minimal broad-based disc bulge and facet arthrosis
which causes mild bilateral neural foraminal narrowing, however no
significant canal stenosis.

L4-L5: There is a broad-based disc bulge and facet arthrosis which
causes mild bilateral neural foraminal narrowing and moderate
central canal stenosis

L5-S1: There is a broad-based disc bulge with a left paracentral
disc protrusion which contacts and impinges the descending left S1
nerve root. There is a posterior annular fissure also noted. Facet
arthrosis is seen which causes moderate bilateral neural foraminal
narrowing. There is mild central canal stenosis.
IMPRESSION: 1. Healed fracture deformity of the T12 vertebral body with 25% loss
in anterior vertebral body height and buckling of the
posterosuperior cortex. This causes mild central canal stenosis and
bilateral neural foraminal narrowing at T11-T12.
2. Lumbar spine spondylosis most notable at L5-S1 with a left
paracentral disc protrusion and annular fissure which contacts and
impinges the descending left S1 nerve root. There is moderate
bilateral neural foraminal narrowing at this level.
3. also at L4-L5 there is a broad-based disc bulge which causes
moderate central canal stenosis.

## 2019-06-15 IMAGING — MR MR LUMBAR SPINE W/O CM
4 of 5 series · 25 of 48 positions shown · non-contrast
Comparison: None.

CLINICAL DATA: Prior burst fracture [DATE], continued pain

EXAM:
MRI thoracic and LUMBAR SPINE WITHOUT CONTRAST
TECHNIQUE: Multiplanar, multisequence MR imaging of the thoracic and lumbar
spine was performed. No intravenous contrast was administered.

[Series 3: T2 · sagittal · 4.0mm · 1.09mm/px · 6 of 16 slices shown (1 of 2)]
[im 1/16]
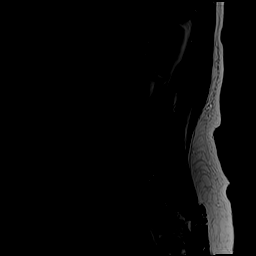
[im 4/16]
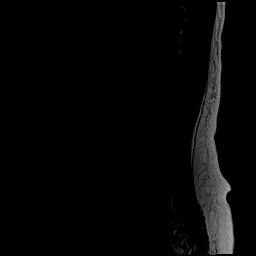
[im 7/16]
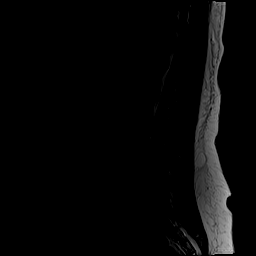
[im 10/16]
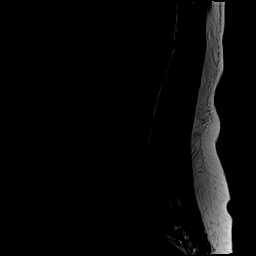
[im 13/16]
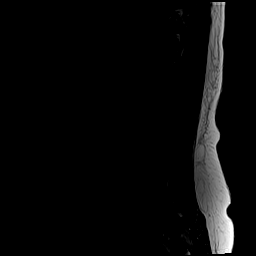
[im 16/16]
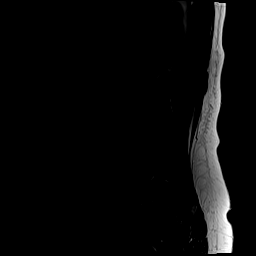

[Series 5: T1 · sagittal · 4.0mm · 1.09mm/px · 6 of 16 slices shown (1 of 2)]
[im 1/16]
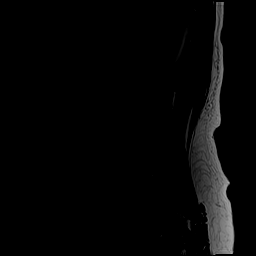
[im 4/16]
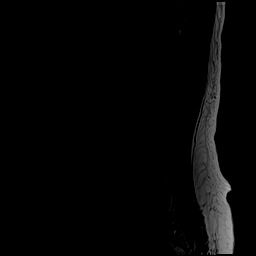
[im 7/16]
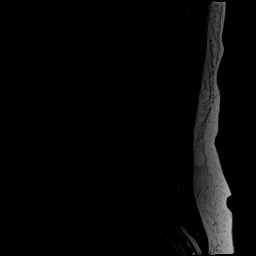
[im 10/16]
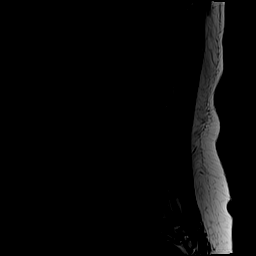
[im 13/16]
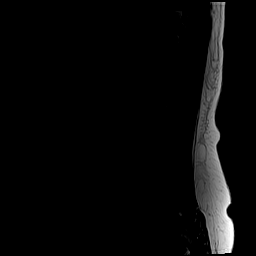
[im 16/16]
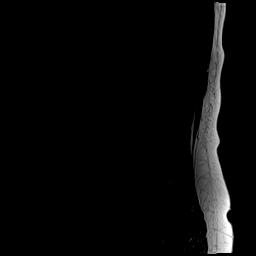

[Series 6: T2 · axial · 4.0mm · 0.39mm/px · z∈[-309,-83]mm · 9 of 43 slices shown (2 of 2)]
[im 1/43]
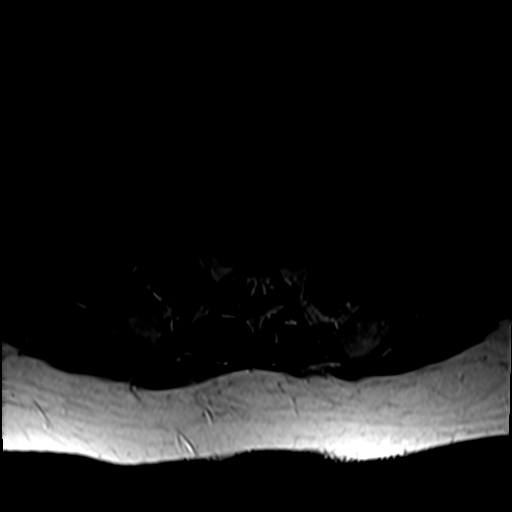
[im 7/43]
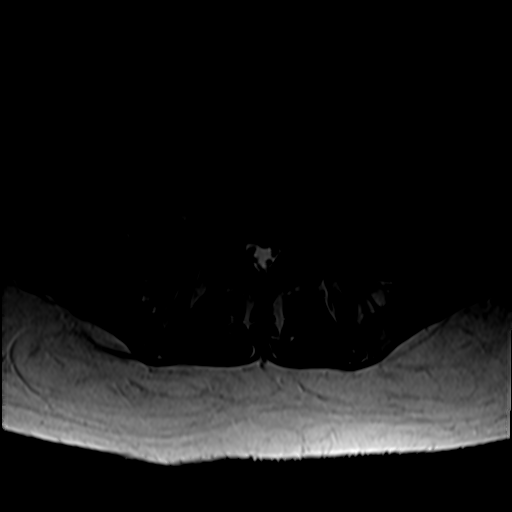
[im 13/43]
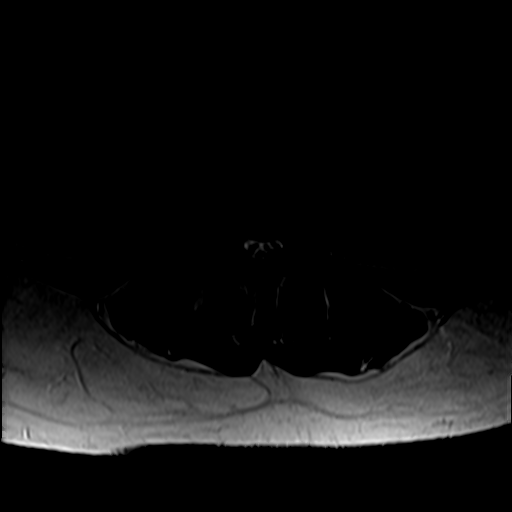
[im 19/43]
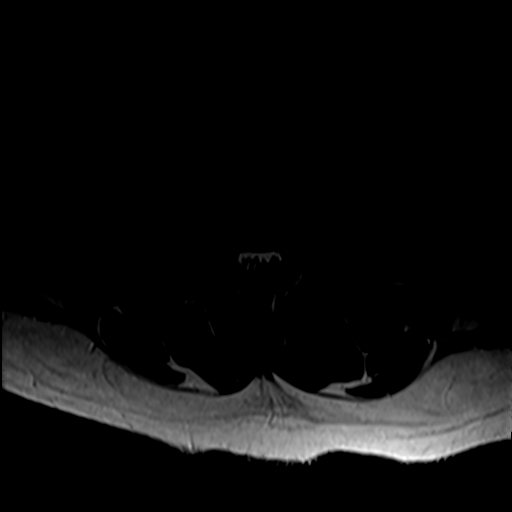
[im 22/43]
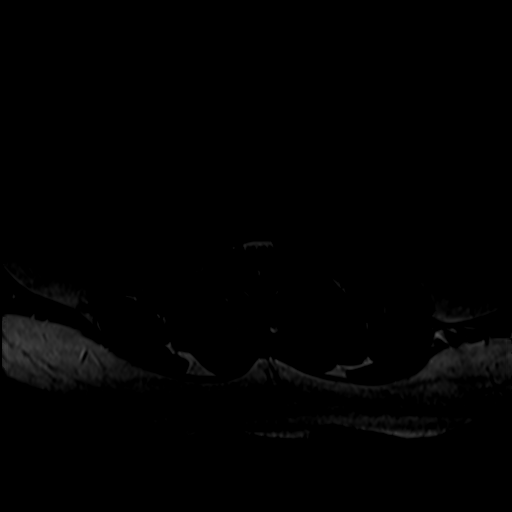
[im 25/43]
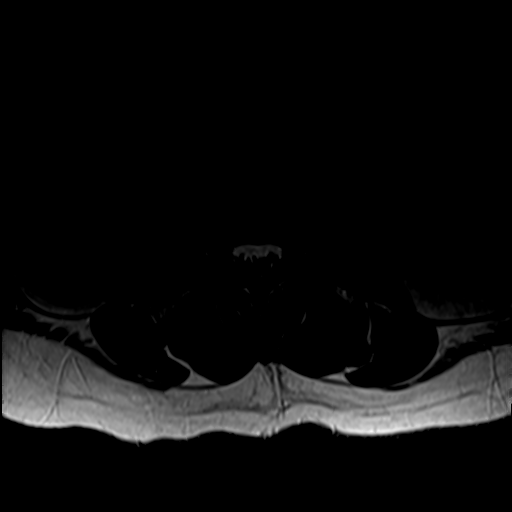
[im 31/43]
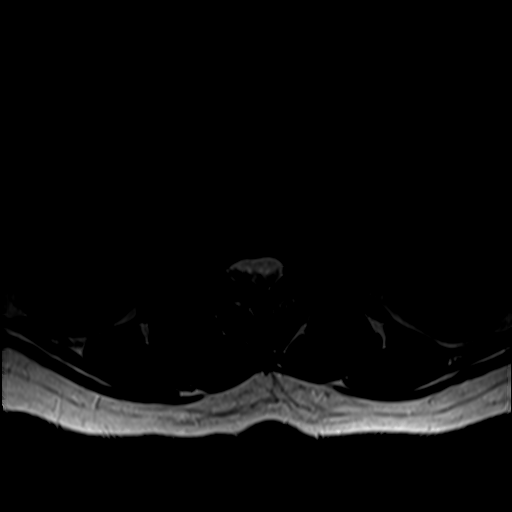
[im 37/43]
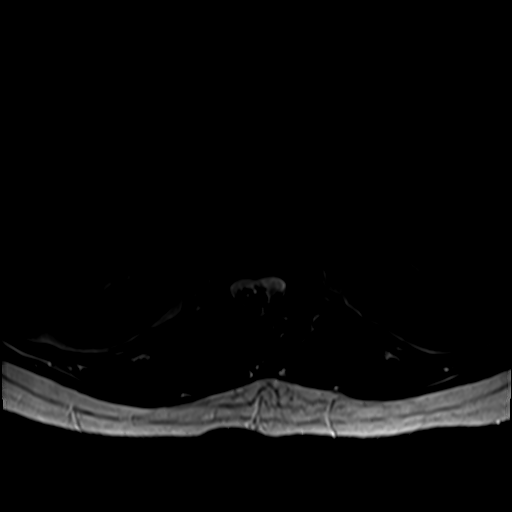
[im 43/43]
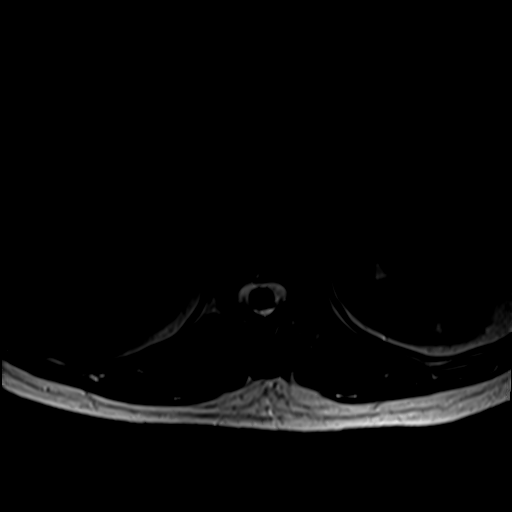

[Series 7: T1 · axial · 4.0mm · 0.39mm/px · z∈[-309,-112]mm · 4 of 43 slices shown (2 of 2)]
[im 1/43]
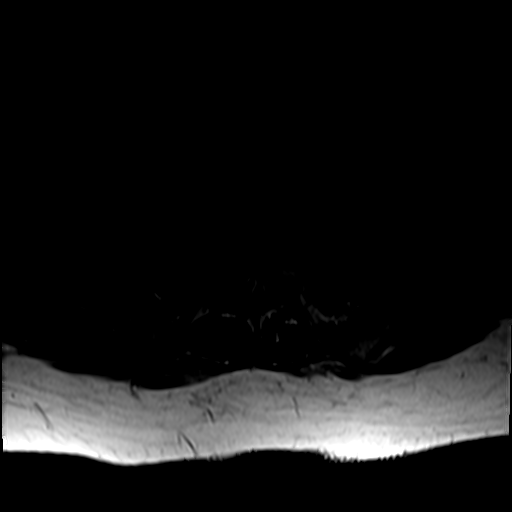
[im 7/43]
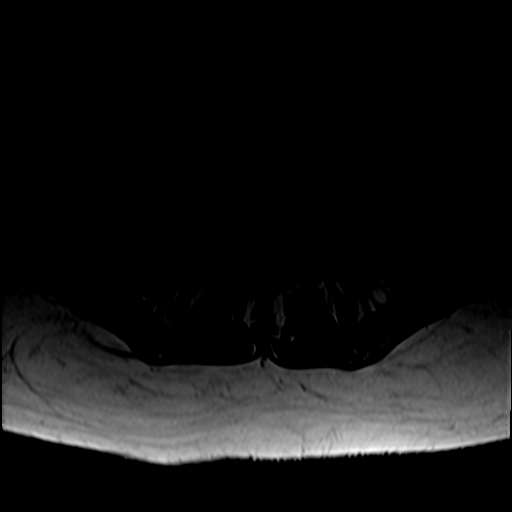
[im 22/43]
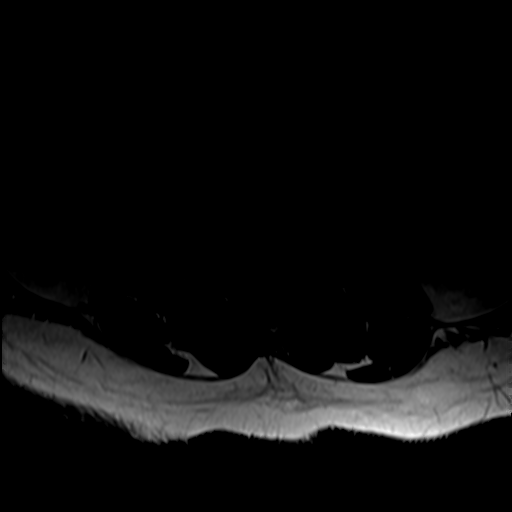
[im 37/43]
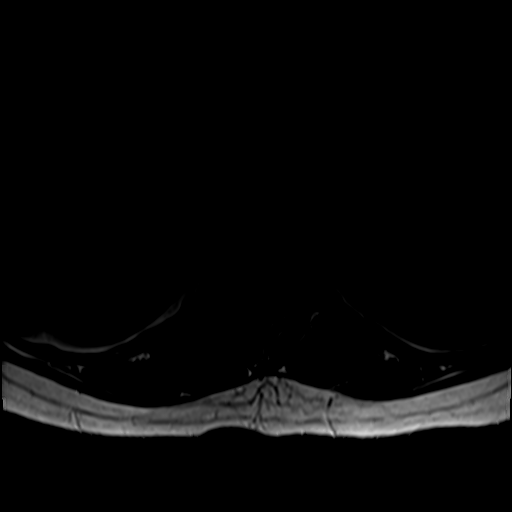

[25 of 48 positions shown; findings below may reference images not displayed]

FINDINGS: Alignment: There is a slight focal kyphosis centered at the T12
vertebral body.

Vertebrae: A healed fracture deformity seen at the posterosuperior
cortex of the T12 vertebral body with buckling of the cortex. There
is approximately 25% loss in anterior vertebral body height. No
acute fracture, marrow edema, or pathologic infiltration.

Cord: Normal in signal and morphology.

Paraspinal and other soft tissues: Normal appearance to the
paraspinal soft tissues and retroperitoneum.

Disc levels:

T1-T2: No significant canal or neural foraminal narrowing

T2-T3: No significant canal or neural foraminal narrowing

T3-T4: No significant canal or neural foraminal narrowing

T4-T5: No significant canal or neural foraminal narrowing

T5-T6: No significant canal or neural foraminal narrowing

T6-T7: No significant canal or neural foraminal narrowing

T7-T8: No significant canal or neural foraminal narrowing

T8-T9: No significant canal or neural foraminal narrowing

T9-T10: No significant canal or neural foraminal narrowing

T10-T11: No significant canal or neural foraminal narrowing

T11-T12: Due to buckling of the posterosuperior cortex of T12 there
is a tiny focal central disc protrusion which causes mild central
canal stenosis. There is also facet arthrosis which causes mild
bilateral neural foraminal narrowing.

Lumbar spine:

Segmentation: There are 5 non-rib bearing lumbar type vertebral
bodies with the last intervertebral disc space labeled as L5-S1.

Alignment:  Normal

Vertebrae: There is a healed fracture deformity of the
posterosuperior cortex of the T12 vertebral body. No fracture,
marrow edema,or pathologic marrow infiltration. There is a T1/T2
bright osseous lesion within the L4 vertebral body, likely
intraosseous hemangioma.

Conus medullaris and cauda equina: Conus extends to the T12-L1
level. Conus and cauda equina appear normal.

Paraspinal and other soft tissues: The paraspinal soft tissues and
visualized retroperitoneal structures are unremarkable. The
sacroiliac joints are intact.

Disc levels:

T12-L1: There is a minimal broad-based disc bulge and facet
arthrosis which causes mild bilateral neural foraminal narrowing and
mild central canal narrowing.

L1-L2:   No significant canal or neural foraminal narrowing.

L2-L3: There is a minimal broad-based disc bulge and facet arthrosis
which causes mild bilateral neural foraminal narrowing and no
central canal stenosis.

L3-L4: There is a minimal broad-based disc bulge and facet arthrosis
which causes mild bilateral neural foraminal narrowing, however no
significant canal stenosis.

L4-L5: There is a broad-based disc bulge and facet arthrosis which
causes mild bilateral neural foraminal narrowing and moderate
central canal stenosis

L5-S1: There is a broad-based disc bulge with a left paracentral
disc protrusion which contacts and impinges the descending left S1
nerve root. There is a posterior annular fissure also noted. Facet
arthrosis is seen which causes moderate bilateral neural foraminal
narrowing. There is mild central canal stenosis.
IMPRESSION: 1. Healed fracture deformity of the T12 vertebral body with 25% loss
in anterior vertebral body height and buckling of the
posterosuperior cortex. This causes mild central canal stenosis and
bilateral neural foraminal narrowing at T11-T12.
2. Lumbar spine spondylosis most notable at L5-S1 with a left
paracentral disc protrusion and annular fissure which contacts and
impinges the descending left S1 nerve root. There is moderate
bilateral neural foraminal narrowing at this level.
3. also at L4-L5 there is a broad-based disc bulge which causes
moderate central canal stenosis.

## 2019-06-16 ENCOUNTER — Other Ambulatory Visit: Payer: Self-pay

## 2019-06-16 DIAGNOSIS — R7303 Prediabetes: Secondary | ICD-10-CM

## 2019-06-16 DIAGNOSIS — F411 Generalized anxiety disorder: Secondary | ICD-10-CM

## 2019-06-17 DIAGNOSIS — H538 Other visual disturbances: Secondary | ICD-10-CM | POA: Diagnosis not present

## 2019-06-17 DIAGNOSIS — H02829 Cysts of unspecified eye, unspecified eyelid: Secondary | ICD-10-CM | POA: Diagnosis not present

## 2019-06-17 NOTE — BH Specialist Note (Signed)
Referring Provider: Kerin Salen, DO Date of Referral:04/22/2019 Primary Reason for Referral: stress reaction to PD dx, h/o anxiety Location of Visit: Individual, office visit Suicide/Homicide Risk: Pt denies risk  Assessment Patient presents for follow-up today with LCSW following with new diagnosis of Parkinson's Disease by referring provider Dr. Lurena Joiner Tat. Pt presents h/o anxiety and continued sx of stress reaction regarding new dx of PD >1 mo ago.  LCSW provided supportive counseling and psychoeducation as pt discussed adherence to mirapex. Of note, pt continues to experience significant sx of anxiety, difficulty sleeping, and pt reports difficulty with memory and may benefit from psychiatric medication management. LCSW consulted with referring provider who recommends referral to psychiatry. Pt also interested in nutrition services as recent labs for glucose were elevated though a1c normal. LCSW provided pt education re same-day glucose testing and average reading provided by a1c, diagnostics for pre-diabetes/diabetes and encourages diet & exercise changes to lower glucose levels. Will ask MA to place referral to nutrition.   LCSW provided pt education re importance of medication management, exercise, and self-care toinfluencethe disease course. Pt has continues to attend PD  education zoom groups led by LCSW and has completed [employer]. Again  discussed availability of individual counseling sessions as needed to address the adjustment of living with chronic disease of Parkinson's. Pt responded receptively to patient education today and supportive counseling today.   Brief Interventions provided today in session 1. psychoeducation 2. Supportive counseling   Plan 1. Continue Mirapex as directed   2. Engage in referral to nutrition & psychiatry 3. Attend [employer] class  Behavioral Health treatment recommendations communicated to referring provider and pt  states agreement with plan. LCSW will remain available for future consultation.

## 2019-06-24 DIAGNOSIS — H5213 Myopia, bilateral: Secondary | ICD-10-CM | POA: Diagnosis not present

## 2019-07-13 ENCOUNTER — Encounter: Payer: Medicaid Other | Attending: Neurology | Admitting: Registered"

## 2019-07-13 ENCOUNTER — Other Ambulatory Visit: Payer: Self-pay

## 2019-07-13 DIAGNOSIS — Z713 Dietary counseling and surveillance: Secondary | ICD-10-CM | POA: Diagnosis not present

## 2019-07-13 NOTE — Patient Instructions (Signed)
Consider working on your sleep habits, use handout for ideas Aim to eat balanced meals and snacks, use hand out for sames Consider paying attention to how you feel when you are wanting sweet bread, if you are tired try taking a nap. Guided meditation may help with sleep. Consider finding ways to help you remember to drink more water such as an app or alarm on your phone.

## 2019-07-13 NOTE — Progress Notes (Signed)
[facility] interpreter present for visit. Patient speaks and reads some English.  Medical Nutrition Therapy  Appt Start Time: 1620   End Time: 1725  Primary concerns today: needs to improve diet  Referral diagnosis: pre-diabetes (by mistake), patient requested appointment for help to improve her nutritional intake. Preferred learning style: no preference indicated Learning readiness: change in progress   NUTRITION ASSESSMENT   Anthropometrics  Not assessed   Clinical Medical Hx: Parkinsons, acid reflux Medications: mirapex Labs: Nov 2020 A1c 5.5% Notable Signs/Symptoms: Parkinson's related hand tremor.    Lifestyle & Dietary Hx Patient states 7 months ago fell off ladder and broke 12 vertebrate, him and arm still sore but mostly recovered. Pt reports that she is concerned about her balance. RD   Estimated daily fluid intake: 16 oz water, coffee, coke Supplements: melatonin 6 mg Sleep: 3 hours, has always had some insomnia, but with PD is every night. Reports some nights does not sleep at all. Stress / self-care: not assessed Current average weekly physical activity: PD  24-Hr Dietary Recall First Meal: sweet bread, coffee, Herbalife shake Snack:  Second Meal: burgers OR gorditta (fried) Snack:  Third Meal:  fatty pork, corn, chili OR tamales Snack: sweet bread Beverages: water, soda, juice  Estimated Energy Needs Calories: 1600   NUTRITION DIAGNOSIS  Inadequate fiber intake related to limited servings of vegetables and whole grains as evidenced by dietary recall   NUTRITION INTERVENTION  Nutrition education (E-1) on the following topics:  . Role of sleep in health and strategies for better sleep . Balanced eating and lean protein sources . Fluid Intake  Handouts Provided Include   MyPlate Planner  Sleep Hygiene  [place], [place]in [place])  Learning Style & Readiness for Change Teaching method utilized: Visual & Auditory  Demonstrated degree of  understanding via: Teach Back  Barriers to learning/adherence to lifestyle change: food preferences, anxiety related to lack of sleep and frustration in trying things that do not work  Goals Established by [employer] working on your sleep habits, use handout for ideas Aim to eat balanced meals and snacks, use hand out for sames Consider paying attention to how you feel when you are wanting sweet bread, if you are tired try taking a nap. Guided meditation may help with sleep. Consider finding ways to help you remember to drink more water such as an app or alarm on your phone.  MONITORING & EVALUATION Dietary intake, weekly physical activity, and sleep in 4-6 weeks.  RD's Notes for Next Visit  . Check patient's preference for follow-up focus   Next Steps  Patient is to pick one area to start working on. RD suggested sleep.

## 2019-07-14 DIAGNOSIS — M5416 Radiculopathy, lumbar region: Secondary | ICD-10-CM | POA: Diagnosis not present

## 2019-07-14 DIAGNOSIS — S43421A Sprain of right rotator cuff capsule, initial encounter: Secondary | ICD-10-CM | POA: Diagnosis not present

## 2019-07-14 DIAGNOSIS — Z6827 Body mass index (BMI) 27.0-27.9, adult: Secondary | ICD-10-CM | POA: Diagnosis not present

## 2019-07-21 ENCOUNTER — Telehealth: Payer: Self-pay | Admitting: Clinical

## 2019-07-21 NOTE — Telephone Encounter (Signed)
LCSW spoke briefly with pt's daughter. Pt continues to experience trouble with memory and executive functioning. Daughter attempting to help pt organize and keep track of medical appointments and self-management tasks. Discussed work arounds to assist with organization efforts.

## 2019-07-31 ENCOUNTER — Ambulatory Visit: Payer: Medicaid Other | Attending: Internal Medicine

## 2019-07-31 DIAGNOSIS — Z23 Encounter for immunization: Secondary | ICD-10-CM

## 2019-07-31 NOTE — Progress Notes (Signed)
   Covid-19 Vaccination Clinic  Name:  Marissa Garcia    MRN: [id number] DOB: [birth year]  07/31/2019  Marissa Garcia was observed post Covid-19 immunization for 30 minutes based on pre-vaccination screening without incident. She was provided with Vaccine Information Sheet and instruction to access the V-Safe system.   Marissa Garcia was instructed to call 911 with any severe reactions post vaccine: Marland Kitchen Difficulty breathing  . Swelling of face and throat  . A fast heartbeat  . A bad rash all over body  . Dizziness and weakness   Immunizations Administered    Name Date Dose VIS Date Route   Pfizer COVID-19 Vaccine 07/31/2019  5:09 PM 0.3 mL 04/23/2019 Intramuscular   Manufacturer: [employer], [employer]   Lot: [id number]   NDC: [address]-7

## 2019-07-31 NOTE — Progress Notes (Signed)
   Covid-19 Vaccination Clinic  Name:  Marissa Garcia    MRN: [id number] DOB: [birth year]  07/31/2019  Ms. Janik was observed post Covid-19 immunization for 30 minutes based on pre-vaccination screening .  During the observation period, she experienced an adverse reaction with the following symptoms:  difficulty breathing and tingling around face and mouth  Assessment : Time of assessment .   Actions taken:  VS taken, BP- 129/62, P- 80, O2- 100% at 521 pm , recheck at 0531pm, BP- 109/61, P- 77, O2- 100%, 545pm, BP - 105/69, P- 72, O2- 100%.    Medications administered: Famotidine (Pepcid) 10 mg by mouth. Time: np. Administered by NP.Benadryl 25mg  1 tab PO x1 by NP, and given water.  Disposition: Reports no further symptoms of adverse reaction after observation for 30 minutes. Discharged home.   Immunizations Administered    Name Date Dose VIS Date Route   Pfizer COVID-19 Vaccine 07/31/2019  5:09 PM 0.3 mL 04/23/2019 Intramuscular   Manufacturer: 14/03/2019, [employer]   Lot: [id number]   NDC: [id number]

## 2019-07-31 NOTE — Progress Notes (Signed)
   Covid-19 Vaccination Clinic  Name:  Marissa Garcia    MRN: [id number] DOB: May 28, 1965  07/31/2019  Marissa Garcia was observed post Covid-19 immunization for 30 minutes based on pre-vaccination screening .  During the observation period, she experienced an adverse reaction with the following symptoms:  difficulty breathing.  Assessment : Time of assessment  Alert and oriented and Anxious.  Actions taken:  Pt vital signs were taken and observed for additional 15 mins  Medications administered: Pt was given Benadryl 25mg  and Famotidine 20mg   Disposition: Reports no further symptoms of adverse reaction after observation for 30 minutes. Discharged home.   Immunizations Administered    Name Date Dose VIS Date Route   Pfizer COVID-19 Vaccine 07/31/2019  5:09 PM 0.3 mL 04/23/2019 Intramuscular   Manufacturer: 08/02/2019, 14/03/2019   Lot: [employer]   NDC: [employer]

## 2019-08-05 DIAGNOSIS — Z6827 Body mass index (BMI) 27.0-27.9, adult: Secondary | ICD-10-CM | POA: Diagnosis not present

## 2019-08-05 DIAGNOSIS — M7541 Impingement syndrome of right shoulder: Secondary | ICD-10-CM | POA: Diagnosis not present

## 2019-08-05 DIAGNOSIS — M48062 Spinal stenosis, lumbar region with neurogenic claudication: Secondary | ICD-10-CM | POA: Diagnosis not present

## 2019-08-05 DIAGNOSIS — G8929 Other chronic pain: Secondary | ICD-10-CM | POA: Diagnosis not present

## 2019-08-05 DIAGNOSIS — M542 Cervicalgia: Secondary | ICD-10-CM | POA: Diagnosis not present

## 2019-08-09 ENCOUNTER — Other Ambulatory Visit: Payer: Self-pay

## 2019-08-09 ENCOUNTER — Encounter: Payer: Self-pay | Admitting: Family Medicine

## 2019-08-09 ENCOUNTER — Ambulatory Visit: Payer: Medicaid Other | Attending: Family Medicine | Admitting: Family Medicine

## 2019-08-09 DIAGNOSIS — T50Z95A Adverse effect of other vaccines and biological substances, initial encounter: Secondary | ICD-10-CM | POA: Diagnosis not present

## 2019-08-09 DIAGNOSIS — R42 Dizziness and giddiness: Secondary | ICD-10-CM | POA: Diagnosis not present

## 2019-08-09 NOTE — Progress Notes (Signed)
Virtual Visit via Telephone Note  I connected with Freddi Che on 08/09/19 at  4:10 PM EDT by telephone and verified that I am speaking with the correct person using two identifiers.   I discussed the limitations, risks, security and privacy concerns of performing an evaluation and management service by telephone and the availability of in person appointments. I also discussed with the patient that there may be a patient responsible charge related to this service. The patient expressed understanding and agreed to proceed.  Patient Location: Home Provider Location: [facility] Office Others participating in call: call initiated by Emilio Aspen, RMA who then obtained a Spanish speaking interpreter through [facility] interpretation services to assist with language barrier  History of Present Illness:          54 year old female who reports that on July 31, 2019 she received the [employer] COVID-19 vaccine in [place], [place] through the clinic that she thinks was run by [employer].  She states that as soon as she was given the vaccination, she started to feel cold inside and felt as if her body was shutting down/feeling as if her blood pressure was dropping.  She reports that her blood pressure was not actually checked during this time.  She started to feel as if she would faint when she stood up to be walked from the chair where she received the immunization over to the observation area.  She reports that she felt well prior to receiving the immunization and she had eaten a few hours before going to have the immunization.  Patient reports that she was evaluated by a doctor at the immunization clinic and was told that she would need to have her second immunization at her primary care doctor's office or at the hospital where she could be observed more closely.  She reports that her symptoms did resolve shortly after the initial immunization.  She is scheduled to have her second  immunization on April 10.  She would like to know where she can have the second immunization done as she believes that she is currently scheduled to have the immunization done at the facility where she received her first immunization.  She denies any current issues with headache or dizziness, no shortness of breath or cough, no loss of sense of taste or smell, no rash, no fever or chills.  She feels as if she is back at baseline.  Past Medical History:  Diagnosis Date  . Burst fracture of T12 vertebra ([place]) 10/13/2018  . Constipation   . Headache   . Hypercholesteremia     Past Surgical History:  Procedure Laterality Date  . CESAREAN SECTION      Family History  Problem Relation Age of Onset  . Hypertension Father   . Hyperlipidemia Father   . Hyperlipidemia Mother   . Parkinson's disease Neg Hx     Social History   Tobacco Use  . Smoking status: Never Smoker  . Smokeless tobacco: Never Used  Substance Use Topics  . Alcohol use: No  . Drug use: No     Allergies  Allergen Reactions  . Propofol        Observations/Objective: No vital signs or physical exam conducted as visit was done via telephone  Assessment and Plan: 1. Adverse effect of vaccine, initial encounter Patient reports that after receiving her initial [place] COVID-19 dose on July 31, 2019 that she started to feel cold inside and lightheaded as if she might pass out.  She reports being advised to have her next immunization in her doctor's office or the hospital where she could be more closely monitored.  Currently, we did not administer the COVID-19 vaccine here in the office.  Message has been sent to triage nurse and RN case manager to see if they can find information on another place such as the Covid clinic where patient may be able to have COVID-19 immunization second dose where she may be more closely monitored.  Patient has been asked to call back to the office if she has not received a call back regarding  the immunization by Wednesday afternoon.  On review of chart, since her last visit here secondary to complaint of upper extremity tremor, she has been evaluated by neurology and diagnosed with Parkinson's for which she is now on medication.  Not sure if her Parkinson's or the medication for treatment may have contributed to her adverse reaction to her first dose of COVID-19 vaccine.  She was advised to remain well-hydrated prior to receiving her second dose of vaccination and make sure that she is eaten within an hour to an hour and a half of her immunization.  Follow Up Instructions:Return for Immunization follow-up; please call back this Wednesday afternoon if you have not been contacted.    I discussed the assessment and treatment plan with the patient. The patient was provided an opportunity to ask questions and all were answered. The patient agreed with the plan and demonstrated an understanding of the instructions.   The patient was advised to call back or seek an in-person evaluation if the symptoms worsen or if the condition fails to improve as anticipated.  I provided 17 minutes of non-face-to-face time during this encounter.   Cain Saupe, MD

## 2019-08-09 NOTE — Progress Notes (Signed)
To talk about her covid vaccine due to needing advise as to where she should go for her 2nd covid vaccine due to being told to see her PCP because her bp dropped.

## 2019-08-11 ENCOUNTER — Telehealth: Payer: Self-pay

## 2019-08-11 DIAGNOSIS — H5201 Hypermetropia, right eye: Secondary | ICD-10-CM | POA: Diagnosis not present

## 2019-08-11 NOTE — Telephone Encounter (Signed)
As we discussed, patient should not take her second dose based on CDC recommendations following her reaction to the first dose as she made patient aware during your phone conversation

## 2019-08-11 NOTE — Telephone Encounter (Signed)
Entry transfer from saff messages 08/09/2019 Dr Jillyn Hidden spoke with pt confirmed no reactions ever in the past immunizations/ stated she is skeptical to take her second shot at this time.The clinic advised to run it with her provider . She experienced difficulty breathing, palpitation, feel cold all over her body and fainting sensation. Eustace Pen not aware of close monitoring places to receive vaccine. Will check tomorrow with Dr Delford Field as well.  08/10/2019 Hi Dr Jillyn Hidden, I checked with Dr Delford Field too to see if any close monitoring clinics available for possible vaccination. He is not aware of any. Tough he suggested if any severe reactions CDC guideline should be followed and possible no second dose given.    Spoke to patient explained the above/ stated she is afraid to take her second vaccine/ Instruct her to follow strict CDC guidelines/ Instruct pt in case she decide otherwise and wants to proceed with the vaccine to make sure when making appt she notify scheduling staff and the medical personal at the vaccination site of the prior COVID 19 vaccination allergic reaction. Verbalized understanding / Message given in Spanish

## 2019-08-16 ENCOUNTER — Ambulatory Visit: Payer: Medicaid Other | Admitting: Registered"

## 2019-08-17 NOTE — Telephone Encounter (Signed)
As instructed by Dr. Jillyn Hidden message given to patient / Verbalized undersatanding   "As we discussed, patient should not take her second dose based on CDC recommendations following her reaction to the first dose as she made patient aware during your phone conversation"

## 2019-08-18 ENCOUNTER — Encounter: Payer: Medicaid Other | Attending: Neurology | Admitting: Registered"

## 2019-08-18 ENCOUNTER — Ambulatory Visit: Payer: Medicaid Other | Admitting: Family Medicine

## 2019-08-18 ENCOUNTER — Other Ambulatory Visit: Payer: Self-pay

## 2019-08-18 DIAGNOSIS — Z713 Dietary counseling and surveillance: Secondary | ICD-10-CM | POA: Diagnosis not present

## 2019-08-18 NOTE — Progress Notes (Signed)
Patient speaks and reads some English and she requested Spanish interpreter services be taken off Epic.  Medical Nutrition Therapy  Follow-up Appt Start Time: 1630   End Time: 1700  Primary concerns today: Pt states still not sleeping well Preferred learning style: no preference indicated Learning readiness: change in progress  NUTRITION ASSESSMENT   Anthropometrics  Not assessed   Clinical Medical Hx: Parkinsons, acid reflux Medications: mirapex Labs: Nov 2020 A1c 5.5% Notable Signs/Symptoms: Parkinson's related hand tremor.    Lifestyle & Dietary  Pt states she had bad reaction to vaccination, thinks it is because she is not sleeping well and is not drinking enough water.  Pt reports her daughter is helping her use an app to reminder her to drink every hour.  Estimated daily fluid intake: 16 oz water, coffee, coke Supplements: for sleep has stopped melatonin, not working and takes tylenol PM  Sleep: 2-3 hours, does not want to get up to drink when thirsty if she thinks she will have a hard time going back to sleep.  Stress / self-care: not assessed Current average weekly physical activity:   24-Hr Dietary Recall First Meal: sweet bread (less than before), fruit, nuts, Herbalife shake OR yogurt & fruit Snack:  Second Meal: chicken, rice OR soup with corn, chili, pork Snack:  Third Meal: tilapia, french fries, lettuce Snack: rice with milk Beverages: more water, soda, less juice, pedialite  Estimated Energy Needs Calories: 1600   NUTRITION DIAGNOSIS  Inadequate fiber intake related to limited servings of vegetables and whole grains as evidenced by dietary recall   NUTRITION INTERVENTION  Nutrition education (E-1) on the following topics:  . Water intake needed to prevent dehydration and thirst at night . Vegetable preparation . Appropriate use of naps  Handouts Provided Include   MyPlate Planner   Learning Style & Readiness for Change Teaching method  utilized: Visual & Auditory  Demonstrated degree of understanding via: Teach Back  Barriers to learning/adherence to lifestyle change: food preferences  Goals Established by Pt Drink water during the day so you are not so thirsty at night Use app to help remind you Drink more water when you take medicine. A short nap in the afternoon may help you feel better and not affect your ability to sleep at night. Include more vegetables in your diet and continue cutting back sweet bread. A simple way to cook broccoli: A little bit of water in a microwave safe dish. Add some fresh broccoli cut into bite-size pieces. Cook about 3 min. Season with a little salt and butter.   MONITORING & EVALUATION Dietary intake, weekly physical activity, and sleep in 4-6 weeks.  RD's Notes for Next Visit  . More ideas for easy vegetable intake

## 2019-08-18 NOTE — Patient Instructions (Addendum)
Drink water during the day so you are not so thirsty at night Use app to help remind you Drink more water when you take medicine. A short nap in the afternoon may help you feel better and not affect your ability to sleep at night. Include more vegetables in your diet and continue cutting back sweet bread. A simple way to cook broccoli: A little bit of water in a microwave safe dish. Add some fresh broccoli cut into bite-size pieces. Cook about 3 min. Season with a little salt and butter.

## 2019-08-21 ENCOUNTER — Telehealth: Payer: Self-pay | Admitting: Internal Medicine

## 2019-08-21 ENCOUNTER — Ambulatory Visit: Payer: Medicaid Other | Attending: Internal Medicine

## 2019-08-21 DIAGNOSIS — Z23 Encounter for immunization: Secondary | ICD-10-CM

## 2019-08-21 NOTE — Telephone Encounter (Signed)
Pt presents today for COVID vaccine clinic for second vaccine. While completing pre-screening, pt verbalized she had an adverse reaction to her first immunization of Pfizer COVID vaccine. C/o feeling lightheaded and cold sensation of the extremities. Further added that she discussed her adverse reaction with her PCP. According to BOTH pt and the entry found within Epic from PCP, pt was advised of the following:  - Remain hydrated - Eat 1 hour prior to admin of vaccine - Schedule follow up immediately after receiving second dose of COVID vaccine  Discussed above with pharmacist as well as reviewing PCP note following initial adverse reaction. Agreed pt may receive vaccine but MUST remain on site for observation for 30 minutes post injection. Also reviewed with RN my discussion had with pharmacist as well as pharmacist's recommendation. Renaldo Reel, RN also reviewed PCP note and is aware of PCP's POC.  Pt consented to receive vaccine and also agreed to remain on site for 30 minutes post injection. In addition, pt further added that she pre-medicated today with 10mg  of Benadryl. Injection given IM in R deltoid today. Denies any adverse reactions or discomfort at the time of injection but will remain on site for additional 30 minutes for further evaluation. 

## 2019-08-21 NOTE — Progress Notes (Signed)
   Covid-19 Vaccination Clinic  Name:  Marissa Garcia    MRN: [id number] DOB: 05-04-1966  08/21/2019  Ms. Pedraza was observed post Covid-19 immunization for 30 minutes based on pre-vaccination screening without incident. She was provided with Vaccine Information Sheet and instruction to access the V-Safe system. Medical Interpreter used.  Ms. Dena was instructed to call 911 with any severe reactions post vaccine: Marland Kitchen Difficulty breathing  . Swelling of face and throat  . A fast heartbeat  . A bad rash all over body  . Dizziness and weakness   Immunizations Administered    Name Date Dose VIS Date Route   Pfizer COVID-19 Vaccine 08/21/2019  5:33 PM 0.3 mL 04/23/2019 Intramuscular   Manufacturer: [employer], [employer]   Lot: [id number]   NDC: [address]-8

## 2019-08-25 ENCOUNTER — Other Ambulatory Visit (HOSPITAL_COMMUNITY)
Admission: RE | Admit: 2019-08-25 | Discharge: 2019-08-25 | Disposition: A | Discharge status: Home / Self Care | Payer: Medicaid Other | Source: Ambulatory Visit | Attending: Family Medicine | Admitting: Family Medicine

## 2019-08-25 ENCOUNTER — Ambulatory Visit: Payer: Medicaid Other | Attending: Family Medicine | Admitting: Physician Assistant

## 2019-08-25 ENCOUNTER — Other Ambulatory Visit: Payer: Self-pay

## 2019-08-25 VITALS — BP 108/66 | HR 68 | Temp 98.5°F | Ht <= 58 in | Wt 139.0 lb

## 2019-08-25 DIAGNOSIS — N898 Other specified noninflammatory disorders of vagina: Secondary | ICD-10-CM | POA: Insufficient documentation

## 2019-08-25 DIAGNOSIS — Z780 Asymptomatic menopausal state: Secondary | ICD-10-CM | POA: Diagnosis not present

## 2019-08-25 DIAGNOSIS — Z124 Encounter for screening for malignant neoplasm of cervix: Secondary | ICD-10-CM | POA: Diagnosis present

## 2019-08-25 DIAGNOSIS — E7800 Pure hypercholesterolemia, unspecified: Secondary | ICD-10-CM | POA: Diagnosis not present

## 2019-08-25 DIAGNOSIS — R739 Hyperglycemia, unspecified: Secondary | ICD-10-CM | POA: Diagnosis not present

## 2019-08-25 DIAGNOSIS — N950 Postmenopausal bleeding: Secondary | ICD-10-CM | POA: Diagnosis not present

## 2019-08-25 DIAGNOSIS — Z79899 Other long term (current) drug therapy: Secondary | ICD-10-CM | POA: Insufficient documentation

## 2019-08-25 NOTE — Progress Notes (Signed)
PAP

## 2019-08-25 NOTE — Progress Notes (Signed)
Patient ID: Marissa Garcia, female   DOB: Nov 20, 1965, 54 y.o.   MRN: [id number]   Marissa Garcia, is a 54 y.o. female  DJS:[id number]  YIF:[id number]  DOB - [birth year]  Subjective:  Chief Complaint and HPI: Marissa Garcia is a 54 y.o. female here today for pap and vaginal odor.  No UTI s/sx.  She does c/o vaginal odor for about 2-3 years.  Periods stopped about 3 years ago but then she had a period in November.    ROS:   Constitutional:  No f/c, No night sweats, No unexplained weight loss. EENT:  No vision changes, No blurry vision, No hearing changes. No mouth, throat, or ear problems.  Respiratory: No cough, No SOB Cardiac: No CP, no palpitations GI:  No abd pain, No N/V/D. GU: No Urinary s/sx Musculoskeletal: No joint pain Neuro: No headache, no dizziness, no motor weakness.  Skin: No rash Endocrine:  No polydipsia. No polyuria.  Psych: Denies SI/HI  No problems updated.  ALLERGIES: Allergies  Allergen Reactions  . Propofol     PAST MEDICAL HISTORY: Past Medical History:  Diagnosis Date  . Burst fracture of T12 vertebra ([facility]) 10/13/2018  . Constipation   . Headache   . Hypercholesteremia     MEDICATIONS AT HOME: Prior to Admission medications   Medication Sig Start Date End Date Taking? Authorizing Provider  Boswellia-Glucosamine-Vit D (GLUCOSAMINE COMPLEX PO) Take by mouth.    [provider]  COLLAGEN PO Take by mouth.    [provider]  diphenhydramine-acetaminophen (TYLENOL PM) 25-500 MG TABS tablet Take 1 tablet by mouth at bedtime as needed (for sleep).    [provider]  L-Lysine HCl 500 MG CAPS Take by mouth.    [provider]  Melatonin 1 MG TABS Take by mouth.    [provider]  pramipexole (MIRAPEX) 0.125 MG tablet 1 tablet three times per day for a week, then 2 po three times per day for a week Patient not taking: Reported on 08/25/2019 04/22/19   Tat, Octaviano Batty, DO  pramipexole (MIRAPEX) 0.5 MG tablet Take 1  tablet (0.5 mg total) by mouth 3 (three) times daily. 04/22/19   Tat, Octaviano Batty, DO  propranolol (INDERAL) 20 MG tablet Take 1 tablet (20 mg total) by mouth 3 (three) times daily. 02/22/19 02/22/19  Wieters, Hallie C, PA-C  zolpidem (AMBIEN) 5 MG tablet Take 1 tablet (5 mg total) by mouth at bedtime as needed for sleep. Patient not taking: Reported on 07/24/2017 03/19/16 02/22/19  Dessa Phi, MD     Objective:  EXAM:   Vitals:   08/25/19 1413  BP: 108/66  Pulse: 68  Temp: 98.5 F (36.9 C)  TempSrc: Oral  SpO2: 98%  Weight: 139 lb (63 kg)  Height: 4\' 10"  (1.473 m)    General appearance : A&OX3. NAD. Non-toxic-appearing HEENT: Atraumatic and Normocephalic.  PERRLA. EOM intact.  Neck: supple, no JVD. No cervical lymphadenopathy. No thyromegaly Chest/Lungs:  Breathing-non-labored, Good air entry bilaterally, breath sounds normal without rales, rhonchi, or wheezing  CVS: S1 S2 regular, no murmurs, gallops, rubs  GU:  External genitalia WNL.  Speculum inserted.  Cervix slightly friable and spotted with pap scraping.  Swabs taken.  Bimanual unrmearkable Extremities: Bilateral Lower Ext shows no edema, both legs are warm to touch with = pulse throughout Neurology:  CN II-XII grossly intact, Non focal.   Psych:  TP linear. J/I WNL. Normal speech. Appropriate eye contact and affect.  Skin:  No Rash  Data  Review Lab Results  Component Value Date   HGBA1C 5.5 03/25/2019   HGBA1C 5.4 01/25/2016   HGBA1C  02/16/2010    5.6 (NOTE)                                                                       According to the ADA Clinical Practice Recommendations for 2011, when HbA1c is used as a screening test:   >=6.5%   Diagnostic of Diabetes Mellitus           (if abnormal result  is confirmed)  5.7-6.4%   Increased risk of developing Diabetes Mellitus  References:Diagnosis and Classification of Diabetes Mellitus,Diabetes SFKC,1275,17(GYFVC 1):S62-S69 and Standards of Medical Care in          Diabetes - 2011,Diabetes Care,2011,34  (Suppl 1):S11-S61.     Assessment & Plan   1. Vaginal odor Will treat if needed - Cervicovaginal ancillary only  2. Hyperglycemia I have had a lengthy discussion and provided education about insulin resistance and the intake of too much sugar/refined carbohydrates.  I have advised the patient to work at a goal of eliminating sugary drinks, candy, desserts, sweets, refined sugars, processed foods, and white carbohydrates.  The patient expresses understanding.  - HgB A1c  3. Post-menopausal bleeding - TSH - FSH/LH - Ambulatory referral to Gynecology  4. Screening for cervical cancer - Cytology - PAP([place])   Patient have been counseled extensively about nutrition and exercise  Return in about 6 months (around 02/24/2020) for see PCP.  The patient was given clear instructions to go to ER or return to medical center if symptoms don't improve, worsen or new problems develop. The patient verbalized understanding. The patient was told to call to get lab results if they haven't heard anything in the next week.     Freeman Caldron, PA-C [facility] and [place], [place]   08/25/2019, 2:44 PM

## 2019-08-26 LAB — CERVICOVAGINAL ANCILLARY ONLY
Bacterial Vaginitis (gardnerella): NEGATIVE
Candida Glabrata: NEGATIVE
Candida Vaginitis: NEGATIVE
Chlamydia: NEGATIVE
Comment: NEGATIVE
Comment: NEGATIVE
Comment: NEGATIVE
Comment: NEGATIVE
Comment: NEGATIVE
Comment: NORMAL
Neisseria Gonorrhea: NEGATIVE
Trichomonas: NEGATIVE

## 2019-08-26 LAB — FSH/LH
FSH: 84 m[IU]/mL
LH: 40.7 m[IU]/mL

## 2019-08-26 LAB — TSH: TSH: 1.06 u[IU]/mL (ref 0.450–4.500)

## 2019-08-27 LAB — CYTOLOGY - PAP
Comment: NEGATIVE
Diagnosis: NEGATIVE
High risk HPV: NEGATIVE

## 2019-09-03 NOTE — Progress Notes (Signed)
Due to language barrier, an interpreter was used.  Interpreter name or [employer]pre[employer] ID [phone], Reuel Boom   Reason for encounter- lab results.   Pt name and DOB verified. Patient aware of results and result note per [facility]

## 2019-09-10 NOTE — Progress Notes (Signed)
Assessment/Plan:   1.  Parkinsons Disease, diagnosed December, 2020  -Continue pramipexole, 0.5 mg 3 times per day but move it closer together so she is taking it at 8am/noon/5pm  -discussed importance of increasing exercise   -pt working on living will and discussed issues with that.  She met with my Education officer, [employer] today  2.  GAD  -still a big factor in perception of disease  -continue to work with counseling   Subjective:   Marissa Garcia was seen today in follow up for Parkinsons disease, newly diagnosed last visit.  My previous records were reviewed prior to todays visit as well as outside records available to me.  She was started on pramipexole last visit.  She reports that she isn't sure if med helps or not.  No compulsive behavior or sleep attacks.  She did see our Education officer, museum initially for adjustment counseling and anxiety.  Social work noted that patient was not taking the pramipexole correctly, and we called her to talk to her about that.  She does state that she adjusted that.  Pt denies falls.  Pt denies lightheadedness, near syncope.  No hallucinations.  She is not exercising.    Current prescribed movement disorder medications: Pramipexole, 0.5 mg 3 times per day (9am/3pm/9pm is when she is taking it)   PREVIOUS MEDICATIONS: Mirapex  ALLERGIES:   Allergies  Allergen Reactions  . Propofol     CURRENT MEDICATIONS:  Outpatient Encounter Medications as of 09/13/2019  Medication Sig  . diphenhydramine-acetaminophen (TYLENOL PM) 25-500 MG TABS tablet Take 1 tablet by mouth at bedtime as needed (for sleep).  . pramipexole (MIRAPEX) 0.125 MG tablet 1 tablet three times per day for a week, then 2 po three times per day for a week (Patient not taking: Reported on 08/25/2019)  . pramipexole (MIRAPEX) 0.5 MG tablet Take 1 tablet (0.5 mg total) by mouth 3 (three) times daily.  . [DISCONTINUED] Boswellia-Glucosamine-Vit D (GLUCOSAMINE COMPLEX PO) Take by mouth.  . [DISCONTINUED]  COLLAGEN PO Take by mouth.  . [DISCONTINUED] L-Lysine HCl 500 MG CAPS Take by mouth.  . [DISCONTINUED] Melatonin 1 MG TABS Take by mouth.  . [DISCONTINUED] propranolol (INDERAL) 20 MG tablet Take 1 tablet (20 mg total) by mouth 3 (three) times daily.  . [DISCONTINUED] zolpidem (AMBIEN) 5 MG tablet Take 1 tablet (5 mg total) by mouth at bedtime as needed for sleep. (Patient not taking: Reported on 07/24/2017)   No facility-administered encounter medications on file as of 09/13/2019.    Objective:   PHYSICAL EXAMINATION:    VITALS:   Vitals:   09/13/19 0828  BP: 101/63  Pulse: (!) 57  SpO2: 98%  Weight: 136 lb (61.7 kg)  Height: 5' (1.524 m)    GEN:  The patient appears stated age and is in NAD.  She is anxious and sometimes tearful HEENT:  Normocephalic, atraumatic.  The mucous membranes are moist. The superficial temporal arteries are without ropiness or tenderness. CV:  Loletha Grayer.  regular Lungs:  CTAB Neck/HEME:  There are no carotid bruits bilaterally.  She did NOT take her pramipexole yet today.    Neurological examination:  Orientation: The patient is alert and oriented x3. Cranial nerves: There is good facial symmetry with facial hypomimia. The speech is fluent and clear. Soft palate rises symmetrically and there is no tongue deviation. Hearing is intact to conversational tone. Sensation: Sensation is intact to light touch throughout Motor: Strength is at least antigravity x4.  Movement examination: Tone: There  is normal tone in the UE/LE Abnormal movements: there is LUE rest tremor and occasional LLE rest tremor Coordination:  There is  decremation with RAM's, with any form of RAMS, including alternating supination and pronation of the forearm, hand opening and closing, finger taps, heel taps and toe taps on the L Gait and Station: The patient has no difficulty arising out of a deep-seated chair without the use of the hands. The patient's stride length is good with decreased  arm swing bilaterally.    I have reviewed and interpreted the following labs independently    Chemistry      Component Value Date/Time   NA 139 03/25/2019 1553   K 4.0 03/25/2019 1553   CL 103 03/25/2019 1553   CO2 23 03/25/2019 1553   BUN 8 03/25/2019 1553   CREATININE 0.51 (L) 03/25/2019 1553   CREATININE 0.64 08/24/2015 1134      Component Value Date/Time   CALCIUM 9.2 03/25/2019 1553   ALKPHOS 56 10/13/2018 2239   AST 32 10/13/2018 2239   ALT 23 10/13/2018 2239   BILITOT 0.6 10/13/2018 2239       Lab Results  Component Value Date   WBC 11.0 (H) 10/13/2018   HGB 12.2 10/13/2018   HCT 36.2 10/13/2018   MCV 92.3 10/13/2018   PLT 284 10/13/2018    Lab Results  Component Value Date   TSH 1.060 08/25/2019     Total time spent on today's visit was 30 minutes, including both face-to-face time and nonface-to-face time.  Time included that spent on review of records (prior notes available to me/labs/imaging if pertinent), discussing treatment and goals, answering patient's questions and coordinating care.  Cc:  Antony Blackbird, MD

## 2019-09-13 ENCOUNTER — Ambulatory Visit (INDEPENDENT_AMBULATORY_CARE_PROVIDER_SITE_OTHER): Payer: Medicaid Other | Admitting: Neurology

## 2019-09-13 ENCOUNTER — Encounter: Payer: Self-pay | Admitting: Neurology

## 2019-09-13 ENCOUNTER — Other Ambulatory Visit: Payer: Self-pay

## 2019-09-13 VITALS — BP 101/63 | HR 57 | Ht 60.0 in | Wt 136.0 lb

## 2019-09-13 DIAGNOSIS — F411 Generalized anxiety disorder: Secondary | ICD-10-CM | POA: Diagnosis not present

## 2019-09-13 DIAGNOSIS — G20 Parkinson's disease: Secondary | ICD-10-CM | POA: Diagnosis not present

## 2019-09-13 NOTE — Patient Instructions (Addendum)
1.  Take your pramipexole, 0.5 mg, at 8am/noon/5pm 2.  Increase exercise as tolerated.  This is the key to slowing Parkinsons Disease   The physicians and staff at [facility] are committed to providing excellent care. You may receive a survey requesting feedback about your experience at our office. We strive to receive "very good" responses to the survey questions. If you feel that your experience would prevent you from giving the office a "very good " response, please contact our office to try to remedy the situation. We may be reached at [phone]. Thank you for taking the time out of your busy day to complete the survey.

## 2019-10-07 ENCOUNTER — Other Ambulatory Visit: Payer: Self-pay

## 2019-10-07 ENCOUNTER — Encounter: Payer: Self-pay | Admitting: Obstetrics & Gynecology

## 2019-10-07 ENCOUNTER — Ambulatory Visit (INDEPENDENT_AMBULATORY_CARE_PROVIDER_SITE_OTHER): Payer: Medicaid Other | Admitting: Obstetrics & Gynecology

## 2019-10-07 VITALS — BP 114/79 | HR 274 | Ht 61.0 in | Wt 134.0 lb

## 2019-10-07 DIAGNOSIS — N951 Menopausal and female climacteric states: Secondary | ICD-10-CM | POA: Diagnosis not present

## 2019-10-07 DIAGNOSIS — N952 Postmenopausal atrophic vaginitis: Secondary | ICD-10-CM | POA: Diagnosis not present

## 2019-10-07 DIAGNOSIS — N950 Postmenopausal bleeding: Secondary | ICD-10-CM

## 2019-10-07 NOTE — Progress Notes (Signed)
Patient ID: Marissa Garcia, female   DOB: [birth year], 54 y.o.   MRN: [id number]  Chief Complaint  Patient presents with  . Gynecologic Exam    HPI Marissa Garcia is a 54 y.o. female.  [id number]  Patient's last menstrual period was 03/19/2016. Patient is referred for evaluation of episode of postmenopausal bleeding 03/2019, which was heavy for 4-5 days. Previously no menses for 4 years. No longer having VMS. She has been noted to have atrophic vaginitis. HPI  Past Medical History:  Diagnosis Date  . Burst fracture of T12 vertebra (HCC) 10/13/2018  . Complication of anesthesia   . Constipation   . Headache   . Hypercholesteremia     Past Surgical History:  Procedure Laterality Date  . CESAREAN SECTION      Family History  Problem Relation Age of Onset  . Hypertension Father   . Hyperlipidemia Father   . Hyperlipidemia Mother   . Parkinson's disease Neg Hx     Social History Social History   Tobacco Use  . Smoking status: Never Smoker  . Smokeless tobacco: Never Used  Substance Use Topics  . Alcohol use: No  . Drug use: No    Allergies  Allergen Reactions  . Propofol     Current Outpatient Medications  Medication Sig Dispense Refill  . diphenhydramine-acetaminophen (TYLENOL PM) 25-500 MG TABS tablet Take 1 tablet by mouth at bedtime as needed (for sleep).    . pramipexole (MIRAPEX) 0.5 MG tablet Take 1 tablet (0.5 mg total) by mouth 3 (three) times daily. 270 tablet 1  . pramipexole (MIRAPEX) 0.125 MG tablet 1 tablet three times per day for a week, then 2 po three times per day for a week (Patient not taking: Reported on 08/25/2019) 63 tablet 0   No current facility-administered medications for this visit.    Review of Systems Review of Systems  Constitutional: Negative.   Respiratory: Negative.   Gastrointestinal: Negative.   Endocrine: Negative.   Genitourinary: Positive for menstrual problem. Negative for pelvic pain, vaginal bleeding and vaginal discharge.     Blood pressure 114/79, pulse (!) 274, height 5\' 1"  (1.549 m), weight 60.8 kg, last menstrual period 03/19/2016.  Physical Exam Physical Exam Constitutional:      Appearance: Normal appearance.  Cardiovascular:     Pulses: Normal pulses.     Comments: Recorded pulse rate is spuriously high Pulmonary:     Effort: Pulmonary effort is normal.  Genitourinary:    General: Normal vulva.     Vagina: No vaginal discharge.     Comments: Vaginal atrophy, cervix normal no D/C or blood, uterus normal no masses Neurological:     Mental Status: She is alert.     Data Reviewed Pap normal  Assessment Atrophic vulvovaginitis  Perimenopausal  Postmenopausal bleeding - Plan: 13/11/2015 PELVIC COMPLETE WITH TRANSVAGINAL    Plan Pelvic US ordered and f/u to determine if she will need biopsy    [place] 10/07/2019, 10:16 AM

## 2019-10-07 NOTE — Patient Instructions (Signed)
Postmenopausal Bleeding  Postmenopausal bleeding is any bleeding that occurs after menopause. Menopause is when a woman's period stops. Any type of bleeding after menopause should be checked by your doctor. Treatment will depend on the cause. Follow these instructions at home:  Pay attention to any changes in your symptoms.  Avoid using tampons and douches as told by your doctor.  Change your pads regularly.  Get regular pelvic exams and Pap tests.  Take iron pills as told by your doctor.  Take over-the-counter and prescription medicines only as told by your doctor.  Keep all follow-up visits as told by your doctor. This is important. Contact a doctor if:  Your bleeding lasts for more than 1 week.  You have pain in your belly (abdomen).  You have bleeding during or after sex.  You have bleeding that happens more often than every 3 weeks. Get help right away if:  You have fever, chills, headache, dizziness, muscle aches, or bleeding.  You have very bad pain with bleeding.  You have clumps of blood (blood clots) coming from your vagina.  You have a lot of bleeding, and: ? You use more than 1 pad an hour. ? This kind of bleeding has never happened before.  You feel like you are going to pass out (faint). Summary  Any type of bleeding after menopause should be checked by your doctor.  Pay attention to any changes in your symptoms.  Keep all follow-up visits as told by your doctor. This information is not intended to replace advice given to you by your health care provider. Make sure you discuss any questions you have with your health care provider. Document Revised: 07/16/2018 Document Reviewed: 06/04/2016 Elsevier Patient Education  2020 Elsevier Inc.  

## 2019-10-13 ENCOUNTER — Ambulatory Visit: Payer: Medicaid Other | Admitting: Registered"

## 2019-10-19 ENCOUNTER — Ambulatory Visit: Admission: RE | Admit: 2019-10-19 | Payer: Medicaid Other | Source: Ambulatory Visit

## 2019-11-11 DIAGNOSIS — Z419 Encounter for procedure for purposes other than remedying health state, unspecified: Secondary | ICD-10-CM | POA: Diagnosis not present

## 2019-11-25 ENCOUNTER — Other Ambulatory Visit: Payer: Self-pay | Admitting: Neurology

## 2019-11-25 NOTE — Telephone Encounter (Signed)
Rx(s) sent to pharmacy electronically. Patients daughter has been notified.

## 2019-11-25 NOTE — Telephone Encounter (Signed)
Patient's daughter Merlie Noga called in to get a refill for her mother's Mirapex. They are going on vacation tomorrow and she will run out before they get back.

## 2019-12-12 DIAGNOSIS — Z419 Encounter for procedure for purposes other than remedying health state, unspecified: Secondary | ICD-10-CM | POA: Diagnosis not present

## 2020-01-12 DIAGNOSIS — Z419 Encounter for procedure for purposes other than remedying health state, unspecified: Secondary | ICD-10-CM | POA: Diagnosis not present
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
## 2020-02-03 ENCOUNTER — Encounter: Payer: Self-pay | Admitting: Neurology

## 2020-02-03 ENCOUNTER — Ambulatory Visit (INDEPENDENT_AMBULATORY_CARE_PROVIDER_SITE_OTHER): Payer: Medicaid Other | Admitting: Neurology

## 2020-02-03 ENCOUNTER — Other Ambulatory Visit: Payer: Self-pay

## 2020-02-03 VITALS — BP 106/71 | HR 62 | Ht 59.0 in | Wt 134.0 lb

## 2020-02-03 DIAGNOSIS — G20 Parkinson's disease: Secondary | ICD-10-CM

## 2020-02-03 MED ORDER — CARBIDOPA-LEVODOPA 25-100 MG PO TABS: 1.0000 | ORAL_TABLET | Freq: Three times a day (TID) | ORAL | 1 refills | Status: DC

## 2020-02-03 NOTE — Addendum Note (Signed)
Addended by: Kandice Robinsons T on: 02/03/2020 11:11 AM   Modules accepted: Orders

## 2020-02-03 NOTE — Patient Instructions (Addendum)
DECREASE pramipexole 0.5 mg, 1/2 tablet three times per day for ONE WEEK and then STOP pramipexole  Start Carbidopa Levodopa as follows:  Take 1/2 tablet three times daily, at least 30 minutes before meals (approximately 7am/11am/4pm), for one week  Then take 1/2 tablet in the morning (7am), 1/2 tablet in the afternoon (11am), 1 tablet in the evening (4pm), at least 30 minutes before meals, for one week  Then take 1/2 tablet in the morning (7am), 1 tablet in the afternoon (11am), 1 tablet in the evening (4pm), at least 30 minutes before meals, for one week  Then take 1 tablet three times daily at 7am/11am/4pm, at least 30 minutes before meals   As a reminder, carbidopa/levodopa can be taken at the same time as a carbohydrate, but we like to have you take your pill either 30 minutes before a protein source or 1 hour after as protein can interfere with carbidopa/levodopa absorption.  The physicians and staff at [facility] are committed to providing excellent care. You may receive a survey requesting feedback about your experience at our office. We strive to receive "very good" responses to the survey questions. If you feel that your experience would prevent you from giving the office a "very good " response, please contact our office to try to remedy the situation. We may be reached at [phone]. Thank you for taking the time out of your busy day to complete the survey.

## 2020-02-04 ENCOUNTER — Telehealth: Payer: Self-pay | Admitting: Neurology

## 2020-02-04 DIAGNOSIS — G20A1 Parkinson's disease without dyskinesia, without mention of fluctuations: Secondary | ICD-10-CM

## 2020-02-04 DIAGNOSIS — G20 Parkinson's disease: Secondary | ICD-10-CM

## 2020-02-04 NOTE — Telephone Encounter (Signed)
Sarah from [facility] called and said they'll need to decline this referral as their facility is not in network with the patient's insurance. Please reroute referral to another facility.

## 2020-02-04 NOTE — Telephone Encounter (Signed)
Spoke with patient and she is agreeable with [facility].   I will send over referral.

## 2020-02-04 NOTE — Telephone Encounter (Signed)
Ask patient about if she prefers [facility] (may have a wait to get it) or [facility] ([place] but will be fast to get appt if in network with insurance)

## 2020-02-04 NOTE — Telephone Encounter (Signed)
Suggestions?

## 2020-02-07 NOTE — Telephone Encounter (Signed)
Patient called back and was given message that was left on the voicemail. She voiced understanding.

## 2020-02-07 NOTE — Addendum Note (Signed)
Addended by: Kandice Robinsons T on: 02/07/2020 11:38 AM   Modules accepted: Orders

## 2020-02-07 NOTE — Telephone Encounter (Signed)
Received a call from Barrett at [facility] stating they can not accept Medicaid.   Will send [facility] referral [facility] and see if they can accept the patient.   Order has been placed.  Left detailed message informing patient that [facility] not [facility] insurance and her referral has been [facility] to to [facility]. Left phone number for patient to contact [facility] for an appt if she does not hear back from them with in the next week.

## 2020-02-11 DIAGNOSIS — Z419 Encounter for procedure for purposes other than remedying health state, unspecified: Secondary | ICD-10-CM | POA: Diagnosis not present

## 2020-02-24 ENCOUNTER — Ambulatory Visit: Payer: Medicaid Other | Admitting: Neurology

## 2020-03-13 DIAGNOSIS — Z419 Encounter for procedure for purposes other than remedying health state, unspecified: Secondary | ICD-10-CM | POA: Diagnosis not present

## 2020-03-14 ENCOUNTER — Encounter: Payer: Self-pay | Admitting: Family Medicine

## 2020-03-16 ENCOUNTER — Other Ambulatory Visit: Payer: Self-pay | Admitting: Family Medicine

## 2020-03-16 DIAGNOSIS — G20A1 Parkinson's disease without dyskinesia, without mention of fluctuations: Secondary | ICD-10-CM

## 2020-03-16 DIAGNOSIS — G20 Parkinson's disease: Secondary | ICD-10-CM

## 2020-03-16 NOTE — Progress Notes (Signed)
Patient ID: Marissa Garcia, female   DOB: 10-16-1965, 54 y.o.   MRN: [id number]   My chart message received that patient needs assistance applying for disability. I will place a social work consult so that someone may reach out to the patient and help with her questions regarding disability.

## 2020-03-23 ENCOUNTER — Telehealth: Payer: Self-pay | Admitting: Licensed Clinical Social Worker

## 2020-03-23 NOTE — Telephone Encounter (Signed)
Call placed to patient utilizing [employer] Rella Larve BM#[id number]) LCSW introduced self and explained role at [facility]. Pt was informed of [facility] referral regarding assistance with applying for disability.   Pt was informed of how to apply for disability and mailed supportive resource/information, per pt request. Pt was strongly encouraged to contact clinic with any additional questions or concerns. LCSW informed pt of partnership with Legal [employer], to assist with process, if needed.   No additional concerns noted.

## 2020-04-12 DIAGNOSIS — Z419 Encounter for procedure for purposes other than remedying health state, unspecified: Secondary | ICD-10-CM | POA: Diagnosis not present

## 2020-05-13 DIAGNOSIS — Z419 Encounter for procedure for purposes other than remedying health state, unspecified: Secondary | ICD-10-CM | POA: Diagnosis not present

## 2020-06-13 DIAGNOSIS — Z419 Encounter for procedure for purposes other than remedying health state, unspecified: Secondary | ICD-10-CM | POA: Diagnosis not present

## 2020-07-11 DIAGNOSIS — Z419 Encounter for procedure for purposes other than remedying health state, unspecified: Secondary | ICD-10-CM | POA: Diagnosis not present

## 2020-07-19 NOTE — Progress Notes (Addendum)
Assessment/Plan:   1.  Parkinsons Disease  -take carbidopa/levodopa 25/100 tid  -went to [place] for 2nd opinion and they added propranolol 40 mg tid.  She did not take that today and her BP and pulse were very low and told her that while it may help tremor, it is not worth the potential SE of syncope/near syncope.  She is to d/c that.  -they started lexapro in [place] and I agree with that and I refilled that today  2.  R shoulder pain  -sounds like R rotator cuff  -told her to f/u with PCP or ortho Subjective:   Marissa Garcia was seen today in follow up for Parkinsons disease.  My previous records were reviewed prior to todays visit as well as outside records available to me.  My PA translated for [place] today (will not be present in the future).   Weaned off of pramipexole last visit d/t EDS and levodopa was started.  She reports that she did well with it today.  Pt denies falls. No hallucinations.  Went to [place] since last visit and was placed on back on pramipexole, 1 mg.  It made her sleepy and she had trouble finding it in [place].  She was also put on propranolol.  At the beginning she found herself lightheaded.  This is better now b/c she is now taking it bid and not tid as directed.  She c/o pain on the R with lifting the arm  Current prescribed movement disorder medications: carbidopa/levodopa 25/100 tid at 7am/11am/4pm (started last visit) - taking at 9am/1pm/6pm   PREVIOUS MEDICATIONS: pramipexole (EDS)  ALLERGIES:   Allergies  Allergen Reactions  . Propofol     CURRENT MEDICATIONS:  Outpatient Encounter Medications as of 07/21/2020  Medication Sig  . BIPERIDEN HCL PO Take 2 mg by mouth 3 (three) times daily.  . carbidopa-levodopa (SINEMET IR) 25-100 MG tablet Take 1 tablet by mouth 3 (three) times daily. 7am/11am/4pm  . escitalopram (LEXAPRO) 10 MG tablet Take 10 mg by mouth daily.  . propranolol (INDERAL) 40 MG tablet Take 40 mg by mouth daily. Take 1/2 tab every 12 hours   . Melatonin 10 MG TABS Take 1 tablet by mouth daily. (Patient not taking: Reported on 07/21/2020)  . [DISCONTINUED] propranolol (INDERAL) 20 MG tablet Take 1 tablet (20 mg total) by mouth 3 (three) times daily.  . [DISCONTINUED] zolpidem (AMBIEN) 5 MG tablet Take 1 tablet (5 mg total) by mouth at bedtime as needed for sleep. (Patient not taking: Reported on 07/24/2017)   No facility-administered encounter medications on file as of 07/21/2020.    Objective:   PHYSICAL EXAMINATION:    VITALS:   Vitals:   07/21/20 0841  BP: 100/60  Pulse: (!) 54  SpO2: 99%  Weight: 142 lb 3.2 oz (64.5 kg)  Height: 4\' 11"  (1.499 m)    GEN:  The patient appears stated age and is in NAD. HEENT:  Normocephalic, atraumatic.  The mucous membranes are moist. The superficial temporal arteries are without ropiness or tenderness. CV:  RRR Lungs:  CTAB Neck/HEME:  There are no carotid bruits bilaterally.  Neurological examination:  Orientation: The patient is alert and oriented x3. Cranial nerves: There is good facial symmetry with min facial hypomimia. The speech is fluent and clear. Soft palate rises symmetrically and there is no tongue deviation. Hearing is intact to conversational tone. Sensation: Sensation is intact to light touch throughout Motor: Strength is at least antigravity x4.  Movement examination: Tone:  There is nl tone in the UE/LE Abnormal movements: there is LUE rest tremor (hasn't taken any med today) Coordination:  There is mild  decremation with RAM's, on the L Gait and Station: The patient has no difficulty arising out of a deep-seated chair without the use of the hands. The patient's stride length is good.    I have reviewed and interpreted the following labs independently    Chemistry      Component Value Date/Time   NA 139 03/25/2019 1553   K 4.0 03/25/2019 1553   CL 103 03/25/2019 1553   CO2 23 03/25/2019 1553   BUN 8 03/25/2019 1553   CREATININE 0.51 (L) 03/25/2019 1553    CREATININE 0.64 08/24/2015 1134      Component Value Date/Time   CALCIUM 9.2 03/25/2019 1553   ALKPHOS 56 10/13/2018 2239   AST 32 10/13/2018 2239   ALT 23 10/13/2018 2239   BILITOT 0.6 10/13/2018 2239       Lab Results  Component Value Date   WBC 11.0 (H) 10/13/2018   HGB 12.2 10/13/2018   HCT 36.2 10/13/2018   MCV 92.3 10/13/2018   PLT 284 10/13/2018    Lab Results  Component Value Date   TSH 1.060 08/25/2019     Total time spent on today's visit was 30 minutes, including both face-to-face time and nonface-to-face time.  Time included that spent on review of records (prior notes available to me/labs/imaging if pertinent), discussing treatment and goals, answering patient's questions and coordinating care.  Cc:  Cain Saupe, MD (Inactive)

## 2020-07-21 ENCOUNTER — Encounter: Payer: Self-pay | Admitting: Neurology

## 2020-07-21 ENCOUNTER — Other Ambulatory Visit: Payer: Self-pay

## 2020-07-21 ENCOUNTER — Ambulatory Visit: Payer: Medicaid Other | Admitting: Neurology

## 2020-07-21 VITALS — BP 100/60 | HR 54 | Ht 59.0 in | Wt 142.2 lb

## 2020-07-21 DIAGNOSIS — G20 Parkinson's disease: Secondary | ICD-10-CM | POA: Diagnosis not present

## 2020-07-21 DIAGNOSIS — F411 Generalized anxiety disorder: Secondary | ICD-10-CM

## 2020-07-21 MED ORDER — ESCITALOPRAM OXALATE 10 MG PO TABS: 10.0000 mg | ORAL_TABLET | Freq: Every day | ORAL | 1 refills | Status: DC

## 2020-07-21 NOTE — Patient Instructions (Signed)
Online Resources for Power over Parkinson's Group March 2022  . Local [facility] Online Groups  o Power over Parkinson's Group :   - Power Over Parkinson's Patient Education Group will be Wednesday, March 9th at 2pm via Zoom.   - Upcoming Power over Parkinson's Meetings:  2nd Wednesdays of the month at 2 pm:       April 13th, May 11th - Contact Amy Marriott at [email or web address] if interested in participating in this online group o Parkinson's Care Partners Group:    3rd Mondays, Contact Sarah Chambers o Atypical Parkinsonian Patient Group:   4th Wednesdays, Contact Sarah Chambers o If you are interested in participating in these online groups with Sarah, please contact her directly for how to join those meetings.  Her contact information is [email or web address].  She will send you a link to join the Zoom meeting.  (Please note that Sarah Chambers , MSW, LCSW, has resigned her position at [facility] [employer], but will continue to lead the online groups temporarily)  . Parkinson Foundation:  www.parkinson.org o PD Health at Home continues:  Mindfulness Mondays, Expert Briefing Tuesdays, Wellness Wednesdays, Take Time Thursdays, Fitness Fridays -Listings for March 2022 are on the website o Upcoming Webinar:  Conversations about Complementary Therapies and PD.  Wednesday, March 2nd @ 1 pm o Upcoming Webinar:  Can We Put the Brakes on PD Progression?  Wednesday, April 6th @ 1 pm o Register for expert briefings (webinars) at [email or web address] o  Please check out their website to sign up for emails and see their full online offerings  . Michael J Fox Foundation:  www.michaeljfox.org  o Upcoming Webinar:   Trouble Sleeping?  What to Know about Acting out Dreams and other Sleep Issues.  Thursday, March 17th @ 12 noon o Check out additional information on their website to see their full online offerings  . [employer] Phinney Foundation:  www.davisphinneyfoundation.org o Upcoming  Webinar:  Women and Parkinson's.  Tuesday, March 8th at 2 pm o Care Partner Monthly Meetup.  With Connie Carpenter Phinney.  First Tuesday of each month, 2 pm o Check out additional information to Live Well Today on their website  . Parkinson and Movement Disorders (PMD) Alliance:  www.pmdalliance.org o [employer]:  Online Education Events o Sign up for emails, which are sent weekly to give you updates on programming and online offerings   . Parkinson's Association of [place]:  www.parkinsonassociation.org o Information on online support groups, education events, and online exercises including Yoga, Parkinson's exercises and more-LOTS of information on links to PD resources and online events o Virtual Support Group through Parkinson's Association of [place]; next one is scheduled for Wednesday, August 16, 2020 at 2 pm. (These are typically scheduled for the 1st Wednesday of the month at 2 pm).  Visit website for details.  . Additional links for movement activities: o PWR! Moves Classes at [facility] Exercise Room HAVE RESUMED!  Wednesdays 10 and 11 am.  Contact Amy Marriott, PT [email or web address] or [phone] if interested o Here is a link to the PWR!Moves classes on Zoom from [place] Parkinson's Foundation - Daily Mon-Sat at 10:00. Via Zoom, FREE and open to all.  There is also a link below via Facebook if you use that platform. - https://www.parkinsonsmi.org/mpf-programs/exercise-and-movement-activities - https://www.facebook.com/ParkinsonsMI.org/posts/pwr-moves-exercise-class-parkinson-wellness-recovery-online-with-angee-ludwa-pt-/10156827878021813/ o Parkinson's Wellness Recovery (PWR! Moves)  www.pwr4life.org - Info on the PWR! Virtual Experience:  You will have access to our expertise through self-assessment, guided plans that start with the PD-specific fundamentals, educational content, tips, Q&A with an expert,   and a growing library of PD-specific  pre-recorded and live exercise classes of varying types and intensity - both physical and cognitive! If that is not enough, we offer 1:1 wellness consultations (in-person or virtual) to personalize your PWR! Virtual Experience.  - Check out the PWR! Move of the month on the Parkinson Wellness Recovery website:  https://www.pwr4life.org/pwr-move-of-the-month-4/ o Parkinson Foundation Fitness Fridays:  - As part of the PD Health @ Home program, this free video series focuses each week on one aspect of fitness designed to support people living with Parkinson's.  These weekly videos highlight the Parkinson Foundation recent fitness guidelines for people with Parkinson's disease. -  www.parkinson.org/understanding-parkinsons/coronavirus/PD-health-at-home/Fitness-Fridays o Dance for PD website is offering free, live-stream classes throughout the week, as well as links to digital library of classes:  https://danceforparkinsons.org/ o Dance for Parkinson's Class:  [employer] [place].  Free offering for people with Parkinson's and care partners; virtual class.  o For more information, contact [phone] or email Magalli Morana at [email or web address] o Virtual dance and Pilates for Parkinson's classes: Click on the Community Tab> Parkinson's Movement Initiative Tab.  To register for classes and for more information, visit www.americandancefestival.org and click the "community" tab.     o [employer] Parkinson's Cycling Classes  - [facility]: 1pm on Fridays-Live classes at [employer] (Contact Beth McKinney at [email or web address] or [phone]) - [facility] [employer]: Virtual Classes Mondays and Thursdays (contact Priscilla at [email or web address] or [phone])   o [place] [employer] - Three levels of classes are offered Tuesdays and Thursdays:  10:30 am,  12 noon & 1:45 pm at [facility].  - Active Stretching with Maria, New Class starting in  March, on Fridays - To observe a class or for  more information, call [phone] or email [email or web address] . [facility]: o Online Caregiver Education Opportunities:  www.well-springsolutions.org/caregiver-education/caregiver-support-group.  You may also contact Jodi Kolada at [email or web address] or [phone].   o Virtual Caregiver Retreat, Monday, February 21st, 3-5 pm o Powerful Tools for Caregivers, 6-wk series for caregivers, in-person, Thursdays March 10, 17, 24, 31 and April 7, 14, from 10:30-12:30 at [facility] Conference Room, [address]  Contact Jodi Kolada (see above) to register o [facility] Navigator:  Just1Navigator program, a free service to help individuals and families through the journey of determining care for older adults.  The "Navigator" is a social worker, Nicole Reynolds, who will speak with a prospective client and/or loved ones to provide an assessment of the situation and a set of recommendations for a personalized care plan -- all free of charge, and whether [facility] offers the needed service or not. If the need is not a service we provide, we are well-connected with reputable programs in town that we can refer you to.  www.well-springsolutions.org or to speak with the Navigator, call [phone].  

## 2020-08-11 DIAGNOSIS — Z419 Encounter for procedure for purposes other than remedying health state, unspecified: Secondary | ICD-10-CM | POA: Diagnosis not present

## 2020-08-15 ENCOUNTER — Other Ambulatory Visit: Payer: Self-pay

## 2020-08-15 ENCOUNTER — Other Ambulatory Visit: Payer: Self-pay | Admitting: Neurology

## 2020-08-15 NOTE — Telephone Encounter (Signed)
Rx(s) sent to pharmacy electronically.  

## 2020-09-10 DIAGNOSIS — Z419 Encounter for procedure for purposes other than remedying health state, unspecified: Secondary | ICD-10-CM | POA: Diagnosis not present

## 2020-10-11 DIAGNOSIS — Z419 Encounter for procedure for purposes other than remedying health state, unspecified: Secondary | ICD-10-CM | POA: Diagnosis not present

## 2020-11-10 DIAGNOSIS — Z419 Encounter for procedure for purposes other than remedying health state, unspecified: Secondary | ICD-10-CM | POA: Diagnosis not present

## 2020-11-20 ENCOUNTER — Other Ambulatory Visit: Payer: Self-pay | Admitting: Neurology

## 2020-11-21 ENCOUNTER — Other Ambulatory Visit: Payer: Self-pay

## 2020-12-11 DIAGNOSIS — Z419 Encounter for procedure for purposes other than remedying health state, unspecified: Secondary | ICD-10-CM | POA: Diagnosis not present

## 2020-12-28 ENCOUNTER — Other Ambulatory Visit: Payer: Self-pay | Admitting: Neurology

## 2021-01-01 NOTE — Progress Notes (Signed)
Assessment/Plan:   1.  Parkinsons Disease  -Continue carbidopa/levodopa 25/100 tid  -Add rotigotine patch, 2 mg daily for 1 week and then increase to 4 mg daily.  She has previously failed pramipexole.  Discussed risk, benefits, side effects.  Samples provided.  -She is currently taking biperiden from [place].  This drug is anticholinergic.  She is very young and can tolerate the anticholinergic effects right now.  I am not sure that that will be the case in the future.  We will need to monitor.  -discussed dbs.  Questions asked and answered to best of my ability   2.  Depression  -On escitalopram, 10 mg daily.  3.  Lightheadedness  -increase water.  Not drinking much (any) Subjective:   Marissa Garcia was seen today in follow up for Parkinsons disease.  My previous records were reviewed prior to todays visit as well as outside records available to me. Translator with patient.   Patients propranolol was discontinued last visit (started in [place]) due to the fact that her blood pressure and pulse were quite low.  Notes tremor getting a bit worse.  Noting it in right hand as well (prior only left).  On biperiden from [place] and has helped tremor some.  Also c/o memory loss.  Some lightheaded if stands up long time.  Not drinking water much.    Current prescribed movement disorder medications: carbidopa/levodopa 25/100 tid at  taking at 9am/1pm/6pm Lexapro, 10 mg daily.  PREVIOUS MEDICATIONS: pramipexole (EDS); propranolol (bradycardia and low blood pressure)  ALLERGIES:   Allergies  Allergen Reactions   Propofol     CURRENT MEDICATIONS:  Outpatient Encounter Medications as of 01/02/2021  Medication Sig   BIPERIDEN HCL PO Take 2 mg by mouth 3 (three) times daily.   carbidopa-levodopa (SINEMET IR) 25-100 MG tablet TAKE 1 TABLET BY MOUTH THREE TIMES DAILY. AT 7 AM; 11 AM; & 4 PM   escitalopram (LEXAPRO) 10 MG tablet Take 1 tablet (10 mg total) by mouth daily.   Melatonin 10 MG TABS  Take 1 tablet by mouth daily.   [DISCONTINUED] zolpidem (AMBIEN) 5 MG tablet Take 1 tablet (5 mg total) by mouth at bedtime as needed for sleep. (Patient not taking: No sig reported)   No facility-administered encounter medications on file as of 01/02/2021.    Objective:   PHYSICAL EXAMINATION:    VITALS:   Vitals:   01/02/21 1047  BP: 112/62  Pulse: 66  SpO2: 97%  Weight: 141 lb (64 kg)  Height: 4\' 9"  (1.448 m)     GEN:  The patient appears stated age and is in NAD. HEENT:  Normocephalic, atraumatic.  The mucous membranes are moist. The superficial temporal arteries are without ropiness or tenderness. CV:  RRR Lungs:  CTAB Neck/HEME:  There are no carotid bruits bilaterally.  Neurological examination:  Orientation: The patient is alert and oriented x3. Cranial nerves: There is good facial symmetry with min facial hypomimia. The speech is fluent and clear. Soft palate rises symmetrically and there is no tongue deviation. Hearing is intact to conversational tone. Sensation: Sensation is intact to light touch throughout Motor: Strength is at least antigravity x4.  Movement examination: Tone: There is nl tone in the UE/LE Abnormal movements: there is bilateral UE and LE rest tremor, mod in intensity Coordination:  There is mild  decremation with RAM's, on the L Gait and Station: The patient has no difficulty arising out of a deep-seated chair without the use of the  hands. The patient's stride length is good.    I have reviewed and interpreted the following labs independently    Chemistry      Component Value Date/Time   NA 139 03/25/2019 1553   K 4.0 03/25/2019 1553   CL 103 03/25/2019 1553   CO2 23 03/25/2019 1553   BUN 8 03/25/2019 1553   CREATININE 0.51 (L) 03/25/2019 1553   CREATININE 0.64 08/24/2015 1134      Component Value Date/Time   CALCIUM 9.2 03/25/2019 1553   ALKPHOS 56 10/13/2018 2239   AST 32 10/13/2018 2239   ALT 23 10/13/2018 2239   BILITOT 0.6  10/13/2018 2239       Lab Results  Component Value Date   WBC 11.0 (H) 10/13/2018   HGB 12.2 10/13/2018   HCT 36.2 10/13/2018   MCV 92.3 10/13/2018   PLT 284 10/13/2018    Lab Results  Component Value Date   TSH 1.060 08/25/2019     Total time spent on today's visit was 40 minutes, including both face-to-face time and nonface-to-face time.  Time included that spent on review of records (prior notes available to me/labs/imaging if pertinent), discussing treatment and goals, answering patient's questions and coordinating care.  Cc:  Cain Saupe, MD (Inactive)

## 2021-01-02 ENCOUNTER — Other Ambulatory Visit: Payer: Self-pay

## 2021-01-02 ENCOUNTER — Encounter: Payer: Self-pay | Admitting: Neurology

## 2021-01-02 ENCOUNTER — Ambulatory Visit: Payer: Medicaid Other | Admitting: Neurology

## 2021-01-02 VITALS — BP 112/62 | HR 66 | Ht <= 58 in | Wt 141.0 lb

## 2021-01-02 DIAGNOSIS — G20 Parkinson's disease: Secondary | ICD-10-CM

## 2021-01-02 MED ORDER — NEUPRO 4 MG/24HR TD PT24: MEDICATED_PATCH | TRANSDERMAL | 0 refills | Status: DC

## 2021-01-02 MED ORDER — ROTIGOTINE 4 MG/24HR TD PT24: 1.0000 | MEDICATED_PATCH | Freq: Every day | TRANSDERMAL | 1 refills | Status: DC

## 2021-01-02 MED ORDER — CARBIDOPA-LEVODOPA 25-100 MG PO TABS: 1.0000 | ORAL_TABLET | Freq: Three times a day (TID) | ORAL | 2 refills | Status: DC

## 2021-01-02 MED ORDER — NEUPRO 2 MG/24HR TD PT24: MEDICATED_PATCH | TRANSDERMAL | 0 refills | Status: DC

## 2021-01-11 DIAGNOSIS — Z419 Encounter for procedure for purposes other than remedying health state, unspecified: Secondary | ICD-10-CM | POA: Diagnosis not present

## 2021-01-17 ENCOUNTER — Telehealth: Payer: Self-pay | Admitting: Neurology

## 2021-01-17 NOTE — Telephone Encounter (Signed)
Patient stopped by the office and said she is still waiting for approval for assistance to cover her Neupro 4 MG patch.  She is out of the medication and is hoping to pick up a sample, if available.

## 2021-01-17 NOTE — Telephone Encounter (Signed)
Unable to provide patient with samples as we are out of 4 mg Neurpo.   Patient stated she is waiting on approval for assistance, but unsure what assistance she is waiting on as we do not have any Neupro Assistance forms.  I did find a website that patient can apply for online at her convenience  [email or web address].[email or web address].

## 2021-01-18 NOTE — Telephone Encounter (Signed)
F/u    Prior authorization initiated under Cover my meds for medication Neurpo 4 mg.   The patient has [place] Medicaid [employer], elapsed coverage effective date  01/12/20 to last Verified 12/11/2020 .  Call authorization phone number for verification [phone], unable to locate a member.   The patient was notified [phone] to check the status of her Medicaid and call back with information on current coverage.

## 2021-02-10 DIAGNOSIS — Z419 Encounter for procedure for purposes other than remedying health state, unspecified: Secondary | ICD-10-CM | POA: Diagnosis not present

## 2021-03-13 DIAGNOSIS — Z419 Encounter for procedure for purposes other than remedying health state, unspecified: Secondary | ICD-10-CM | POA: Diagnosis not present

## 2021-03-30 ENCOUNTER — Encounter: Payer: Self-pay | Admitting: Orthopaedic Surgery

## 2021-03-30 ENCOUNTER — Ambulatory Visit (INDEPENDENT_AMBULATORY_CARE_PROVIDER_SITE_OTHER): Payer: Medicaid Other | Admitting: Orthopaedic Surgery

## 2021-03-30 ENCOUNTER — Ambulatory Visit (INDEPENDENT_AMBULATORY_CARE_PROVIDER_SITE_OTHER): Payer: Medicaid Other

## 2021-03-30 ENCOUNTER — Other Ambulatory Visit: Payer: Self-pay

## 2021-03-30 DIAGNOSIS — M25511 Pain in right shoulder: Secondary | ICD-10-CM

## 2021-03-30 DIAGNOSIS — G8929 Other chronic pain: Secondary | ICD-10-CM

## 2021-03-30 NOTE — Progress Notes (Signed)
Office Visit Note   Patient: Marissa Garcia           Date of Birth: 18-Oct-1965           MRN: [id number] Visit Date: 03/30/2021              Requested by: No referring provider defined for this encounter. PCP: Cain Saupe, MD (Inactive)   Assessment & Plan: Visit Diagnoses:  1. Chronic right shoulder pain     Plan: Impression is chronic right shoulder pain for over 3 years following a fall from the second floor.  Based on the patient's chronic symptoms, pain and lack of range of motion, would like to go ahead and order an MRI arthrogram of the right shoulder to assess for structural abnormalities.  She will follow-up with [place] once this is been completed.  Call with concerns or questions in the meantime.  Follow-Up Instructions: Return for after MRI arhrogram right shoulder.   Orders:  Orders Placed This Encounter  Procedures   XR Shoulder Right   MR SHOULDER RIGHT W CONTRAST   Arthrogram   No orders of the defined types were placed in this encounter.     Procedures: No procedures performed   Clinical Data: No additional findings.   Subjective: Chief Complaint  Patient presents with   Right Shoulder - Pain    HPI patient is a pleasant 55 year old Spanish-speaking female who is here today with interpreter.  She is here with right shoulder pain after a fall she sustained approximately 3 years ago.  She notes that she was on the second floor fell to the first floor.  Unsure exactly how she landed but she had multiple injuries at the time of the shoulder was not worked out.  She has had pain to the deltoid since with associated weakness to the right upper extremity.  She has pain with lifting anything of weight.  She has not take NSAIDs without significant relief.  She does note that she was seen by a physician prior to this injury where right shoulder cortisone injection was performed.  She thinks she may have had an allergic reaction following the injection as she remembers  having had to be seen in the ED.  Of note, she has underlying Parkinson's disease.  Review of Systems as detailed in HPI.  All other reviewed and are negative.   Objective: Vital Signs: LMP 03/19/2016   Physical Exam well-developed well-nourished female in no acute distress.  Alert and oriented x3.  Ortho Exam right shoulder exam reveals forward flexion to approximate 160 degrees.  She can internally rotate to her back pocket.  Positive empty can.  Positive cross body abduction.  Mild tenderness to the [facility] joint.  Negative speeds negative O'Brien's testing.  4 out of 5 strength.  She is neurovascular tact distally.  Specialty Comments:  No specialty comments available.  Imaging: No results found.   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   Perimenopausal 01/25/2016   DENTAL CARIES 07/05/2010   HEADACHE 03/21/2010   ANXIETY STATE, UNSPECIFIED 02/27/2010   ACID REFLUX DISEASE 02/27/2010   WRIST PAIN, RIGHT 02/27/2010   DIZZINESS 02/27/2010   CHEST PAIN 02/15/2010   HYPERCHOLESTEROLEMIA 05/13/2008   Past Medical History:  Diagnosis Date   Burst fracture of T12 vertebra (HCC) 10/13/2018   Complication of anesthesia    Constipation  Headache    Hypercholesteremia     Family History  Problem Relation Age of Onset   Hypertension Father    Hyperlipidemia Father    Hyperlipidemia Mother    Parkinson's disease Neg Hx     Past Surgical History:  Procedure Laterality Date   CESAREAN SECTION     Social History   Occupational History   Not on file  Tobacco Use   Smoking status: Never   Smokeless tobacco: Never  Vaping Use   Vaping Use: Never used  Substance and Sexual Activity   Alcohol use: No   Drug use: No   Sexual activity: Yes    Birth control/protection: None

## 2021-04-12 DIAGNOSIS — Z419 Encounter for procedure for purposes other than remedying health state, unspecified: Secondary | ICD-10-CM | POA: Diagnosis not present

## 2021-04-19 ENCOUNTER — Encounter: Payer: Self-pay | Admitting: Neurology

## 2021-04-25 ENCOUNTER — Other Ambulatory Visit: Payer: Self-pay | Admitting: Neurology

## 2021-04-25 DIAGNOSIS — F411 Generalized anxiety disorder: Secondary | ICD-10-CM

## 2021-04-30 ENCOUNTER — Telehealth: Payer: Self-pay | Admitting: Orthopaedic Surgery

## 2021-04-30 NOTE — Telephone Encounter (Signed)
Called pt to get sch'd for post MRI after 05/16/21 with Roda Shutters

## 2021-05-13 DIAGNOSIS — Z419 Encounter for procedure for purposes other than remedying health state, unspecified: Secondary | ICD-10-CM | POA: Diagnosis not present

## 2021-05-16 ENCOUNTER — Other Ambulatory Visit: Payer: Medicaid Other

## 2021-05-25 NOTE — Progress Notes (Signed)
Assessment/Plan:   1.  Parkinsons Disease  -Increase  carbidopa/levodopa 25/100 2/1/1 - move dosing to 8am/noon/4pm  -add requip and work to 1.0 mg tid.  Discussed risks, benefits, and side effects.  -discussed with her that I don't want her going back and forth b/w here and [place] for care.  She will just maintain care here.  It has been very difficult because not all the medications are available here that are available in [place], and the care has not been consistent when going back and forth.  -discussed dbs.  Questions asked and answered to best of my ability   2.  Depression  -On escitalopram, 10 mg daily.  3.  Insomnia  -She asks about that today, and wanted to add more medication.  However, I am already making 2 other changes, including adding Requip, which can already produce daytime hypersomnolence.  I did not want to add further medication right now.  Told her that we could address this next visit. Subjective:   Marissa Garcia was seen today in follow up for Parkinsons disease.  My previous records were reviewed prior to todays visit as well as outside records available to me. Translator with patient.   We started her on rotigotine last visit but she reports that she is no longer on it because insurance issues. It helped a lot when she was on the samples.  She then went back to [place] and she was put on pramipexole again (she didn't remember she took it before).  They only had her taking it qday (and it wasn't ER version).  She isn't on it b/c she ran out about a month ago.  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  Several near falls/trips.  Current prescribed movement disorder medications: carbidopa/levodopa 25/100 tid at  taking at 9am/1pm/6pm Rotigotine patch, 4 mg daily (started last visit) Lexapro, 10 mg daily.  PREVIOUS MEDICATIONS: pramipexole (EDS); propranolol (bradycardia and low blood pressure); rotigotine patch (insurance denied -  samples helped)  ALLERGIES:   Allergies  Allergen Reactions   Propofol     CURRENT MEDICATIONS:  Outpatient Encounter Medications as of 05/29/2021  Medication Sig   BIPERIDEN HCL PO Take 2 mg by mouth 3 (three) times daily.   carbidopa-levodopa (SINEMET IR) 25-100 MG tablet Take 1 tablet by mouth 3 (three) times daily.   escitalopram (LEXAPRO) 10 MG tablet Take 1 tablet by mouth once daily   Melatonin 10 MG TABS Take 1 tablet by mouth daily.   rotigotine (NEUPRO) 2 MG/24HR Samples of this drug were given to the patient, quantity 7 patches 1 box, Lot Number [id number] Exp: 11/2020. Patient instructed to use one patch every 24 hours (Patient not taking: Reported on 05/29/2021)   rotigotine (NEUPRO) 4 MG/24HR Place 1 patch onto the skin daily. (Patient not taking: Reported on 05/29/2021)   rotigotine (NEUPRO) 4 MG/24HR Samples of this drug were given to the patient, quantity 1 box/ 7 patches (Starter pack), Lot Number [id number] Exp: 11/2020   Patient instructed to use 1 patch every 24 hours (Patient not taking: Reported on 05/29/2021)   [DISCONTINUED] zolpidem (AMBIEN) 5 MG tablet Take 1 tablet (5 mg total) by mouth at bedtime as needed for sleep. (Patient not taking: No sig reported)   No facility-administered encounter medications on file as of 05/29/2021.    Objective:   PHYSICAL EXAMINATION:    VITALS:   Vitals:   05/29/21 0820  BP: 122/74  Pulse:  75  SpO2: 98%  Weight: 142 lb (64.4 kg)  Height: 4\' 9"  (1.448 m)      GEN:  The patient appears stated age and is in NAD. HEENT:  Normocephalic, atraumatic.  The mucous membranes are moist. The superficial temporal arteries are without ropiness or tenderness.   Neurological examination:  Orientation: The patient is alert and oriented x3. Cranial nerves: There is good facial symmetry with min facial hypomimia. The speech is fluent and clear (she does speak English most of the time). Soft palate rises symmetrically and there is no  tongue deviation. Hearing is intact to conversational tone. Sensation: Sensation is intact to light touch throughout Motor: Strength is at least antigravity x4.  Movement examination: Tone: There is nl tone in the UE/LE Abnormal movements: there is bilateral UE and LE rest tremor, mod in intensity Coordination:  There is mild  decremation with RAM's, on the L Gait and Station: The patient has no difficulty arising out of a deep-seated chair without the use of the hands. The patient's stride length is good.    I have reviewed and interpreted the following labs independently    Chemistry      Component Value Date/Time   NA 139 03/25/2019 1553   K 4.0 03/25/2019 1553   CL 103 03/25/2019 1553   CO2 23 03/25/2019 1553   BUN 8 03/25/2019 1553   CREATININE 0.51 (L) 03/25/2019 1553   CREATININE 0.64 08/24/2015 1134      Component Value Date/Time   CALCIUM 9.2 03/25/2019 1553   ALKPHOS 56 10/13/2018 2239   AST 32 10/13/2018 2239   ALT 23 10/13/2018 2239   BILITOT 0.6 10/13/2018 2239       Lab Results  Component Value Date   WBC 11.0 (H) 10/13/2018   HGB 12.2 10/13/2018   HCT 36.2 10/13/2018   MCV 92.3 10/13/2018   PLT 284 10/13/2018    Lab Results  Component Value Date   TSH 1.060 08/25/2019     Total time spent on today's visit was 32 minutes, including both face-to-face time and nonface-to-face time.  Time included that spent on review of records (prior notes available to me/labs/imaging if pertinent), discussing treatment and goals, answering patient's questions and coordinating care.  Cc:  08/27/2019, MD (Inactive)

## 2021-05-29 ENCOUNTER — Encounter: Payer: Self-pay | Admitting: Neurology

## 2021-05-29 ENCOUNTER — Other Ambulatory Visit: Payer: Self-pay

## 2021-05-29 ENCOUNTER — Ambulatory Visit (INDEPENDENT_AMBULATORY_CARE_PROVIDER_SITE_OTHER): Payer: Medicaid Other | Admitting: Neurology

## 2021-05-29 VITALS — BP 122/74 | HR 75 | Ht <= 58 in | Wt 142.0 lb

## 2021-05-29 DIAGNOSIS — G20 Parkinson's disease: Secondary | ICD-10-CM

## 2021-05-29 DIAGNOSIS — G4700 Insomnia, unspecified: Secondary | ICD-10-CM

## 2021-05-29 MED ORDER — ROPINIROLE HCL 1 MG PO TABS: 1.0000 mg | ORAL_TABLET | Freq: Three times a day (TID) | ORAL | 0 refills | Status: DC

## 2021-05-29 MED ORDER — ROPINIROLE HCL 0.25 MG PO TABS: ORAL_TABLET | ORAL | 0 refills | Status: DC

## 2021-05-29 MED ORDER — CARBIDOPA-LEVODOPA 25-100 MG PO TABS: ORAL_TABLET | ORAL | 1 refills | Status: DC

## 2021-05-29 NOTE — Patient Instructions (Addendum)
Start requip (ropinirole) as follows:  requip 0.25 mg three times a day x 1 week and then 0.5 mg tid x three times a day for a week and then 0.75 mg three times a day x 1 week and then 1.0 mg tid thereafter    Increase carbidopa/levodopa 25/100, 2 at 8am, 1 at noon, 1 at 4pm  You can take the ropinirole at the same time as the carbidopa/levodopa 25/100.

## 2021-06-06 ENCOUNTER — Ambulatory Visit
Admission: RE | Admit: 2021-06-06 | Discharge: 2021-06-06 | Disposition: A | Discharge status: Home / Self Care | Payer: Medicaid Other | Source: Ambulatory Visit | Attending: Orthopaedic Surgery | Admitting: Orthopaedic Surgery

## 2021-06-06 ENCOUNTER — Other Ambulatory Visit: Payer: Self-pay

## 2021-06-06 DIAGNOSIS — G20 Parkinson's disease: Secondary | ICD-10-CM | POA: Diagnosis not present

## 2021-06-06 DIAGNOSIS — G8929 Other chronic pain: Secondary | ICD-10-CM

## 2021-06-06 DIAGNOSIS — M25611 Stiffness of right shoulder, not elsewhere classified: Secondary | ICD-10-CM | POA: Diagnosis not present

## 2021-06-06 IMAGING — XA DG FLUORO GUIDE NDL PLC/BX
3 series · 3 of 3 positions shown · non-contrast
Comparison: none

CLINICAL DATA: Pain and limited range of motion. Parkinson's
disease.

[Series 1: ortho standard · 1 of 1 slices shown (1 of 3)]
[im 1/1]
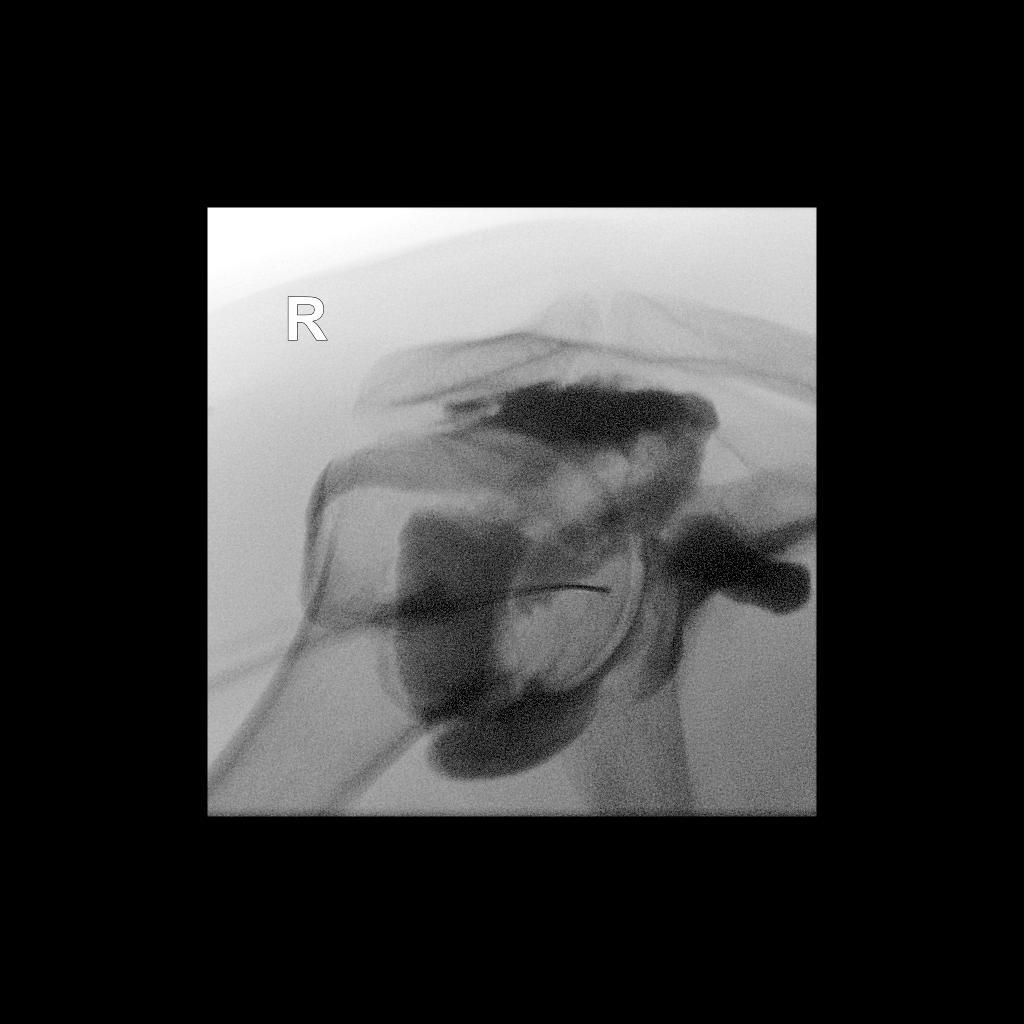

[Series 2: ortho standard · 1 of 1 slices shown (2 of 3)]
[im 1/1]
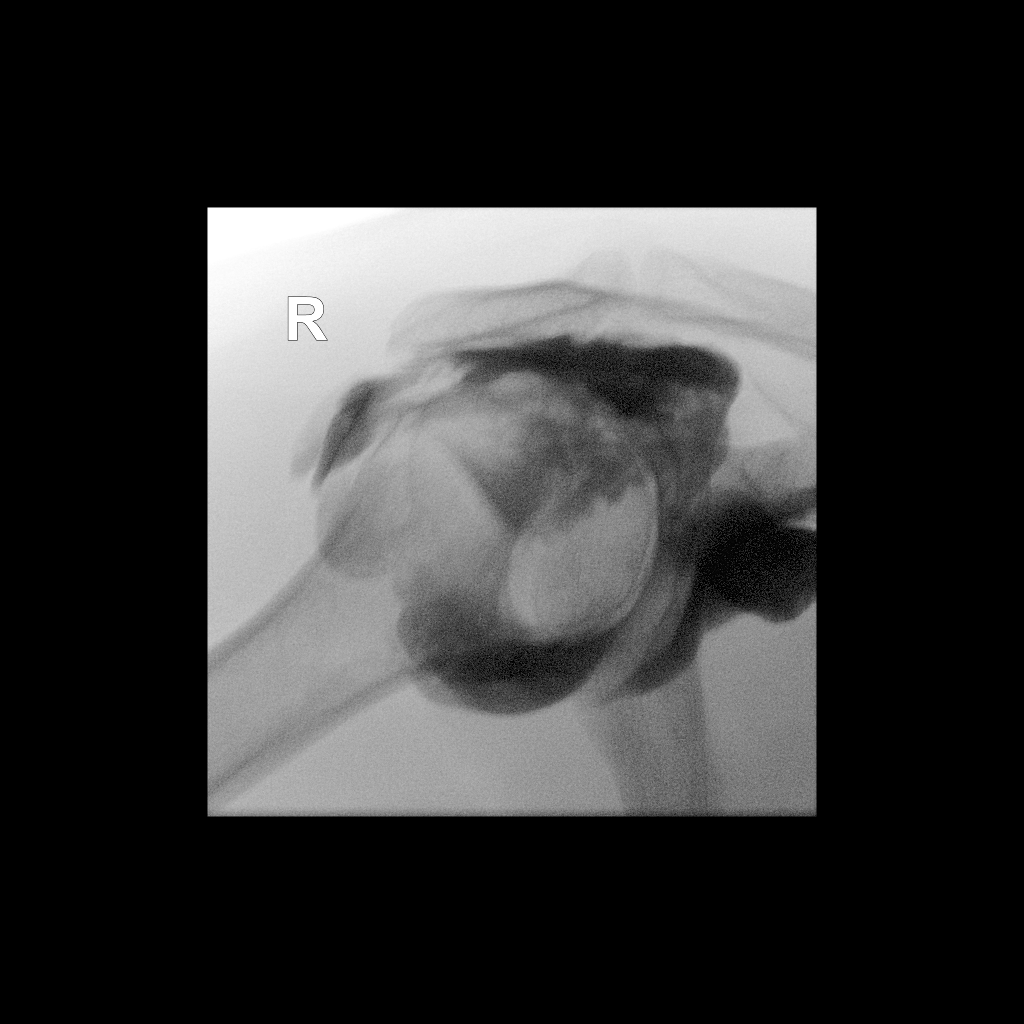

[Series 3: ortho standard · 1 of 1 slices shown (3 of 3)]
[im 1/1]
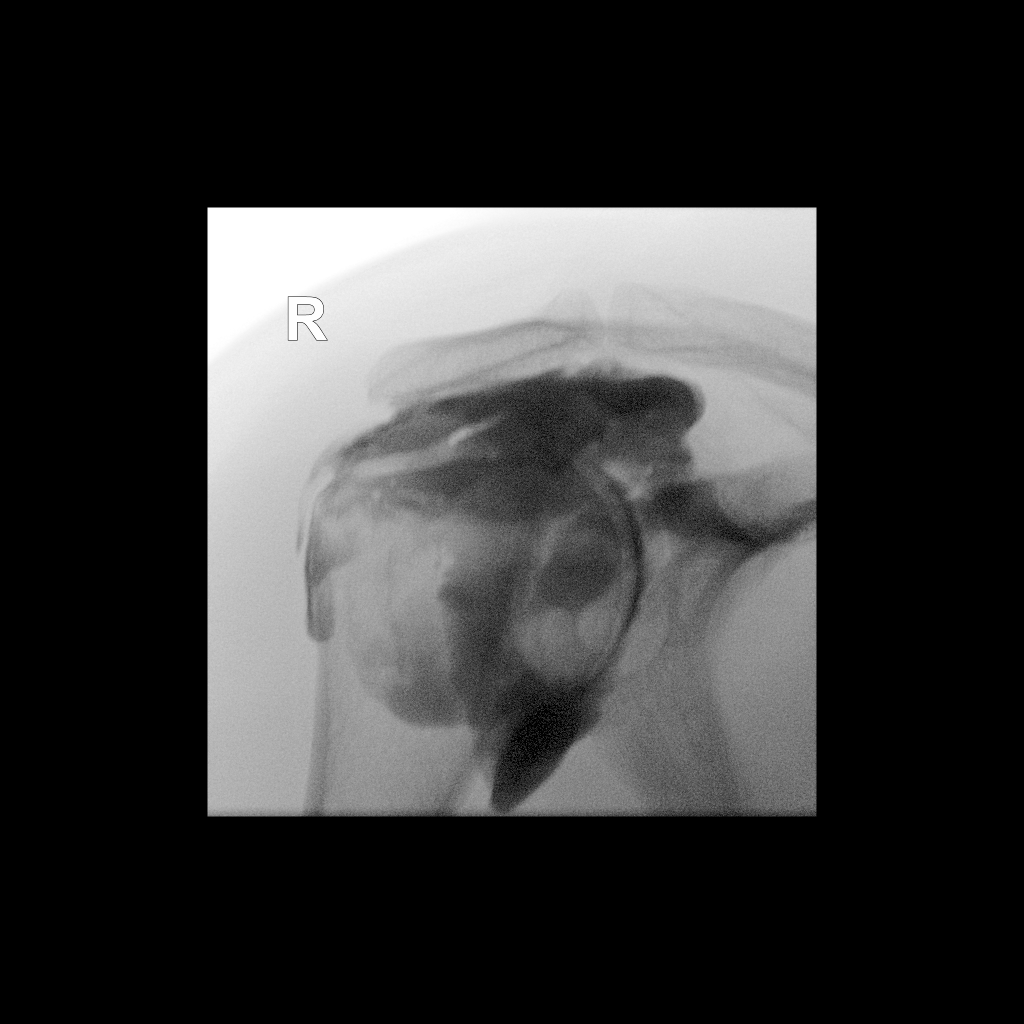

[3 of 3 positions shown; findings below may reference images not displayed]

FLUOROSCOPY TIME:  0 minutes 35 seconds. 6.47 micro gray meter
squared

PROCEDURE:
Right SHOULDER INJECTION UNDER FLUOROSCOPY

The skin overlying the shoulder was scrubbed with Betadine and
draped in sterile fashion. Skin and subcutaneous anesthesia was
carried out using a 25 gauge needle and 1% lidocaine. A 22 gauge
spinal needle was directed under fluoroscopic guidance on one pass
into the glenohumeral joint. 20 cc of a mixture of 0.1 cc MultiHance
and dilute Isovue 200 was then used to fill the glenohumeral joint.

Filming shows a large rotator cuff tear with free passage of
contrast into the subacromial subdeltoid bursa.
IMPRESSION: Technically successful right shoulder injection for MRI. Large
rotator cuff tear.

## 2021-06-06 MED ORDER — IOPAMIDOL (ISOVUE-M 200) INJECTION 41%
13.0000 mL | Freq: Once | INTRAMUSCULAR | Status: AC
  Administered 2021-06-06: 13 mL via INTRA_ARTICULAR

## 2021-06-07 NOTE — Progress Notes (Signed)
Needs appt.  Thanks.

## 2021-06-08 ENCOUNTER — Other Ambulatory Visit: Payer: Self-pay

## 2021-06-08 ENCOUNTER — Encounter (HOSPITAL_COMMUNITY): Payer: Self-pay | Admitting: Emergency Medicine

## 2021-06-08 ENCOUNTER — Emergency Department (HOSPITAL_COMMUNITY)
Admission: EM | Admit: 2021-06-08 | Discharge: 2021-06-08 | Disposition: A | Discharge status: Home / Self Care | Payer: Medicaid Other | Attending: Emergency Medicine | Admitting: Emergency Medicine

## 2021-06-08 ENCOUNTER — Telehealth: Payer: Self-pay | Admitting: Orthopaedic Surgery

## 2021-06-08 ENCOUNTER — Emergency Department (HOSPITAL_COMMUNITY): Payer: Medicaid Other

## 2021-06-08 DIAGNOSIS — S199XXA Unspecified injury of neck, initial encounter: Secondary | ICD-10-CM | POA: Insufficient documentation

## 2021-06-08 DIAGNOSIS — R519 Headache, unspecified: Secondary | ICD-10-CM | POA: Diagnosis not present

## 2021-06-08 DIAGNOSIS — Y92410 Unspecified street and highway as the place of occurrence of the external cause: Secondary | ICD-10-CM | POA: Diagnosis not present

## 2021-06-08 DIAGNOSIS — M542 Cervicalgia: Secondary | ICD-10-CM | POA: Diagnosis not present

## 2021-06-08 DIAGNOSIS — S0990XA Unspecified injury of head, initial encounter: Secondary | ICD-10-CM | POA: Diagnosis not present

## 2021-06-08 DIAGNOSIS — S299XXA Unspecified injury of thorax, initial encounter: Secondary | ICD-10-CM | POA: Insufficient documentation

## 2021-06-08 DIAGNOSIS — M546 Pain in thoracic spine: Secondary | ICD-10-CM | POA: Diagnosis not present

## 2021-06-08 IMAGING — DX DG THORACIC SPINE 2V
3 series · 3 of 3 positions shown · non-contrast
Comparison: [DATE].

CLINICAL DATA: Back pain.

EXAM:
THORACIC SPINE 2 VIEWS

[t-spine ap]
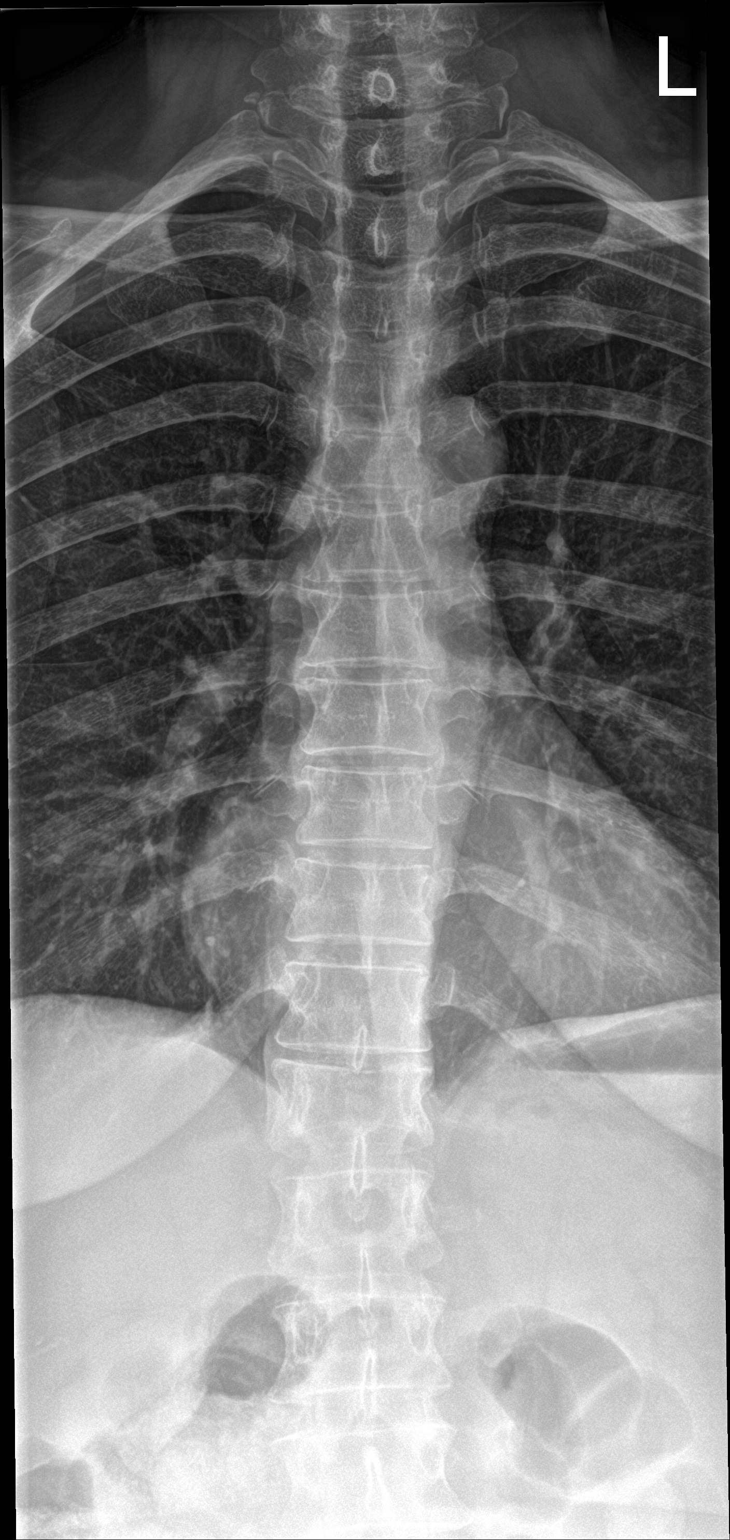

[t-spine lat]
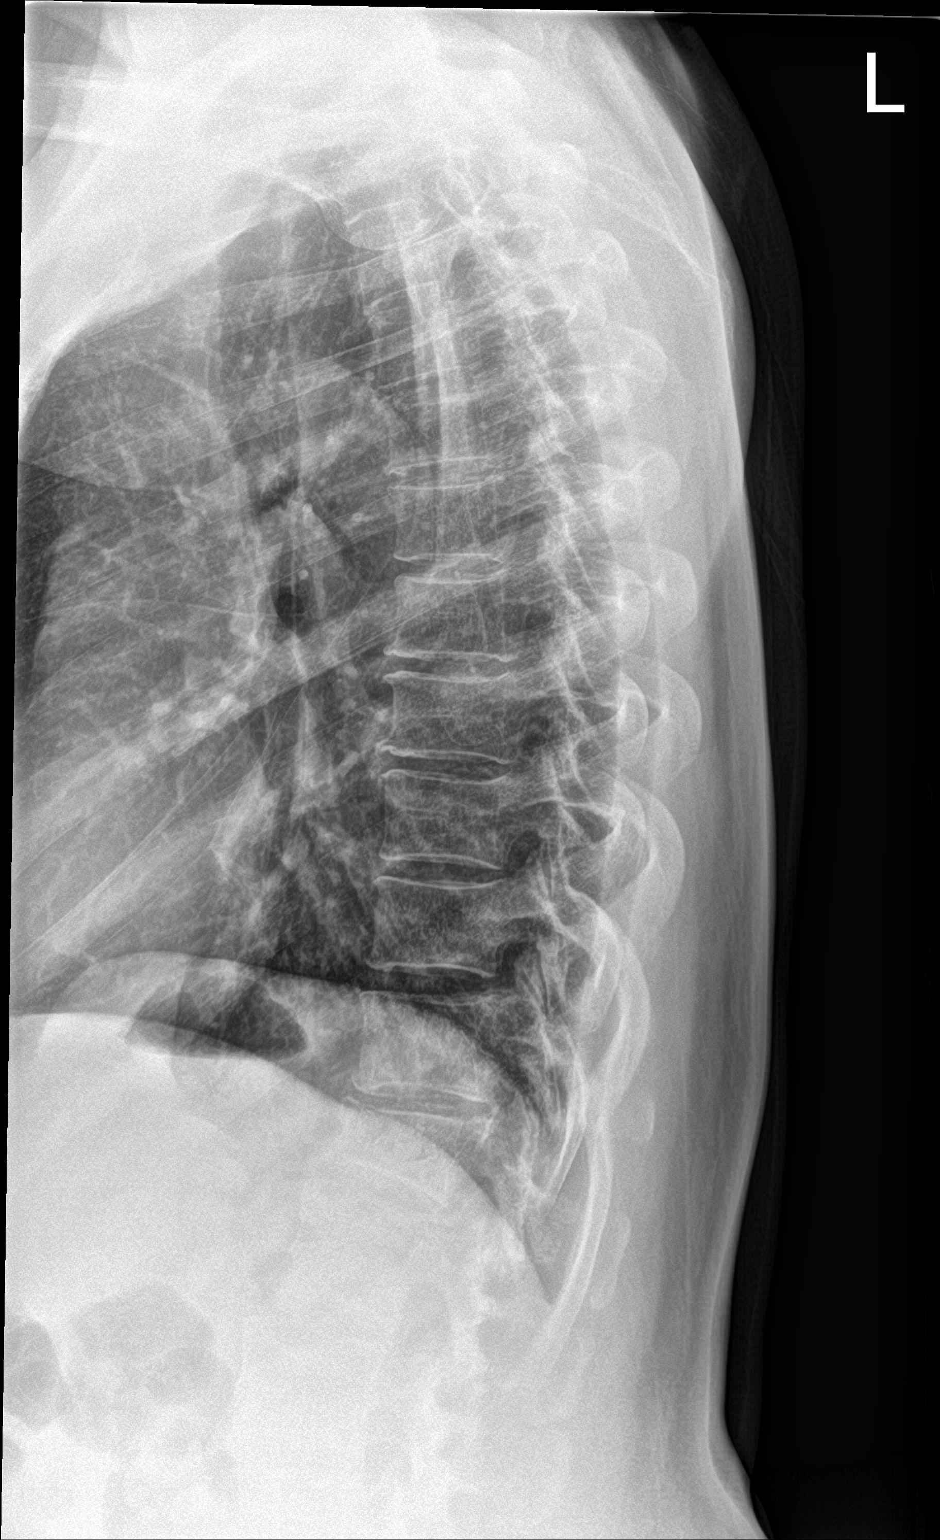

[t-spine swimmers]
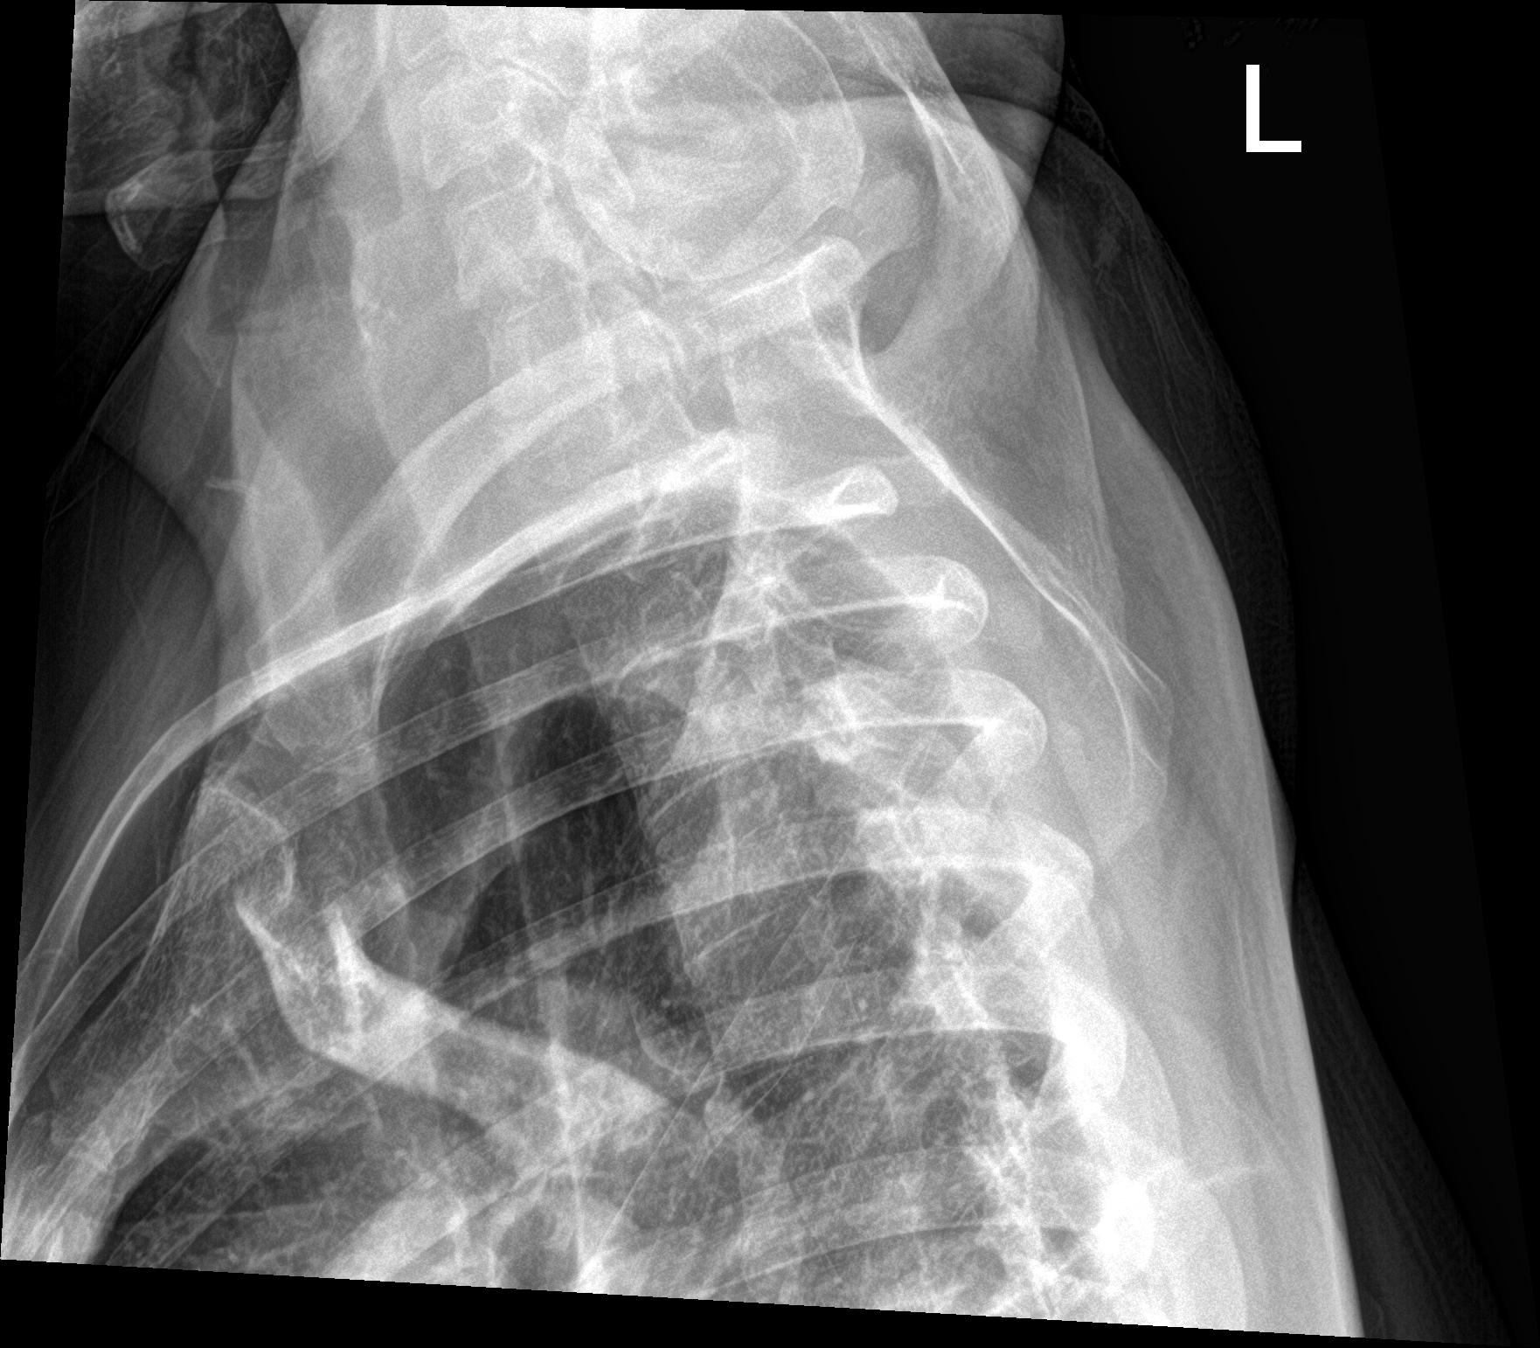

[3 of 3 positions shown; findings below may reference images not displayed]

FINDINGS: Stable compression deformity of T12 vertebral body consistent with
old fracture. No acute fracture or spondylolisthesis is noted.
IMPRESSION: No acute abnormality seen.  Stable old T12 fracture.
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
## 2021-06-08 MED ORDER — ACETAMINOPHEN 325 MG PO TABS
650.0000 mg | ORAL_TABLET | Freq: Once | ORAL | Status: AC
  Administered 2021-06-08: 650 mg via ORAL
  Filled 2021-06-08: qty 2
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
## 2021-06-08 NOTE — Telephone Encounter (Signed)
Called patient left message on voicemail for patient to call back and schedule an appointment for MRI review with Dr Erlinda Hong

## 2021-06-08 NOTE — Discharge Instructions (Addendum)
-   no evidence of acute injury was seen on your imaging.  - Please use over the counter tylenol and ibuprofen for pain and follow up with your primary doctor.

## 2021-06-11 ENCOUNTER — Telehealth: Payer: Self-pay

## 2021-06-11 NOTE — Telephone Encounter (Signed)
Transition Care Management Unsuccessful Follow-up Telephone Call  Date of discharge and from where:  06/08/2021 from [facility]  Attempts:  1st Attempt  Reason for unsuccessful TCM follow-up call:  Left voice message
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
## 2021-06-13 DIAGNOSIS — Z419 Encounter for procedure for purposes other than remedying health state, unspecified: Secondary | ICD-10-CM | POA: Diagnosis not present

## 2021-07-11 DIAGNOSIS — Z419 Encounter for procedure for purposes other than remedying health state, unspecified: Secondary | ICD-10-CM | POA: Diagnosis not present

## 2021-07-18 ENCOUNTER — Telehealth: Payer: Self-pay | Admitting: Family Medicine

## 2021-07-18 NOTE — Telephone Encounter (Signed)
.. ?  Medicaid Managed Care  ? ?Unsuccessful Outreach Note ? ?07/18/2021 ?Name: Marissa Garcia MRN: [id number] DOB: May 09, 1966 ? ?Referred by: Antony Blackbird, MD ?Reason for referral : High Risk Managed Medicaid (I called the patient's daughter today to get her mom scheduled with the MM Team. She did not answer and there was not a VM.) ? ? ?An unsuccessful telephone outreach was attempted today. The patient was referred to the case management team for assistance with care management and care coordination.  ? ?Follow Up Plan: The care management team will reach out to the patient again over the next 14 days.  ? ?[name] ?Care Guide, High Risk Medicaid Managed Care ?Embedded Care Coordination ?[place]  ? ? ? ?

## 2021-08-08 ENCOUNTER — Encounter: Payer: Self-pay | Admitting: Neurology

## 2021-08-08 NOTE — Telephone Encounter (Signed)
I think patient is referring to you note stating that she doesn't need to go back and forth between care in [place] and here she already has an appointment scheduled on 11/08/21  ?

## 2021-08-11 DIAGNOSIS — Z419 Encounter for procedure for purposes other than remedying health state, unspecified: Secondary | ICD-10-CM | POA: Diagnosis not present

## 2021-09-05 ENCOUNTER — Encounter: Payer: Self-pay | Admitting: Physician Assistant

## 2021-09-05 ENCOUNTER — Ambulatory Visit: Payer: Medicaid Other | Attending: Physician Assistant | Admitting: Physician Assistant

## 2021-09-05 ENCOUNTER — Other Ambulatory Visit (HOSPITAL_COMMUNITY)
Admission: RE | Admit: 2021-09-05 | Discharge: 2021-09-05 | Disposition: A | Discharge status: Home / Self Care | Payer: Medicaid Other | Source: Ambulatory Visit | Attending: Physician Assistant | Admitting: Physician Assistant

## 2021-09-05 VITALS — BP 118/78 | HR 81 | Wt 143.0 lb

## 2021-09-05 DIAGNOSIS — Z113 Encounter for screening for infections with a predominantly sexual mode of transmission: Secondary | ICD-10-CM

## 2021-09-05 DIAGNOSIS — R739 Hyperglycemia, unspecified: Secondary | ICD-10-CM | POA: Diagnosis not present

## 2021-09-05 NOTE — Progress Notes (Signed)
Patient ID: Marissa Garcia, female   DOB: Oct 08, 1965, 56 y.o.   MRN: [id number] ? ? ?Glorianna Gott, is a 56 y.o. female ? ?NKN:[id number] ? ?[id number] ? ?DOB - 1966-01-16 ? ?No chief complaint on file. ?    ? ?Subjective:  ? ?Marissa Garcia is a 56 y.o. female here today for a STD screening.  Recently in a new relationship and wants STD screening bc she is unsure if partner is faithful.  No s/sx.  Wants note marked sensitive for privacy ?No problems updated. ? ?ALLERGIES: ?Allergies  ?Allergen Reactions  ? Propofol   ? ? ?PAST MEDICAL HISTORY: ?Past Medical History:  ?Diagnosis Date  ? Burst fracture of T12 vertebra (HCC) 10/13/2018  ? Complication of anesthesia   ? Constipation   ? Headache   ? Hypercholesteremia   ? ? ?MEDICATIONS AT HOME: ?Prior to Admission medications   ?Medication Sig Start Date End Date Taking? Authorizing Provider  ?BIPERIDEN HCL PO Take 2 mg by mouth 3 (three) times daily.    [provider]  ?carbidopa-levodopa (SINEMET IR) 25-100 MG tablet 2 at 8am, 1 at noon, 1 at 4pm 05/29/21   Tat, Octaviano Batty, DO  ?escitalopram (LEXAPRO) 10 MG tablet Take 1 tablet by mouth once daily 04/25/21   Tat, Octaviano Batty, DO  ?Melatonin 10 MG TABS Take 1 tablet by mouth daily.    [provider]  ?rOPINIRole (REQUIP) 0.25 MG tablet Take 0.25 mg by mouth 3 (three) times daily.    [provider]  ?rOPINIRole (REQUIP) 0.25 MG tablet equip 0.25 mg three times a day x 1 week and then 0.5 mg tid x three times a day for a week and then 0.75 mg three times a day 05/29/21   Tat, Octaviano Batty, DO  ?rOPINIRole (REQUIP) 1 MG tablet Take 1 tablet (1 mg total) by mouth 3 (three) times daily. 05/29/21   TatOctaviano Batty, DO  ?rotigotine (NEUPRO) 2 MG/24HR Samples of this drug were given to the patient, quantity 7 patches 1 box, Lot Number [id number] Exp: 11/2020. Patient instructed to use one patch every 24 hours ?Patient not taking: Reported on 05/29/2021 01/02/21   Tat, Octaviano Batty, DO  ?rotigotine (NEUPRO) 4 MG/24HR Place  1 patch onto the skin daily. ?Patient not taking: Reported on 05/29/2021 01/02/21   Tat, Octaviano Batty, DO  ?rotigotine (NEUPRO) 4 MG/24HR Samples of this drug were given to the patient, quantity 1 box/ 7 patches (Starter pack), Lot Number [id number] Exp: 11/2020  ? ?Patient instructed to use 1 patch every 24 hours ?Patient not taking: Reported on 05/29/2021 01/02/21   Tat, Octaviano Batty, DO  ?zolpidem (AMBIEN) 5 MG tablet Take 1 tablet (5 mg total) by mouth at bedtime as needed for sleep. ?Patient not taking: No sig reported 03/19/16 02/22/19  Dessa Phi, MD  ? ? ?ROS: ?Neg HEENT ?Neg resp ?Neg cardiac ?Neg GI ?Neg GU ?Neg MS ?Neg psych ?Neg neuro ? ?Objective:  ? ?Vitals:  ? 09/05/21 1551  ?BP: 118/78  ?Pulse: 81  ?Weight: 143 lb (64.9 kg)  ? ?Exam ?General appearance : Awake, alert, not in any distress. Speech Clear. Not toxic looking ?HEENT: Atraumatic and Normocephalic ?Chest: Good air entry bilaterally, CTAB.  No rales/rhonchi/wheezing ?CVS: S1 S2 regular, no murmurs.  ?Extremities: B/L Lower Ext shows no edema, both legs are warm to touch ?Neurology: Awake alert, and oriented X 3, CN II-XII intact, Non focal ?Skin: No Rash ? ?Data Review ?Lab Results  ?Component Value Date  ?  HGBA1C 5.5 03/25/2019  ? HGBA1C 5.4 01/25/2016  ? HGBA1C  02/16/2010  ?  5.6 ?(NOTE)                                                                       According to the ADA Clinical Practice Recommendations for 2011, when HbA1c is used as a screening test:   >=6.5%   Diagnostic of Diabetes Mellitus           (if abnormal result ? is confirmed)  5.7-6.4%   Increased risk of developing Diabetes Mellitus  References:Diagnosis and Classification of Diabetes Mellitus,Diabetes Care,2011,34(Suppl 1):S62-S69 and Standards of Medical Care in         Diabetes - 2011,Diabetes FKCL,2751,70  ?(Suppl 1):S11-S61.  ? ? ?Assessment & Plan  ? ?1. Hyperglycemia ?- Hemoglobin A1c ? ?2. Screening examination for STD (sexually transmitted disease) ?Self swab, last  pap 2021 and Normal ?- Cervicovaginal ancillary only ?- HIV antibody (with reflex) ?- RPR w/reflex to TrepSure ?Safe sex practices reviewed ? ? ?Return in about 6 months (around 03/07/2022) for assign PCP. ? ?The patient was given clear instructions to go to ER or return to medical center if symptoms don't improve, worsen or new problems develop. The patient verbalized understanding. The patient was told to call to get lab results if they haven't heard anything in the next week.  ? ? ? ? ?Georgian Co, PA-C ?[facility] ?[place], [place] ?[phone]   ?09/05/2021, 4:24 PM  ?

## 2021-09-06 LAB — T PALLIDUM ANTIBODY, EIA: T pallidum Antibody, EIA: NEGATIVE

## 2021-09-06 LAB — CERVICOVAGINAL ANCILLARY ONLY
Bacterial Vaginitis (gardnerella): NEGATIVE
Candida Glabrata: NEGATIVE
Candida Vaginitis: NEGATIVE
Chlamydia: NEGATIVE
Comment: NEGATIVE
Comment: NEGATIVE
Comment: NEGATIVE
Comment: NEGATIVE
Comment: NEGATIVE
Comment: NORMAL
Neisseria Gonorrhea: NEGATIVE
Trichomonas: NEGATIVE

## 2021-09-06 LAB — HEMOGLOBIN A1C
Est. average glucose Bld gHb Est-mCnc: 123 mg/dL
Hgb A1c MFr Bld: 5.9 % — ABNORMAL HIGH (ref 4.8–5.6)

## 2021-09-06 LAB — RPR W/REFLEX TO TREPSURE: RPR: NONREACTIVE

## 2021-09-06 LAB — HIV ANTIBODY (ROUTINE TESTING W REFLEX): HIV Screen 4th Generation wRfx: NONREACTIVE

## 2021-09-10 DIAGNOSIS — Z419 Encounter for procedure for purposes other than remedying health state, unspecified: Secondary | ICD-10-CM | POA: Diagnosis not present

## 2021-10-06 ENCOUNTER — Other Ambulatory Visit: Payer: Self-pay | Admitting: Neurology

## 2021-10-10 ENCOUNTER — Other Ambulatory Visit: Payer: Self-pay | Admitting: Neurology

## 2021-10-11 DIAGNOSIS — Z419 Encounter for procedure for purposes other than remedying health state, unspecified: Secondary | ICD-10-CM | POA: Diagnosis not present

## 2021-10-11 NOTE — Telephone Encounter (Signed)
Enough given until appt with Dr.Rebecca Tat on 10/29/2021
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
## 2021-10-22 ENCOUNTER — Encounter: Payer: Self-pay | Admitting: Neurology

## 2021-10-22 ENCOUNTER — Ambulatory Visit: Payer: Medicaid Other | Admitting: Neurology

## 2021-10-22 VITALS — BP 110/60 | HR 63 | Ht <= 58 in | Wt 139.4 lb

## 2021-10-22 DIAGNOSIS — G4700 Insomnia, unspecified: Secondary | ICD-10-CM

## 2021-10-22 DIAGNOSIS — G20 Parkinson's disease: Secondary | ICD-10-CM | POA: Diagnosis not present

## 2021-10-22 MED ORDER — ROPINIROLE HCL 1 MG PO TABS
1.0000 mg | ORAL_TABLET | Freq: Three times a day (TID) | ORAL | 1 refills | Status: DC
Start: 2021-10-22 — End: 2022-07-17

## 2021-10-22 MED ORDER — TRAZODONE HCL 50 MG PO TABS: 50.0000 mg | ORAL_TABLET | Freq: Every day | ORAL | 1 refills | Status: DC

## 2021-10-22 NOTE — Patient Instructions (Addendum)
As we discussed, it used to be thought that levodopa would increase risk of melanoma but now it is believed that Parkinsons itself likely increases risk of melanoma. I recommend yearly skin checks with a board certified dermatologist.  You can call [facility] or [facility] for an appointment.  [facility] Address: [address] [place], [place], [place] [address] Phone: [phone]  [facility] [address] [place], [place], [place] Phone: [phone]  2.  You can trial trazodone, 50 mg at bedtime for sleep  The physicians and staff at [facility] are committed to providing excellent care. You may receive a survey requesting feedback about your experience at our office. We strive to receive "very good" responses to the survey questions. If you feel that your experience would prevent you from giving the office a "very good " response, please contact our office to try to remedy the situation. We may be reached at [phone]. Thank you for taking the time out of your busy day to complete the survey.

## 2021-10-29 ENCOUNTER — Ambulatory Visit: Payer: Self-pay

## 2021-10-29 ENCOUNTER — Ambulatory Visit: Payer: Medicaid Other | Admitting: Neurology

## 2021-10-29 NOTE — Telephone Encounter (Signed)
Interpreter Nidia # [id number]  Chief Complaint: finger swelling  Symptoms: R hand 4 digit swelling, purplish and tingling and pain 4/10 Frequency: yesterday Pertinent Negatives: NA Disposition: [] ED /[] Urgent Care (no appt availability in office) / [] Appointment(In office/virtual)/ []  [facility]/ [] Home Care/ [] Refused Recommended Disposition /[x] [facility]/ []  Follow-up with PCP Additional Notes: pt unsure what happened to finger but is concerned since she is prediabetic. Pt states her BS was 160 this past weekend after having a margarita. Advised her to drink plenty of fluids to help decrease BS and that was nothing to worry about. No appts with practice so advised pt she could do virtual UC visit or go to mobile unit. Pt chose to go to Mobile Unit tomorrow 10/30/21.   Summary: Finger swelling, pre-diabetic   Pt called to report that her finger is swelling, she is pre-diabetic.   Best contact: [phone]      Reason for Disposition  [1] MODERATE pain (e.g., interferes with normal activities) AND [2] present > 3 days  Answer Assessment - Initial Assessment Questions 1. ONSET: "When did the pain start?"      Yesterday  2. LOCATION and RADIATION: "Where is the pain located?"  (e.g., fingertip, around nail, joint, entire  finger)      R hand 4 digit  3. SEVERITY: "How bad is the pain?" "What does it keep you from doing?"   (Scale 1-10; or mild, moderate, severe)  - MILD (1-3): doesn't interfere with normal activities.   - MODERATE (4-7): interferes with normal activities or awakens from sleep.  - SEVERE (8-10): excruciating pain, unable to hold a glass of water or bend finger even a little.     Only when bending or touching 4/10 4. APPEARANCE: "What does the finger look like?" (e.g., redness, swelling, bruising, pallor)     Purplish in color 6. CAUSE: "What do you think is causing the pain?"     unsure 8. OTHER SYMPTOMS: "Do you have any other symptoms?" (e.g.,  fever, neck pain, numbness)     no  Protocols used: Finger Pain-A-AH

## 2021-10-30 ENCOUNTER — Ambulatory Visit: Payer: Medicaid Other | Admitting: Physician Assistant

## 2021-10-30 ENCOUNTER — Encounter: Payer: Self-pay | Admitting: Physician Assistant

## 2021-10-30 VITALS — BP 111/66 | Ht 59.0 in | Wt 138.0 lb

## 2021-10-30 DIAGNOSIS — G4700 Insomnia, unspecified: Secondary | ICD-10-CM | POA: Diagnosis not present

## 2021-10-30 DIAGNOSIS — L03011 Cellulitis of right finger: Secondary | ICD-10-CM | POA: Diagnosis not present

## 2021-10-30 MED ORDER — CEPHALEXIN 500 MG PO CAPS: 500.0000 mg | ORAL_CAPSULE | Freq: Four times a day (QID) | ORAL | 0 refills | Status: AC

## 2021-10-30 NOTE — Progress Notes (Unsigned)
   Established Patient Office Visit  Subjective   Patient ID: Marissa Garcia, female    DOB: 05/04/1966  Age: 56 y.o. MRN: [id number]  No chief complaint on file.   Finger started having purple color  Numbness in right hand More pain in whole hand but finger does not hurt Has tried massage with some relief  W[place] last [place] - friday night started feeling bad - states that she was dehydrated Started feeling poor again Did not chech blood sugar this morning  Does not use finger to check   Water - pedialyte 2 1/2 glasses of water One cup of pedialyte   Sleep -  Sunday - 3 hours  Melatonin - not helping  Was prescribed trazodone on 6/12     {History (Optional):23778}  Review of Systems  Constitutional: Negative.   HENT: Negative.    Eyes: Negative.   Respiratory:  Negative for shortness of breath.   Cardiovascular:  Negative for chest pain.  Skin: Negative.       Objective:     LMP 03/19/2016  {Vitals History (Optional):23777}  Physical Exam   No results found for any visits on 10/30/21.  {Labs (Optional):23779}  The ASCVD Risk score (Arnett DK, et al., 2019) failed to calculate for the following reasons:   Cannot find a previous HDL lab   Cannot find a previous total cholesterol lab    Assessment & Plan:   Problem List Items Addressed This Visit   None   No follow-ups on file.    Kasandra Knudsen Mayers, PA-C

## 2021-10-30 NOTE — Patient Instructions (Signed)
You are going to take Keflex 4 times a day for 7 days.    I encourage you to get at least 7 hours of sleep - use the trazodone as directed  I encourage you to drink at least 64 ounces of water a day  Roney Jaffe, PA-C Physician Assistant [facility] [email or web address]   Cellulitis, Adult  Cellulitis is a skin infection. The infected area is usually warm, red, swollen, and tender. This condition occurs most often in the arms and lower legs. The infection can travel to the muscles, blood, and underlying tissue and become serious. It is very important to get treated for this condition. What are the causes? Cellulitis is caused by bacteria. The bacteria enter through a break in the skin, such as a cut, burn, insect bite, open sore, or crack. What increases the risk? This condition is more likely to occur in people who: Have a weak body defense system (immune system). Have open wounds on the skin, such as cuts, burns, bites, and scrapes. Bacteria can enter the body through these open wounds. Are older than 55 years of age. Have diabetes. Have a type of long-lasting (chronic) liver disease (cirrhosis) or kidney disease. Are obese. Have a skin condition such as: Itchy rash (eczema). Slow movement of blood in the veins (venous stasis). Fluid buildup below the skin (edema). Have had radiation therapy. Use IV drugs. What are the signs or symptoms? Symptoms of this condition include: Redness, streaking, or spotting on the skin. Swollen area of the skin. Tenderness or pain when an area of the skin is touched. Warm skin. A fever. Chills. Blisters. How is this diagnosed? This condition is diagnosed based on a medical history and physical exam. You may also have tests, including: Blood tests. Imaging tests. How is this treated? Treatment for this condition may include: Medicines, such as antibiotic medicines or medicines to  treat allergies (antihistamines). Supportive care, such as rest and application of cold or warm cloths (compresses) to the skin. Hospital care, if the condition is severe. The infection usually starts to get better within 1-2 days of treatment. Follow these instructions at home:  Medicines Take over-the-counter and prescription medicines only as told by your health care provider. If you were prescribed an antibiotic medicine, take it as told by your health care provider. Do not stop taking the antibiotic even if you start to feel better. General instructions Drink enough fluid to keep your urine pale yellow. Do not touch or rub the infected area. Raise (elevate) the infected area above the level of your heart while you are sitting or lying down. Apply warm or cold compresses to the affected area as told by your health care provider. Keep all follow-up visits as told by your health care provider. This is important. These visits let your health care provider make sure a more serious infection is not developing. Contact a health care provider if: You have a fever. Your symptoms do not begin to improve within 1-2 days of starting treatment. Your bone or joint underneath the infected area becomes painful after the skin has healed. Your infection returns in the same area or another area. You notice a swollen bump in the infected area. You develop new symptoms. You have a general ill feeling (malaise) with muscle aches and pains. Get help right away if: Your symptoms get worse. You feel very sleepy. You develop vomiting or diarrhea that persists. You notice red streaks coming from the infected area. Your red  area gets larger or turns dark in color. These symptoms may represent a serious problem that is an emergency. Do not wait to see if the symptoms will go away. Get medical help right away. Call your local emergency services (911 in the [place] Do not drive yourself to the  hospital. Summary Cellulitis is a skin infection. This condition occurs most often in the arms and lower legs. Treatment for this condition may include medicines, such as antibiotic medicines or antihistamines. Take over-the-counter and prescription medicines only as told by your health care provider. If you were prescribed an antibiotic medicine, do not stop taking the antibiotic even if you start to feel better. Contact a health care provider if your symptoms do not begin to improve within 1-2 days of starting treatment or your symptoms get worse. Keep all follow-up visits as told by your health care provider. This is important. These visits let your health care provider make sure that a more serious infection is not developing. This information is not intended to replace advice given to you by your health care provider. Make sure you discuss any questions you have with your health care provider. Document Revised: 02/08/2021 Document Reviewed: 02/08/2021 Elsevier Patient Education  2023 [employer].

## 2021-11-10 DIAGNOSIS — Z419 Encounter for procedure for purposes other than remedying health state, unspecified: Secondary | ICD-10-CM | POA: Diagnosis not present

## 2021-11-20 ENCOUNTER — Ambulatory Visit: Payer: Medicaid Other | Admitting: Orthopaedic Surgery

## 2021-11-20 ENCOUNTER — Encounter: Payer: Self-pay | Admitting: Orthopaedic Surgery

## 2021-11-20 DIAGNOSIS — G8929 Other chronic pain: Secondary | ICD-10-CM | POA: Diagnosis not present

## 2021-11-20 DIAGNOSIS — M25511 Pain in right shoulder: Secondary | ICD-10-CM | POA: Diagnosis not present

## 2021-11-20 NOTE — Progress Notes (Unsigned)
Office Visit Note   Patient: Marissa Garcia           Date of Birth: 01/07/1966           MRN: [id number] Visit Date: 11/20/2021              Requested by: Claiborne Rigg, NP [address] [place] [address] [place],  [place] [address] PCP: Claiborne Rigg, NP   Assessment & Plan: Visit Diagnoses:  1. Chronic right shoulder pain     Plan: Impression is chronic right shoulder pain with MRI findings showing a large full-thickness supraspinatus tear with moderate to high-grade muscle atrophy in addition to moderate AC osteoarthritis.  Based on findings, recommended referral to Dr. August Saucer for further evaluation and treatment recommendation.  Patient understands and agrees.  She will call with concerns or questions in meantime.  Follow-Up Instructions: Return for with Dr. August Saucer.   Orders:  No orders of the defined types were placed in this encounter.  No orders of the defined types were placed in this encounter.     Procedures: No procedures performed   Clinical Data: No additional findings.   Subjective: Chief Complaint  Patient presents with   Right Shoulder - Pain    HPI patient is a pleasant 56 year old female who is here today with a Spanish interpreter.  She is here with chronic right shoulder pain.  She sustained an injury during the onset of COVID for which she fell in the right shoulder.  She was seen in our office last November where MRI was ordered as she underwent cortisone injection by a primary care sports medicine physician which only helped for about a month.Marland Kitchen  MRI was performed in January of this past year.  She is just now following up.  She has continued pain to the right shoulder.  Pain is constant but worse at night as well as with lifting anything.  She has associated weakness.  She has not been to physical therapy for this.  Review of Systems as detailed in HPI.  All others reviewed and are negative.   Objective: Vital Signs: LMP 03/19/2016   Physical Exam  well-developed well-nourished female no acute distress.  Alert and oriented x3.  Ortho Exam right shoulder exam reveals near full active range of motion in all planes.  3 out of 5 strength with resisted empty can test.  Otherwise 4 and half/5 strength throughout.  She is neurovascular intact distally.  Specialty Comments:  No specialty comments available.  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   Perimenopausal 01/25/2016   DENTAL CARIES 07/05/2010   HEADACHE 03/21/2010   ANXIETY STATE, UNSPECIFIED 02/27/2010   ACID REFLUX DISEASE 02/27/2010   WRIST PAIN, RIGHT 02/27/2010   DIZZINESS 02/27/2010   CHEST PAIN 02/15/2010   HYPERCHOLESTEROLEMIA 05/13/2008   Past Medical History:  Diagnosis Date   Burst fracture of T12 vertebra (HCC) 10/13/2018   Complication of anesthesia    Constipation    Headache    Hypercholesteremia     Family History  Problem Relation Age of Onset   Hypertension Father    Hyperlipidemia Father    Hyperlipidemia Mother    Parkinson's disease Neg Hx     Past Surgical History:  Procedure Laterality Date   CESAREAN SECTION     Social History   Occupational History  Not on file  Tobacco Use   Smoking status: Never   Smokeless tobacco: Never  Vaping Use   Vaping Use: Never used  Substance and Sexual Activity   Alcohol use: Yes    Comment: rare   Drug use: No   Sexual activity: Yes    Birth control/protection: None

## 2021-12-05 ENCOUNTER — Ambulatory Visit: Payer: Medicaid Other | Admitting: Orthopedic Surgery

## 2021-12-05 DIAGNOSIS — G8929 Other chronic pain: Secondary | ICD-10-CM | POA: Diagnosis not present

## 2021-12-05 DIAGNOSIS — M25511 Pain in right shoulder: Secondary | ICD-10-CM

## 2021-12-08 ENCOUNTER — Encounter: Payer: Self-pay | Admitting: Orthopedic Surgery

## 2021-12-08 NOTE — Progress Notes (Signed)
Office Visit Note   Patient: Marissa Garcia           Date of Birth: 01/09/1966           MRN: [id number] Visit Date: 12/05/2021 Requested by: Claiborne Rigg, NP [address] [place] [address] [place],  [place] [address] PCP: Claiborne Rigg, NP  Subjective: Chief Complaint  Patient presents with   Right Shoulder - Pain    HPI: [name] is a 56 year old patient with right shoulder pain.  She has a history of falls.  Does report relatively constant pain in the shoulder with pain full range of motion.  Weakness is less of a problem for Marissa Garcia than the pain.  Injection helped her for a month.  MRI scan from January of this year demonstrates large full-thickness tear of the entire supraspinatus and most of the infraspinatus tendon with retraction to the superior aspect of the glenoid.  Subscapularis and teres minor are intact.  There is infraspinatus and supraspinatus muscle atrophy and infiltration.  Regarding the glenohumeral joint there are mild to moderate degenerative changes of the posterior superior glenoid cartilage.  No definite Hill-Sachs impaction fracture from prior shoulder dislocation.  In talking with Riles she is somewhat reluctant to undergo any type of surgical intervention and has maintained reasonable function of the shoulder despite the rotator cuff pathology present              ROS: All systems reviewed are negative as they relate to the chief complaint within the history of present illness.  Patient denies  fevers or chills.   Assessment & Plan: Visit Diagnoses:  1. Chronic right shoulder pain     Plan: Impression is right shoulder pain with massive posterior superior rotator cuff tear.  Patient is able to forward flex and AB duct above 90 degrees.  Nonetheless weakness is present.  Pain is her main presenting complaint.  She wants to avoid surgery if possible.  In general I think for her age and problem lower trapezius tendon transfer would be her best option.  She is going to  try physical therapy for 8 weeks here for rotator cuff strengthening and then we will see her back in 8 weeks for clinical recheck to determine whether or not she wants to proceed or discuss surgical intervention further.  I would favor tendon transfer over RSA at this time.  Follow-Up Instructions: No follow-ups on file.   Orders:  Orders Placed This Encounter  Procedures   Ambulatory referral to Physical Therapy   No orders of the defined types were placed in this encounter.     Procedures: No procedures performed   Clinical Data: No additional findings.  Objective: Vital Signs: LMP 03/19/2016   Physical Exam:   Constitutional: Patient appears well-developed HEENT:  Head: Normocephalic Eyes:EOM are normal Neck: Normal range of motion Cardiovascular: Normal rate Pulmonary/chest: Effort normal Neurologic: Patient is alert Skin: Skin is warm Psychiatric: Patient has normal mood and affect   Ortho Exam: Ortho exam demonstrates active forward flexion and AB duction to about 90 degrees.  She does have weakness on the right the infraspinatus and supraspinatus testing but subscap strength is intact.  No coarse grinding or crepitus with internal/external rotation of the shoulder at 90 degrees of abduction.  No radiating pain into the biceps region but she does have positive O'Brien's testing and no discrete AC joint tenderness.  Passive range of motion of the shoulder is 70/100/170.  Specialty Comments:  No specialty comments  available.  Imaging: No results found.   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   Perimenopausal 01/25/2016   DENTAL CARIES 07/05/2010   HEADACHE 03/21/2010   ANXIETY STATE, UNSPECIFIED 02/27/2010   ACID REFLUX DISEASE 02/27/2010   WRIST PAIN, RIGHT 02/27/2010   DIZZINESS 02/27/2010   CHEST PAIN 02/15/2010    HYPERCHOLESTEROLEMIA 05/13/2008   Past Medical History:  Diagnosis Date   Burst fracture of T12 vertebra (HCC) 10/13/2018   Complication of anesthesia    Constipation    Headache    Hypercholesteremia     Family History  Problem Relation Age of Onset   Hypertension Father    Hyperlipidemia Father    Hyperlipidemia Mother    Parkinson's disease Neg Hx     Past Surgical History:  Procedure Laterality Date   CESAREAN SECTION     Social History   Occupational History   Not on file  Tobacco Use   Smoking status: Never   Smokeless tobacco: Never  Vaping Use   Vaping Use: Never used  Substance and Sexual Activity   Alcohol use: Yes    Comment: rare   Drug use: No   Sexual activity: Yes    Birth control/protection: None

## 2021-12-10 NOTE — Progress Notes (Signed)
Called patient with Language line and left VM to call back to set appt

## 2021-12-10 NOTE — Progress Notes (Signed)
Called patient with Language line and left VM to call us back to set appt.

## 2021-12-11 DIAGNOSIS — Z419 Encounter for procedure for purposes other than remedying health state, unspecified: Secondary | ICD-10-CM | POA: Diagnosis not present

## 2021-12-13 DIAGNOSIS — H538 Other visual disturbances: Secondary | ICD-10-CM | POA: Diagnosis not present

## 2022-01-11 DIAGNOSIS — Z419 Encounter for procedure for purposes other than remedying health state, unspecified: Secondary | ICD-10-CM | POA: Diagnosis not present

## 2022-01-13 DIAGNOSIS — H5213 Myopia, bilateral: Secondary | ICD-10-CM | POA: Diagnosis not present

## 2022-01-17 ENCOUNTER — Other Ambulatory Visit: Payer: Self-pay | Admitting: Neurology

## 2022-01-17 DIAGNOSIS — G20 Parkinson's disease: Secondary | ICD-10-CM

## 2022-01-18 ENCOUNTER — Other Ambulatory Visit: Payer: Self-pay

## 2022-01-18 DIAGNOSIS — G20 Parkinson's disease: Secondary | ICD-10-CM

## 2022-01-18 MED ORDER — CARBIDOPA-LEVODOPA 25-100 MG PO TABS: ORAL_TABLET | ORAL | 0 refills | Status: DC

## 2022-02-10 DIAGNOSIS — Z419 Encounter for procedure for purposes other than remedying health state, unspecified: Secondary | ICD-10-CM | POA: Diagnosis not present

## 2022-02-21 ENCOUNTER — Encounter: Payer: Self-pay | Admitting: Nurse Practitioner

## 2022-02-22 ENCOUNTER — Emergency Department (HOSPITAL_COMMUNITY): Payer: Medicaid Other

## 2022-02-22 ENCOUNTER — Other Ambulatory Visit: Payer: Self-pay

## 2022-02-22 ENCOUNTER — Emergency Department (HOSPITAL_COMMUNITY)
Admission: EM | Admit: 2022-02-22 | Discharge: 2022-02-22 | Disposition: A | Discharge status: Home / Self Care | Payer: Medicaid Other | Attending: Emergency Medicine | Admitting: Emergency Medicine

## 2022-02-22 ENCOUNTER — Encounter (HOSPITAL_COMMUNITY): Payer: Self-pay

## 2022-02-22 DIAGNOSIS — G20B2 Parkinson's disease with dyskinesia, with fluctuations: Secondary | ICD-10-CM | POA: Insufficient documentation

## 2022-02-22 DIAGNOSIS — R0602 Shortness of breath: Secondary | ICD-10-CM | POA: Insufficient documentation

## 2022-02-22 DIAGNOSIS — R110 Nausea: Secondary | ICD-10-CM | POA: Diagnosis not present

## 2022-02-22 DIAGNOSIS — R251 Tremor, unspecified: Secondary | ICD-10-CM | POA: Diagnosis not present

## 2022-02-22 DIAGNOSIS — R079 Chest pain, unspecified: Secondary | ICD-10-CM | POA: Insufficient documentation

## 2022-02-22 DIAGNOSIS — G20A1 Parkinson's disease without dyskinesia, without mention of fluctuations: Secondary | ICD-10-CM

## 2022-02-22 DIAGNOSIS — R002 Palpitations: Secondary | ICD-10-CM | POA: Insufficient documentation

## 2022-02-22 DIAGNOSIS — R55 Syncope and collapse: Secondary | ICD-10-CM | POA: Insufficient documentation

## 2022-02-22 DIAGNOSIS — R0789 Other chest pain: Secondary | ICD-10-CM | POA: Diagnosis not present

## 2022-02-22 LAB — CBC WITH DIFFERENTIAL/PLATELET
Abs Immature Granulocytes: 0.02 10*3/uL (ref 0.00–0.07)
Basophils Absolute: 0 10*3/uL (ref 0.0–0.1)
Basophils Relative: 0 %
Eosinophils Absolute: 0.1 10*3/uL (ref 0.0–0.5)
Eosinophils Relative: 2 %
HCT: 34.9 % — ABNORMAL LOW (ref 36.0–46.0)
Hemoglobin: 11.9 g/dL — ABNORMAL LOW (ref 12.0–15.0)
Immature Granulocytes: 0 %
Lymphocytes Relative: 37 %
Lymphs Abs: 2.2 10*3/uL (ref 0.7–4.0)
MCH: 31.6 pg (ref 26.0–34.0)
MCHC: 34.1 g/dL (ref 30.0–36.0)
MCV: 92.6 fL (ref 80.0–100.0)
Monocytes Absolute: 0.3 10*3/uL (ref 0.1–1.0)
Monocytes Relative: 6 %
Neutro Abs: 3.3 10*3/uL (ref 1.7–7.7)
Neutrophils Relative %: 55 %
Platelets: 259 10*3/uL (ref 150–400)
RBC: 3.77 MIL/uL — ABNORMAL LOW (ref 3.87–5.11)
RDW: 13.2 % (ref 11.5–15.5)
WBC: 6 10*3/uL (ref 4.0–10.5)
nRBC: 0 % (ref 0.0–0.2)

## 2022-02-22 LAB — BASIC METABOLIC PANEL
Anion gap: 9 (ref 5–15)
BUN: 12 mg/dL (ref 6–20)
CO2: 23 mmol/L (ref 22–32)
Calcium: 8.7 mg/dL — ABNORMAL LOW (ref 8.9–10.3)
Chloride: 105 mmol/L (ref 98–111)
Creatinine, Ser: 0.62 mg/dL (ref 0.44–1.00)
GFR, Estimated: >60 mL/min (ref >60)
Glucose, Bld: 128 mg/dL — ABNORMAL HIGH (ref 70–99)
Potassium: 3.4 mmol/L — ABNORMAL LOW (ref 3.5–5.1)
Sodium: 137 mmol/L (ref 135–145)

## 2022-02-22 LAB — TROPONIN I (HIGH SENSITIVITY)
Troponin I (High Sensitivity): <2 ng/L (ref <18)
Troponin I (High Sensitivity): <2 ng/L (ref <18)

## 2022-02-22 LAB — D-DIMER, QUANTITATIVE: D-Dimer, Quant: <0.27 ug/mL-FEU (ref 0.00–0.50)

## 2022-02-22 NOTE — ED Triage Notes (Signed)
Pt BIB [facility] from home. Pt presents with CP that radiates to L arm and started last PM. Pt has associated weakness, dizziness, and nausea with dry heaves.   EMS Meds PTA  325 mg ASA 1 NTG with minimal relief 4mg  of Zofran  EMS vitals  121/68 HR 84 SpO2 98% CBG 136

## 2022-02-22 NOTE — ED Triage Notes (Signed)
Pt located pain to L chest and states that she feels like she wants to pass out. Pain is described as a pressure. Pt reports having cold chills and generalized weakness. Pt denies any chest pain during triage. But states the pain has been coming and going quickly.

## 2022-02-22 NOTE — ED Provider Notes (Signed)
[facility] EMERGENCY DEPARTMENT Provider Note   CSN: [id number] Arrival date & time: 02/22/22  1407     History {Add pertinent medical, surgical, social history, OB history to HPI:1} No chief complaint on file.   Marissa Garcia is a 56 y.o. female.  HPI     Home Medications Prior to Admission medications   Medication Sig Start Date End Date Taking? Authorizing Provider  BIPERIDEN HCL PO Take 2 mg by mouth 3 (three) times daily.    [provider]  carbidopa-levodopa (SINEMET IR) 25-100 MG tablet TAKE 2 TABLETS BY MOUTH AT 8 AM, 1 TABLET BY MOUTH AT NOON AND 1 TABLET BY MOUTH AT 4 PM. 01/18/22   Tat, Eustace Quail, DO  carbidopa-levodopa (SINEMET IR) 25-100 MG tablet 2 at 8am, 1 at noon, 1 at 4pm 01/18/22   Jaffe, Adam R, DO  rOPINIRole (REQUIP) 1 MG tablet Take 1 tablet (1 mg total) by mouth 3 (three) times daily. 10/22/21   Tat, Eustace Quail, DO  traZODone (DESYREL) 50 MG tablet Take 1 tablet (50 mg total) by mouth at bedtime. 10/22/21   Tat, Eustace Quail, DO  zolpidem (AMBIEN) 5 MG tablet Take 1 tablet (5 mg total) by mouth at bedtime as needed for sleep. Patient not taking: No sig reported 03/19/16 02/22/19  Boykin Nearing, MD      Allergies    Propofol    Review of Systems   Review of Systems  Physical Exam Updated Vital Signs BP 108/69 (BP Location: Left Arm)   Pulse 66   Resp 16   Ht 4\' 11"  (1.499 m)   Wt 61.2 kg   LMP 03/19/2016   SpO2 97%   BMI 27.27 kg/m  Physical Exam  ED Results / Procedures / Treatments   Labs (all labs ordered are listed, but only abnormal results are displayed) Labs Reviewed  BASIC METABOLIC PANEL - Abnormal; Notable for the following components:      Result Value   Potassium 3.4 (*)    Glucose, Bld 128 (*)    Calcium 8.7 (*)    All other components within normal limits  CBC WITH DIFFERENTIAL/PLATELET - Abnormal; Notable for the following components:   RBC 3.77 (*)    Hemoglobin 11.9 (*)    HCT 34.9 (*)    All  other components within normal limits  D-DIMER, QUANTITATIVE  TROPONIN I (HIGH SENSITIVITY)  TROPONIN I (HIGH SENSITIVITY)    EKG None  Radiology DG Chest 2 View  Result Date: 02/22/2022 CLINICAL DATA:  Shortness of breath EXAM: CHEST - 2 VIEW COMPARISON:  Radiograph 10/13/2018 FINDINGS: Unchanged cardiomediastinal silhouette. There is no focal airspace consolidation. There is no pleural effusion. No pneumothorax. There is a chronic T12 compression fracture which is unchanged. Mild degenerative changes of the spine. IMPRESSION: No evidence of acute cardiopulmonary disease. Unchanged chronic T12 compression deformity. Electronically Signed   By: Maurine Simmering M.D.   On: 02/22/2022 15:44    Procedures Procedures  {Document cardiac monitor, telemetry assessment procedure when appropriate:1}  Medications Ordered in ED Medications - No data to display  ED Course/ Medical Decision Making/ A&P                           Medical Decision Making  ***  {Document critical care time when appropriate:1} {Document review of labs and clinical decision tools ie heart score, Chads2Vasc2 etc:1}  {Document your independent review of radiology images, and any outside  records:1} {Document your discussion with family members, caretakers, and with consultants:1} {Document social determinants of health affecting pt's care:1} {Document your decision making why or why not admission, treatments were needed:1} Final Clinical Impression(s) / ED Diagnoses Final diagnoses:  None    Rx / DC Orders ED Discharge Orders     None

## 2022-02-22 NOTE — Discharge Instructions (Signed)
1.  Try to set your Apple Watch up for heart rate monitoring. 2.  Call your family doctor and [facility] to set up a follow-up appointment.  Ideally a recheck early in the week would be good.  Either your doctor or [facility] can get you set up with a continuous heart monitor. 3.  If you have an episode again of racing heart or chest pain, call 911 when it is happening so you can get help immediately and get a heart tracing of your heart rate while your symptoms are happening. 4.  Return to the emergency department if you have new worsening or concerning symptoms.

## 2022-02-22 NOTE — ED Provider Notes (Signed)
I saw and evaluated the patient, reviewed the resident's note and I agree with the findings and plan.  EKG Interpretation  Date/Time:  Friday February 22 2022 14:18:03 EDT Ventricular Rate:  75 PR Interval:  148 QRS Duration: 86 QT Interval:  380 QTC Calculation: 424 R Axis:   68 Text Interpretation: Normal sinus rhythm Normal ECG When compared with ECG of 20-Nov-2013 15:16, PREVIOUS ECG IS PRESENT normal, no change from previous Confirmed by Charlesetta Shanks [phone]) on 02/22/2022 4:45:48 PM    Charlesetta Shanks, MD 02/24/22 1026

## 2022-02-22 NOTE — ED Provider Triage Note (Signed)
Emergency Medicine Provider Triage Evaluation Note  Kyoko Elsea , a 56 y.o. female  was evaluated in triage.  Pt complains of chest pain shortness of breath started yesterday, came on suddenly, pain is mainly on left side of her chest, is pleuritic, states she feels short of breath, pain does not radiate, states she feels slightly lightheaded but denies any dizziness no near syncope no diaphoresis, she has no cardiac history no history PEs or DVTs currently on hormone therapy..  Review of Systems  Positive: Chest pain, shortness of breath Negative: Leg pain, leg swelling  Physical Exam  BP 110/71 (BP Location: Right Arm)   Pulse 69   Resp 16   LMP 03/19/2016   SpO2 98%  Gen:   Awake, no distress   Resp:  Normal effort  MSK:   Moves extremities without difficulty  Other:    Medical Decision Making  Medically screening exam initiated at 2:22 PM.  Appropriate orders placed.  Aamiyah Derrick was informed that the remainder of the evaluation will be completed by another provider, this initial triage assessment does not replace that evaluation, and the importance of remaining in the ED until their evaluation is complete.  Lab and imaging been ordered will need further work-up.   Marcello Fennel, PA-C 02/22/22 1423

## 2022-02-24 ENCOUNTER — Other Ambulatory Visit: Payer: Self-pay | Admitting: Nurse Practitioner

## 2022-02-24 DIAGNOSIS — Z7689 Persons encountering health services in other specified circumstances: Secondary | ICD-10-CM

## 2022-02-27 ENCOUNTER — Encounter: Payer: Self-pay | Admitting: Physical Therapy

## 2022-02-27 ENCOUNTER — Other Ambulatory Visit: Payer: Self-pay

## 2022-02-27 ENCOUNTER — Ambulatory Visit: Payer: Medicaid Other | Attending: Orthopedic Surgery | Admitting: Physical Therapy

## 2022-02-27 DIAGNOSIS — M6281 Muscle weakness (generalized): Secondary | ICD-10-CM | POA: Insufficient documentation

## 2022-02-27 DIAGNOSIS — M25511 Pain in right shoulder: Secondary | ICD-10-CM | POA: Diagnosis not present

## 2022-02-27 DIAGNOSIS — G8929 Other chronic pain: Secondary | ICD-10-CM | POA: Diagnosis not present

## 2022-02-27 NOTE — Patient Instructions (Signed)
Access Code: [email or web address] URL: [email or web address] Date: 02/27/2022 Prepared by: Hilda Blades  Exercises - Supine Shoulder Press with Dowel  - 2 x daily - 2 sets - 10 reps - Sidelying Shoulder External Rotation  - 2 x daily - 2 sets - 10 reps

## 2022-02-27 NOTE — Therapy (Addendum)
OUTPATIENT PHYSICAL THERAPY EVALUATION  DISCHARGE   Patient Name: Marissa Garcia MRN: [id number] DOB:28-Aug-1965, 56 y.o., female Today's Date: 02/27/2022   PT End of Session - 02/27/22 1335     Visit Number 1    Number of Visits 9    Date for PT Re-Evaluation 04/24/22    Authorization Type [employer]    PT Start Time 3086    PT Stop Time 1300    PT Time Calculation (min) 40 min    Activity Tolerance Patient tolerated treatment well    Behavior During Therapy [facility] for tasks assessed/performed             Past Medical History:  Diagnosis Date   Burst fracture of T12 vertebra ([facility]) 57/84/6962   Complication of anesthesia    Constipation    Headache    Hypercholesteremia    Past Surgical History:  Procedure Laterality Date   CESAREAN SECTION     Patient Active Problem List   Diagnosis Date Noted   Nutritional counseling 07/13/2019   Parkinson's disease 04/22/2019   Atrophic vulvovaginitis 01/25/2016   Perimenopausal 01/25/2016   DENTAL CARIES 07/05/2010   HEADACHE 03/21/2010   ANXIETY STATE, UNSPECIFIED 02/27/2010   ACID REFLUX DISEASE 02/27/2010   WRIST PAIN, RIGHT 02/27/2010   DIZZINESS 02/27/2010   CHEST PAIN 02/15/2010   HYPERCHOLESTEROLEMIA 05/13/2008    PCP: Gildardo Pounds, NP  REFERRING PROVIDER: Meredith Pel, MD  REFERRING DIAG: Chronic right shoulder pain  THERAPY DIAG:  Chronic right shoulder pain  Muscle weakness (generalized)  Rationale for Evaluation and Treatment Rehabilitation  ONSET DATE: June 2020   SUBJECTIVE:   SUBJECTIVE STATEMENT: Patient reports around 2020 she fell at home, and she hurt her right shoulder and now she is unable to lift anything up from shoulder height or higher. She also reports and difficulty raising her arm and pain when trying to lower back down. She also reports pain at night that will keep her from being able to sleep and will wake her up from sleeping. She reports the pain in her shoulder and arm  feels like a broken bone. She did have an injection around 4 months ago that helped for about 2 weeks.   PERTINENT HISTORY: Parkinson Disease  PAIN:  Are you having pain? Yes:  NPRS scale: 6/10 (8/10 when lowering arm from an raised position) Pain location: Right shoulder Pain description: "feels like a broken bone" Aggravating factors: Raising right arm, lifting objects Relieving factors: Supporting right arm  PRECAUTIONS: None  WEIGHT BEARING RESTRICTIONS: No  FALLS:  Has patient fallen in last 6 months? No  LIVING ENVIRONMENT: Lives with: lives with their family Lives in: House/apartment  PLOF: Independent  PATIENT GOALS: Reduce pain with activity and at night to improve sleeping   OBJECTIVE:  DIAGNOSTIC FINDINGS:  MRI right shoulder 06/06/2021 IMPRESSION: 1. 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 Moderate to high-grade supraspinatus and moderate anterior infraspinatus muscle atrophy. 2. Mild proximal long head of the biceps tendinosis. 3. Moderate acromioclavicular osteoarthritis.  PATIENT SURVEYS:  Quick Dash 68.2% disability  COGNITION: Overall cognitive status: Within functional limits for tasks assessed     SENSATION: WFL  POSTURE: Mild rounded shoulder posture, patient does exhibit resting tremor bilaterally secondary to history of Parkinson Disease  UPPER EXTREMITY ROM:   Active ROM Right eval Left eval  Shoulder flexion 120 160  Shoulder abduction 130 160  Shoulder internal rotation Reach  to L1 T5  Shoulder external rotation Reach to C7 T4   Patient requires use of opposite arm to assist with lowering from elevated position due to pain and weakness  UPPER EXTREMITY MMT:  MMT Right eval Left eval  Shoulder flexion 4- -  Shoulder extension 4- -  Shoulder abduction 4- -  Shoulder internal rotation 4 -  Shoulder external rotation 2 -  Middle trapezius  - -  Lower trapezius - -  Elbow flexion 5 -  Elbow extension 5 -   SHOULDER SPECIAL TESTS: Not assessed  JOINT MOBILITY TESTING:  Not assessed  PALPATION:  Tender to lateral shoulder and upper trap region    TODAY'S TREATMENT: [facility] Adult PT Treatment:                                                DATE: 02/27/2022 Therapeutic Exercise: Supine dowel press to overhead reach 2 x 10 Sidelying ER 2 x 10  PATIENT EDUCATION: Education details: Exam findings, POC, HEP Person educated: Patient Education method: Explanation, Demonstration, Tactile cues, Verbal cues, and Handouts Education comprehension: verbalized understanding, returned demonstration, verbal cues required, tactile cues required, and needs further education  HOME EXERCISE PROGRAM: Access Code: [email or web address]   ASSESSMENT: CLINICAL IMPRESSION: Patient is a 56 y.o. female who was seen today for physical therapy evaluation and treatment for chronic right shoulder pain and weakness. She exhibits significant limitations with right shoulder motion and strength that are consistent with MRI confirmed supraspinatus and infraspinatus tendon tear. She reports pain with activity and night pain that severely affects her sleeping ability. Exercises provided to initiate AAROM to AROM in gravity reduce position.   OBJECTIVE IMPAIRMENTS: decreased activity tolerance, decreased ROM, decreased strength, impaired UE functional use, postural dysfunction, and pain.   ACTIVITY LIMITATIONS: lifting, sleeping, dressing, reach over head, and hygiene/grooming  PARTICIPATION LIMITATIONS: meal prep, cleaning, and occupation  PERSONAL FACTORS: Past/current experiences, Time since onset of injury/illness/exacerbation, and 3+ comorbidities: see PMH above  are also affecting patient's functional outcome.   REHAB POTENTIAL: Fair  CLINICAL DECISION MAKING: Stable/uncomplicated  EVALUATION COMPLEXITY: Low   GOALS: Goals reviewed with patient?  Yes  SHORT TERM GOALS: Target date: 03/27/2022  Patient will be I with initial HEP in order to progress with therapy. Baseline: HEP provided at eval Goal status: INITIAL  2.  Patient will report </= 5/10 right shoulder pain with raising shoulder overhead in order to reduce limitations with dressing and self care tasks Baseline: 8/10 pain Goal status: INITIAL  LONG TERM GOALS: Target date: 04/24/2022   Patient will be I with final HEP to maintain progress from PT. Baseline: HEP provided at eval Goal status: INITIAL  2.  Patient will report QuickDASH </= 50% disability in order to indicate improved functional ability Baseline: 68.2% disability Goal status: INITIAL  3.  Patient will demonstrate right shoulder elevation AROM >/= 145 deg without needling opposite arm to assist lower to improve overhead reach and performing cleaning tasks Baseline: 120 deg shoulder elevation and requires use of opposite arm to assist with lowering due to pain Goal status: INITIAL  4.  Patient will report right shoulder pain at night </= 3/10 in order to improve ability to sleep and remain asleep for general health and wellbeing  Baseline: patient report >/= 6/10 pain at night that severely affects sleeping ability Goal status: INITIAL  PLAN: PT FREQUENCY: 1x/week  PT DURATION: 8 weeks  PLANNED INTERVENTIONS: Therapeutic exercises, Therapeutic activity, Neuromuscular re-education, Balance training, Gait training, Patient/Family education, Self Care, Joint mobilization, Joint manipulation, Aquatic Therapy, Dry Needling, Cryotherapy, Moist heat, Taping, Manual therapy, and Re-evaluation  PLAN FOR NEXT SESSION: Review HEP and progress PRN, right shoulder AAROM to AROM as tolerated, right shoulder and parascapular strengthening as tolerated   Hilda Blades, PT, DPT, LAT, ATC 02/27/22  1:58 PM Phone: [phone] Fax: [phone]    [employer] Authorization   Choose one:  Rehabilitative  Standardized Assessment or Functional Outcome Tool: See Pain Assessment and Quick DASH  Score or Percent Disability: 68.2%  Body Parts Treated (Select each separately):  Shoulder. Overall deficits/functional limitations for body part selected: severe  If treatment provided at initial evaluation, no treatment charged due to lack of authorization.    PHYSICAL THERAPY DISCHARGE SUMMARY  Visits from Start of Care: 1  Current functional level related to goals / functional outcomes: See above   Remaining deficits: See above   Education / Equipment: HEP   Patient agrees to discharge. Patient goals were not met. Patient is being discharged due to not returning since the last visit.  Hilda Blades, PT, DPT, LAT, ATC 03/25/22  10:43 AM Phone: [phone] Fax: [phone]

## 2022-03-08 ENCOUNTER — Emergency Department (HOSPITAL_COMMUNITY): Payer: Worker's Compensation

## 2022-03-08 ENCOUNTER — Encounter (HOSPITAL_COMMUNITY): Payer: Self-pay | Admitting: Emergency Medicine

## 2022-03-08 ENCOUNTER — Emergency Department (HOSPITAL_COMMUNITY)
Admission: EM | Admit: 2022-03-08 | Discharge: 2022-03-09 | Disposition: A | Discharge status: Home / Self Care | Payer: Worker's Compensation | Attending: Emergency Medicine | Admitting: Emergency Medicine

## 2022-03-08 DIAGNOSIS — R531 Weakness: Secondary | ICD-10-CM | POA: Insufficient documentation

## 2022-03-08 DIAGNOSIS — G20C Parkinsonism, unspecified: Secondary | ICD-10-CM | POA: Diagnosis not present

## 2022-03-08 DIAGNOSIS — Y9301 Activity, walking, marching and hiking: Secondary | ICD-10-CM | POA: Insufficient documentation

## 2022-03-08 DIAGNOSIS — M545 Low back pain, unspecified: Secondary | ICD-10-CM | POA: Insufficient documentation

## 2022-03-08 DIAGNOSIS — W010XXA Fall on same level from slipping, tripping and stumbling without subsequent striking against object, initial encounter: Secondary | ICD-10-CM | POA: Insufficient documentation

## 2022-03-08 DIAGNOSIS — Z79899 Other long term (current) drug therapy: Secondary | ICD-10-CM | POA: Diagnosis not present

## 2022-03-08 DIAGNOSIS — W19XXXA Unspecified fall, initial encounter: Secondary | ICD-10-CM

## 2022-03-08 HISTORY — DX: Parkinson's disease without dyskinesia, without mention of fluctuations: G20.A1

## 2022-03-08 LAB — PREGNANCY, URINE: Preg Test, Ur: NEGATIVE

## 2022-03-08 NOTE — ED Provider Triage Note (Signed)
Emergency Medicine Provider Triage Evaluation Note  Marissa Garcia , a 56 y.o. female  was evaluated in triage.  Pt complains of back pain. Fell Monday due to slipping on water.     Review of Systems  Positive: Hip pain, back pain Negative: Fever   Physical Exam  BP 116/75 (BP Location: Left Arm)   Pulse 65   Temp 97.8 F (36.6 C) (Oral)   Resp 16   LMP 03/19/2016   SpO2 100%  Gen:   Awake, no distress   Resp:  Normal effort  MSK:   Moves extremities without difficulty  Other:  L/T spine TTP R hip TTP  Medical Decision Making  Medically screening exam initiated at 6:47 PM.  Appropriate orders placed.  Fatoumata Albaugh was informed that the remainder of the evaluation will be completed by another provider, this initial triage assessment does not replace that evaluation, and the importance of remaining in the ED until their evaluation is complete.  [address]   Pati Gallo [place], [place] 03/08/22 1850

## 2022-03-08 NOTE — ED Triage Notes (Signed)
Patient told by her worker's comp evaluator told her to go to an emergency department for evaluation of a possible T12 compression fracture after slipping on water and falling on Monday this week.

## 2022-03-09 ENCOUNTER — Emergency Department (HOSPITAL_COMMUNITY): Payer: Medicaid Other

## 2022-03-09 LAB — I-STAT CHEM 8, ED
BUN: 17 mg/dL (ref 6–20)
Calcium, Ion: 1.21 mmol/L (ref 1.15–1.40)
Chloride: 105 mmol/L (ref 98–111)
Creatinine, Ser: 0.5 mg/dL (ref 0.44–1.00)
Glucose, Bld: 99 mg/dL (ref 70–99)
HCT: 36 % (ref 36.0–46.0)
Hemoglobin: 12.2 g/dL (ref 12.0–15.0)
Potassium: 4.1 mmol/L (ref 3.5–5.1)
Sodium: 139 mmol/L (ref 135–145)
TCO2: 25 mmol/L (ref 22–32)

## 2022-03-09 MED ORDER — GADOBUTROL 1 MMOL/ML IV SOLN
6.0000 mL | Freq: Once | INTRAVENOUS | Status: AC | PRN
  Administered 2022-03-09: 6 mL via INTRAVENOUS

## 2022-03-09 MED ORDER — KETOROLAC TROMETHAMINE 15 MG/ML IJ SOLN
15.0000 mg | Freq: Once | INTRAMUSCULAR | Status: AC
  Administered 2022-03-09: 15 mg via INTRAVENOUS
  Filled 2022-03-09: qty 1

## 2022-03-09 MED ORDER — CARBIDOPA-LEVODOPA 25-100 MG PO TABS
2.0000 | ORAL_TABLET | Freq: Once | ORAL | Status: AC
  Administered 2022-03-09: 2 via ORAL
  Filled 2022-03-09: qty 2

## 2022-03-09 MED ORDER — DEXAMETHASONE SODIUM PHOSPHATE 10 MG/ML IJ SOLN
10.0000 mg | Freq: Once | INTRAMUSCULAR | Status: AC
  Administered 2022-03-09: 10 mg via INTRAVENOUS
  Filled 2022-03-09: qty 1

## 2022-03-09 NOTE — Discharge Instructions (Signed)
Usted fue atendido [employer] en el departamento de emergencias por una cada. Mientras estuvo aqu, le hicimos una tomografa computarizada y Ardelia Mems resonancia magntica de la espalda. Tiene una antigua fractura de la columna torcica (T12) y mltiples cambios degenerativos en los discos de la zona lumbar. Es probable que su cada haya empeorado su dolor lumbar debido a estos cambios. Debe ser evaluado por un cirujano de columna con respecto a estos cambios y Conservation officer, historic buildings que est teniendo. Si hay trabajo disponible para brindarle fisioterapia, esta debe solicitarse para ayudar en su recuperacin. Por favor toma ibuprofeno y descansa. Por favor regrese si la debilidad en sus piernas empeora y le causa cadas, si no puede orinar o defecar o si tiene incontinencia.  You were seen in the emergency department today for a fall. While you were here we did a CT and MRI of your back. You have an old fracture of your thoracic spine (T12) and multiple degenerative changes to the discs in your low back. You fall likely worsened your low back pain from these changes. You should be evaluated by a spine surgeon regarding these changes and the pain you are having. If work is available to provide you with physical therapy this should be ordered to help your recovery. Please take ibuprofen and rest. Please return for worsening weakness in your legs causing you to fall, if you are unable to pee or have a bowel movement or you are incontinent.

## 2022-03-09 NOTE — ED Provider Notes (Signed)
[facility] EMERGENCY DEPARTMENT Provider Note   CSN: [id number] Arrival date & time: 03/08/22  1624     History  Chief Complaint  Patient presents with   Marissa Garcia is a 56 y.o. female. With past medical history of spinal fracture, headache, hypercholesterolemia, Parkinson's disease who presents to the emergency department with fall.   States she had fall on Monday. States she was walking inside and the floor was wet. Fell backward onto her back. Since then she has been having low back pain. States the pain does not radiate. She describes having weakness in her lower extremities. States that as she is walking, feels like her body is becoming heavier. She has not had repeated fall since Monday. Additionally, states her legs feel numb. She denies saddle anesthesia, bowel/bladder incontinence/retention. Denies striking her head or loss of consciousness. Not anticoagulated. Has history of T12 compression fracture from previous fall. Has history of Parkinson's and does not use assistive devices for walking.  HPI     Home Medications Prior to Admission medications   Medication Sig Start Date End Date Taking? Authorizing Provider  BIPERIDEN HCL PO Take 2 mg by mouth 3 (three) times daily.    [provider]  carbidopa-levodopa (SINEMET IR) 25-100 MG tablet TAKE 2 TABLETS BY MOUTH AT 8 AM, 1 TABLET BY MOUTH AT NOON AND 1 TABLET BY MOUTH AT 4 PM. 01/18/22   Tat, Octaviano Batty, DO  carbidopa-levodopa (SINEMET IR) 25-100 MG tablet 2 at 8am, 1 at noon, 1 at 4pm 01/18/22   Jaffe, Adam R, DO  rOPINIRole (REQUIP) 1 MG tablet Take 1 tablet (1 mg total) by mouth 3 (three) times daily. 10/22/21   Tat, Octaviano Batty, DO  traZODone (DESYREL) 50 MG tablet Take 1 tablet (50 mg total) by mouth at bedtime. 10/22/21   Tat, Octaviano Batty, DO  zolpidem (AMBIEN) 5 MG tablet Take 1 tablet (5 mg total) by mouth at bedtime as needed for sleep. Patient not taking: No sig reported 03/19/16 02/22/19   Dessa Phi, MD      Allergies    Propofol    Review of Systems   Review of Systems  Musculoskeletal:  Positive for back pain and gait problem.  All other systems reviewed and are negative.   Physical Exam Updated Vital Signs BP 111/83 (BP Location: Left Arm)   Pulse 61   Temp 98.6 F (37 C) (Oral)   Resp 16   LMP 03/19/2016   SpO2 98%  Physical Exam Vitals and nursing note reviewed.  Constitutional:      General: She is not in acute distress.    Appearance: Normal appearance. She is normal weight. She is not toxic-appearing.  HENT:     Head: Normocephalic and atraumatic.     Mouth/Throat:     Mouth: Mucous membranes are moist.     Pharynx: Oropharynx is clear.  Eyes:     General: No scleral icterus.    Extraocular Movements: Extraocular movements intact.  Cardiovascular:     Rate and Rhythm: Normal rate and regular rhythm.     Pulses: Normal pulses.  Pulmonary:     Effort: Pulmonary effort is normal. No respiratory distress.  Abdominal:     Palpations: Abdomen is soft.  Musculoskeletal:     Cervical back: Normal range of motion and neck supple. No tenderness or bony tenderness.     Thoracic back: No bony tenderness.     Lumbar back: Bony tenderness present.  Decreased range of motion.       Back:  Skin:    General: Skin is warm and dry.     Capillary Refill: Capillary refill takes less than 2 seconds.  Neurological:     General: No focal deficit present.     Mental Status: She is alert and oriented to person, place, and time.     Sensory: No sensory deficit.     Motor: Weakness present.     Comments: Baseline tremor in BUE/BLE (Parkinson's) Strength 3/5 BLE, DP pulse 2+, sensation intact   Psychiatric:        Mood and Affect: Mood normal.        Behavior: Behavior normal.        Thought Content: Thought content normal.        Judgment: Judgment normal.     ED Results / Procedures / Treatments   Labs (all labs ordered are listed, but only  abnormal results are displayed) Labs Reviewed  PREGNANCY, URINE  I-STAT CHEM 8, ED    EKG None  Radiology MR Lumbar Spine W Wo Contrast (assess for abscess, cord compression)  Result Date: 03/09/2022 CLINICAL DATA:  56 year old female with low back pain after a fall. Chronic T12 compression fracture. EXAM: MRI LUMBAR SPINE WITHOUT AND WITH CONTRAST TECHNIQUE: Multiplanar and multiecho pulse sequences of the lumbar spine were obtained without and with intravenous contrast. CONTRAST:  26mL GADAVIST GADOBUTROL 1 MMOL/ML IV SOLN COMPARISON:  Lumbar spine CT yesterday.  Lumbar MRI 06/15/2019. FINDINGS: Segmentation: Designated as normal on prior exams, which is seems hypoplastic ribs at T12. Alignment: Stable lumbar lordosis since 2021 minimal lumbar scoliosis. No significant spondylolisthesis. Vertebrae: Chronic T12 superior endplate compression with mild retropulsion of the posterosuperior endplate appears stable since the 2021 MRI. Normal background bone marrow signal. No marrow edema or evidence of acute osseous abnormality. Intact visible sacrum and SI joints. Conus medullaris and cauda equina: Chronic spinal stenosis at the conus medullaris related to T12 retropulsion appears stable since 2021 and mild although with up to mild conus mass effect. No lower spinal cord or conus signal abnormality. No abnormal intradural enhancement or dural thickening. Cauda equina nerve roots appear stable since 2021. Paraspinal and other soft tissues: Stable and negative. Disc levels: T10-T11: Grossly stable and negative. T11-T12: Posterior disc osteophyte complex in conjunction with T12 compression fracture with stable mild spinal stenosis as detailed above. T12-L1:  Negative. L1-L2:  Negative disc.  Mild facet hypertrophy.  No stenosis. L2-L3:  Negative. L3-L4: Mild circumferential disc bulge. Mild to moderate facet and mild ligament flavum hypertrophy. Borderline to mild spinal stenosis here appears stable or improved  since 2021. No convincing lateral recess or foraminal stenosis. L4-L5: Relatively preserved disc space height but chronic circumferential disc bulging here with mild endplate spurring. Moderate facet and ligament flavum hypertrophy. Mild epidural lipomatosis. Moderate to severe spinal stenosis appears stable since 2021 (series 5, image 31) with symmetric lateral recess involvement. Only mild L4 neural foraminal stenosis. L5-S1: Chronic disc desiccation and left paracentral annular fissure of the disc (series 5, image 36). A small left paracentral disc protrusion there has progressed. No spinal stenosis. Disc effaces the descending S1 nerve roots in the lateral recesses, more so the left. No foraminal stenosis. IMPRESSION: 1. No acute osseous abnormality in the lumbar spine. Stable chronic T12 compression fracture and associated mild chronic spinal stenosis at the conus medullaris. 2. Chronic L4-L5 multifactorial moderate to severe spinal stenosis has not significantly changed, along with borderline to  mild spinal stenosis at L3-L4 since 2021. 3. L5-S1 disc degeneration has regressed since the 2021 MRI, but residual disc and annular fissure might be a source of S1 radiculitis. Electronically Signed   By: Odessa Fleming M.D.   On: 03/09/2022 09:12   CT Lumbar Spine Wo Contrast  Result Date: 03/08/2022 CLINICAL DATA:  Lower back pain. Slipped on water 4 days ago. Known chronic T12 vertebral body compression fracture. EXAM: CT LUMBAR SPINE WITHOUT CONTRAST TECHNIQUE: Multidetector CT imaging of the lumbar spine was performed without intravenous contrast administration. Multiplanar CT image reconstructions were also generated. RADIATION DOSE REDUCTION: This exam was performed according to the departmental dose-optimization program which includes automated exposure control, adjustment of the mA and/or kV according to patient size and/or use of iterative reconstruction technique. COMPARISON:  Chest two views 02/22/2022, MRI  lumbar spine 06/15/2019, CT chest, abdomen, and pelvis 10/13/2018 FINDINGS: Segmentation: Standard. Alignment: No sagittal spondylolisthesis. Unchanged 4 mm retropulsion of the posterosuperior T12 vertebral body. Vertebrae: There is mild-to-moderate anterior left-greater-than-right T12 vertebral body height loss, measuring up to approximately 30% at the anterior left T12 vertebral body and 20-25% at the anterior right T12 vertebral body. This is unchanged from 06/15/2019 MRI and was seen acutely on 10/13/2018 CT. No acute fracture is seen. The bilateral sacroiliac joint spaces are maintained. Paraspinal and other soft tissues: There is a peripherally calcified gallstone measuring up to 2.6 cm. Multiple similar gallstones were seen on prior 10/13/2018 CT. Disc levels: Please see contemporaneous CT thoracic spine report for evaluation of the lower thoracic levels. L1-2: No posterior disc bulge, central canal narrowing, or neuroforaminal stenosis. L2-3: No posterior disc bulge, central canal narrowing, or neuroforaminal stenosis. L3-4: Minimal posterior disc bulge. No central canal or neuroforaminal stenosis. L4-5: Moderate bilateral facet joint hypertrophy. Mild broad-based posterior disc bulge. Mild left intraforaminal disc extension. Mild left greater than right neuroforaminal stenosis is similar to prior. Moderate to severe central canal stenosis is unchanged. L5-S1: Mild-to-moderate right-greater-than-left facet joint hypertrophy. Mild broad-based posterior disc bulge. Mild-to-moderate left-greater-than-right central canal stenosis. No neuroforaminal stenosis. IMPRESSION: 1. No acute fracture is seen. 2. Chronic T12 vertebral body compression fracture. 3. Multilevel degenerative disc and joint changes as above. 4. L4-5 moderate to severe central canal stenosis and mild left greater than right neuroforaminal stenosis. 5. L5-S1 mild-to-moderate left-greater-than-right central canal stenosis. Electronically Signed    By: Neita Garnet M.D.   On: 03/08/2022 20:17   CT Thoracic Spine Wo Contrast  Result Date: 03/08/2022 CLINICAL DATA:  Mid back pain. Possible T10 compression fracture after slipping on water and falling 4 days ago. EXAM: CT THORACIC SPINE WITHOUT CONTRAST TECHNIQUE: Multidetector CT images of the thoracic were obtained using the standard protocol without intravenous contrast. RADIATION DOSE REDUCTION: This exam was performed according to the departmental dose-optimization program which includes automated exposure control, adjustment of the mA and/or kV according to patient size and/or use of iterative reconstruction technique. COMPARISON:  Chest two views 02/22/2022, thoracic spine radiographs 06/08/2021, CT chest, abdomen, and pelvis 10/13/2018, MRI thoracic spine 06/15/2019 FINDINGS: Alignment: No sagittal spondylolisthesis. 4 mm retropulsion of the posterosuperior T12 vertebral body, unchanged from 06/15/2019 MRI. Vertebrae: There is mild-to-moderate anterior left-greater-than-right T12 vertebral body height loss, measuring up to approximately 30% at the anterior left T12 vertebral body and 20-25% at the anterior right T12 vertebral body. This is unchanged from 06/15/2019 MRI and was seen acutely on 10/13/2018 CT. No acute fracture is seen. Paraspinal and other soft tissues: Negative. Disc levels: T4-5: Left facet  joint intraforaminal spurring contributes to mild-to-moderate left neuroforaminal stenosis. T6-7: Right facet joint intraforaminal spurring contributes to borderline mild right neuroforaminal stenosis. T11-12: Mild retropulsion of the posterosuperior T12 vertebral body, chronic. Mild central canal stenosis. No significant neuroforaminal stenosis. No significant change from 06/15/2019 MRI. IMPRESSION: 1. Mild-to-moderate anterior left-greater-than-right T12 vertebral body height loss, unchanged from 06/15/2019 MRI. This was seen acutely on 10/13/2018 CT. No acute vertebral body compression  fracture. 2. Mild retropulsion of the posterosuperior T12 vertebral body with mild T11-12 central canal stenosis, unchanged from 06/15/2019 MRI. Electronically Signed   By: Yvonne Kendall M.D.   On: 03/08/2022 20:09   DG Hip Unilat W or Wo Pelvis 2-3 Views Right  Result Date: 03/08/2022 CLINICAL DATA:  Right hip pain after fall 4 days prior. EXAM: DG HIP (WITH OR WITHOUT PELVIS) 2-3V RIGHT COMPARISON:  CT abdomen and pelvis 10/13/2018 FINDINGS: Normal bone mineralization. The bilateral sacroiliac joint spaces are maintained. Mild pubic symphysis and bilateral femoroacetabular joint space narrowing. Mild chronic degenerative cysts at the anterior superior right femoral head-neck junction. No acute fracture is seen. No dislocation. IMPRESSION: 1. No acute fracture. 2. Mild bilateral femoroacetabular osteoarthritis. Electronically Signed   By: Yvonne Kendall M.D.   On: 03/08/2022 19:49    Procedures Procedures   Medications Ordered in ED Medications  dexamethasone (DECADRON) injection 10 mg (10 mg Intravenous Given 03/09/22 0807)  ketorolac (TORADOL) 15 MG/ML injection 15 mg (15 mg Intravenous Given 03/09/22 0805)  carbidopa-levodopa (SINEMET IR) 25-100 MG per tablet immediate release 2 tablet (2 tablets Oral Given 03/09/22 0821)  gadobutrol (GADAVIST) 1 MMOL/ML injection 6 mL (6 mLs Intravenous Contrast Given 03/09/22 0855)    ED Course/ Medical Decision Making/ A&P                           Medical Decision Making Amount and/or Complexity of Data Reviewed Radiology: ordered.  Risk Prescription drug management.  This patient presents to the ED with chief complaint(s) of back pain with pertinent past medical history of T12 compression fracture which further complicates the presenting complaint. The complaint involves an extensive differential diagnosis and also carries with it a high risk of complications and morbidity.    The differential diagnosis includes Fracture, subluxation,  musculoskeletal strain, epidural abscess, cauda equina, muscle spasm, sciatica or radiculopathy, etc.     Additional history obtained: Additional history obtained from family Records reviewed previous admission documents, Care Everywhere/External Records, and Primary Care Documents  ED Course and Reassessment: 56 year old female who presents to the emergency department with back pain after fall.  On her initial exam she had a right hip plain film as well as a CT of her T and L-spine.  The plain film of the right hip was normal.  The thoracic spine shows a old T12 compression fracture and she had some degenerative changes to the L-spine.  On my exam she is somewhat weak in her lower extremities and complains of her legs feeling heavier.  Felt it prudent to assess for any cauda equina.  Obtain MR of her lumbar spine which noted stable cauda equina.  There are some multilevel degenerative changes and severe canal stenosis which is likely causing her symptoms.  Fall likely exacerbated the symptoms.  There are no new acute findings.  Additionally, no fever or IV drug use concerning for epidural abscess.  No radiculopathy present.  She is likely having some muscle spasm.  Instructed her to use anti-inflammatories.  Given  Decadron here in the emergency department.  Will refer her to spine surgery to evaluate her findings on MRI should she need any intervention for this.  Additionally she states that her work is helping to set her up with physical therapy.  She will need physical therapy to improve her symptoms.  Discussed return precautions and she verbalized understanding.  Otherwise feel that she is safe for discharge at this time.  Independent labs interpretation:  The following labs were independently interpreted: nl  Independent visualization of imaging: - I independently visualized the following imaging with scope of interpretation limited to determining acute life threatening conditions related  to emergency care: Pertinently, MRI of the lumbar spine shows chronic T12 compression fracture and multilevel lumbar disc bulging and spinal stenosis.  The cauda equina appears stable  Consultation: - Consulted or discussed management/test interpretation w/ external professional: Not indicated  Consideration for admission or further workup: Not indicated Social Determinants of health: None identified Final Clinical Impression(s) / ED Diagnoses Final diagnoses:  Fall, initial encounter    Rx / DC Orders ED Discharge Orders          Ordered    Ambulatory referral to Spine Surgery        03/09/22 0931              Cristopher PeruAutry, Martisha Toulouse E, PA-C 03/09/22 [id number]    Mardene SayerBranham, Victoria C, MD 03/09/22 1023

## 2022-03-09 NOTE — ED Notes (Signed)
Patient transported to MRI 

## 2022-03-11 ENCOUNTER — Encounter: Payer: Self-pay | Admitting: Nurse Practitioner

## 2022-03-11 ENCOUNTER — Ambulatory Visit: Payer: Medicaid Other | Attending: Family Medicine | Admitting: Nurse Practitioner

## 2022-03-11 VITALS — BP 115/73 | HR 70 | Temp 98.0°F | Wt 137.2 lb

## 2022-03-11 DIAGNOSIS — Z7689 Persons encountering health services in other specified circumstances: Secondary | ICD-10-CM | POA: Diagnosis not present

## 2022-03-11 DIAGNOSIS — R002 Palpitations: Secondary | ICD-10-CM

## 2022-03-11 DIAGNOSIS — Z1231 Encounter for screening mammogram for malignant neoplasm of breast: Secondary | ICD-10-CM

## 2022-03-11 DIAGNOSIS — E785 Hyperlipidemia, unspecified: Secondary | ICD-10-CM | POA: Diagnosis not present

## 2022-03-11 DIAGNOSIS — R7303 Prediabetes: Secondary | ICD-10-CM | POA: Diagnosis not present

## 2022-03-11 DIAGNOSIS — Z1211 Encounter for screening for malignant neoplasm of colon: Secondary | ICD-10-CM

## 2022-03-11 NOTE — Progress Notes (Signed)
Assessment & Plan:  Marissa Garcia was seen today for establish care.  Diagnoses and all orders for this visit:  Encounter to establish care  Breast cancer screening by mammogram -     MM DIGITAL SCREENING BILATERAL; Future  Colon cancer screening -     Ambulatory referral to Gastroenterology  Dyslipidemia, goal LDL below 100 -     Lipid panel  Prediabetes -     Hemoglobin A1c  Heart palpitations    Patient has been counseled on age-appropriate routine health concerns for screening and prevention. These are reviewed and up-to-date. Referrals have been placed accordingly. Immunizations are up-to-date or declined.    Subjective:   Chief Complaint  Patient presents with   Establish Care   HPI [name] 56 y.o. female presents to office today to establish care.    She has a past medical history of Burst fracture of T12 vertebra ((10/13/2018), Constipation, Headache, Hypercholesteremia, and Parkinson's disease (Followed by Neurology)  She is currently under workman's comp for a fall she sustained while working. Currently with low back pain which is aggravated by prolonged sitting, standing and lying down. She has an upcoming appt with the "spine specialist" soon.   Patient has been counseled on age-appropriate routine health concerns for screening and prevention. These are reviewed and up-to-date. Referrals have been placed accordingly. Immunizations are up-to-date or declined.     MAMMOGRAM: OVERDUE; referred today COLONOSCOPY: overdue; referred today   Palpitations Patient complains of infrequent intermittent episodes of  dizziness, palpitations, and shortness of breath.  The symptoms are of moderate severity and lasting several minutes per episode. Cardiac risk factors include: none. Aggravating factors: caffeine. Relieving factors: spontaneous. Associated signs and symptoms: none. The last occurrence was on 02-22-2022 and she was evaluated in the ED. Work up was essentially  normal and she was instructed to follow up with Cardiology OP for ZIO patch monitoring. EKG nml. Labs essentially normal. CXR normal. She would like to hold off on Cardiology referral at this time. She does endorse caffeine intake and will try to reduce or eliminate this.    Prediabetes Well controlled with diet at this time.    Review of Systems  Constitutional:  Negative for fever, malaise/fatigue and weight loss.  HENT: Negative.  Negative for nosebleeds.   Eyes: Negative.  Negative for blurred vision, double vision and photophobia.  Respiratory: Negative.  Negative for cough and shortness of breath.   Cardiovascular:  Positive for palpitations. Negative for chest pain and leg swelling.  Gastrointestinal: Negative.  Negative for heartburn, nausea and vomiting.  Musculoskeletal: Negative.  Negative for myalgias.  Neurological: Negative.  Negative for dizziness, focal weakness, seizures and headaches.  Psychiatric/Behavioral: Negative.  Negative for suicidal ideas.     Past Medical History:  Diagnosis Date   Burst fracture of T12 vertebra (HCC) 10/13/2018   Complication of anesthesia    Constipation    Headache    Hypercholesteremia    Parkinson's disease     Past Surgical History:  Procedure Laterality Date   CESAREAN SECTION      Family History  Problem Relation Age of Onset   Hypertension Father    Hyperlipidemia Father    Hyperlipidemia Mother    Parkinson's disease Neg Hx     Social History Reviewed with no changes to be made today.   Outpatient Medications Prior to Visit  Medication Sig Dispense Refill   carbidopa-levodopa (SINEMET IR) 25-100 MG tablet TAKE 2 TABLETS BY MOUTH AT 8 AM, 1  TABLET BY MOUTH AT NOON AND 1 TABLET BY MOUTH AT 4 PM. 360 tablet 0   carbidopa-levodopa (SINEMET IR) 25-100 MG tablet 2 at 8am, 1 at noon, 1 at 4pm 360 tablet 0   methocarbamol (ROBAXIN) 500 MG tablet Take 500 mg by mouth 3 (three) times daily as needed.     rOPINIRole (REQUIP)  1 MG tablet Take 1 tablet (1 mg total) by mouth 3 (three) times daily. 270 tablet 1   traZODone (DESYREL) 50 MG tablet Take 1 tablet (50 mg total) by mouth at bedtime. 90 tablet 1   BIPERIDEN HCL PO Take 2 mg by mouth 3 (three) times daily. (Patient not taking: Reported on 03/11/2022)     No facility-administered medications prior to visit.    Allergies  Allergen Reactions   Propofol        Objective:    BP 115/73   Pulse 70   Temp 98 F (36.7 C) (Temporal)   Wt 137 lb 3.2 oz (62.2 kg)   LMP 03/19/2016   SpO2 98%   BMI 27.71 kg/m  Wt Readings from Last 3 Encounters:  03/11/22 137 lb 3.2 oz (62.2 kg)  02/22/22 135 lb (61.2 kg)  10/30/21 138 lb (62.6 kg)    Physical Exam Vitals and nursing note reviewed.  Constitutional:      Appearance: She is well-developed.  HENT:     Head: Normocephalic and atraumatic.  Cardiovascular:     Rate and Rhythm: Normal rate and regular rhythm.     Heart sounds: Normal heart sounds. No murmur heard.    No friction rub. No gallop.  Pulmonary:     Effort: Pulmonary effort is normal. No tachypnea or respiratory distress.     Breath sounds: Normal breath sounds. No decreased breath sounds, wheezing, rhonchi or rales.  Chest:     Chest wall: No tenderness.  Abdominal:     General: Bowel sounds are normal.     Palpations: Abdomen is soft.  Musculoskeletal:        General: Normal range of motion.     Cervical back: Normal range of motion.  Skin:    General: Skin is warm and dry.  Neurological:     Mental Status: She is alert and oriented to person, place, and time.     Coordination: Coordination normal.  Psychiatric:        Behavior: Behavior normal. Behavior is cooperative.        Thought Content: Thought content normal.        Judgment: Judgment normal.          Patient has been counseled extensively about nutrition and exercise as well as the importance of adherence with medications and regular follow-up. The patient was given  clear instructions to go to ER or return to medical center if symptoms don't improve, worsen or new problems develop. The patient verbalized understanding.   Follow-up: Return for physical in january or february .   Gildardo Pounds, FNP-BC [facility] [place], [place]   03/11/2022, 2:18 PM

## 2022-03-12 ENCOUNTER — Ambulatory Visit: Payer: Medicaid Other

## 2022-03-12 LAB — LIPID PANEL
Chol/HDL Ratio: 3.4 ratio (ref 0.0–4.4)
Cholesterol, Total: 203 mg/dL — ABNORMAL HIGH (ref 100–199)
HDL: 60 mg/dL (ref >39)
LDL Chol Calc (NIH): 109 mg/dL — ABNORMAL HIGH (ref 0–99)
Triglycerides: 195 mg/dL — ABNORMAL HIGH (ref 0–149)
VLDL Cholesterol Cal: 34 mg/dL (ref 5–40)

## 2022-03-12 LAB — HEMOGLOBIN A1C
Est. average glucose Bld gHb Est-mCnc: 126 mg/dL
Hgb A1c MFr Bld: 6 % — ABNORMAL HIGH (ref 4.8–5.6)

## 2022-03-13 DIAGNOSIS — Z419 Encounter for procedure for purposes other than remedying health state, unspecified: Secondary | ICD-10-CM | POA: Diagnosis not present

## 2022-03-14 ENCOUNTER — Ambulatory Visit: Payer: Medicaid Other | Admitting: Physical Therapy

## 2022-03-18 ENCOUNTER — Ambulatory Visit: Payer: Medicaid Other | Attending: Orthopedic Surgery | Admitting: Physical Therapy

## 2022-03-18 ENCOUNTER — Encounter: Payer: Self-pay | Admitting: Physical Therapy

## 2022-03-18 ENCOUNTER — Telehealth: Payer: Self-pay | Admitting: Physical Therapy

## 2022-03-18 NOTE — Telephone Encounter (Signed)
Attempted to contact patient regarding attendance via [place] interpreter. No answer and no option to leave a voicemail.

## 2022-03-22 DIAGNOSIS — M545 Low back pain, unspecified: Secondary | ICD-10-CM | POA: Insufficient documentation

## 2022-03-25 ENCOUNTER — Ambulatory Visit: Payer: Medicaid Other | Admitting: Physical Therapy

## 2022-04-02 ENCOUNTER — Encounter: Payer: Medicaid Other | Admitting: Physical Therapy

## 2022-04-12 DIAGNOSIS — Z419 Encounter for procedure for purposes other than remedying health state, unspecified: Secondary | ICD-10-CM | POA: Diagnosis not present

## 2022-04-19 ENCOUNTER — Other Ambulatory Visit: Payer: Self-pay | Admitting: Neurology

## 2022-04-29 NOTE — Progress Notes (Unsigned)
Assessment/Plan:   1.  Parkinsons Disease  -Continue carbidopa/levodopa 25/100 2/1/1 - move dosing to 8am/noon/4pm  -Continue with ropinirole, 1 mg 3 times per day.  Has markedly helped her rigidity.  She feels much better on the medication.  The only issue is that she is taking the last dose at bedtime (and starts the first dose around noon with her second dose of levodopa).  She feels that it works best this way.  Did not change it today, but may need to change that in the future.  -We discussed that it used to be thought that levodopa would increase risk of melanoma but now it is believed that Parkinsons itself likely increases risk of melanoma. she is to get regular skin checks.  She was given names and contact information to dermatologist.  -discussed dbs.  Questions asked and answered to best of my ability   2.  Depression  -Patient has self discontinued the escitalopram.  She stated that when it ran out, she just never got it refilled.  She feels that mood is good.  She is not ready to restart.    3.  Insomnia  -Continue trazodone, 50 mg nightly  4  LBP  -***She does have moderate to severe spinal stenosis at L4-L5 and is following with orthopedics, Dr. Shelle Iron, for this Subjective:   Marissa Garcia was seen today in follow up for Parkinsons disease.  My previous records were reviewed prior to todays visit as well as outside records available to me.  Medical translator with patient.  Last visit, we started trazodone for insomnia.  She reports that ***.  She was in the emergency room for a fall on October 27.  She was walking on a wet floor at work and slipped and fell backwards.  Her biggest complaint after that was back pain, so the emergency room did do MRI of her lumbar spine which did demonstrate chronic compression fracture at the T12 level and moderate to severe spinal stenosis at L4-L5, but that had not changed since previous examination in 2021.  She is apparently working with  orthopedics/spine for this.  She is seeing Dr. Shelle Iron.  I can see limited notes and those are reviewed.  Patients symptoms were felt due to lumbar strain and facet arthrosis.  Conservative treatment was recommended with anti-inflammatories.  Current prescribed movement disorder medications: carbidopa/levodopa 25/100, 2 tablets at 8 AM/1 tablet at noon/1 tablet at 4 PM (increased last visit) Ropinirole, 1 mg 3 times per day Trazodone, 50 mg nightly   PREVIOUS MEDICATIONS: pramipexole (EDS); propranolol (bradycardia and low blood pressure); rotigotine patch (insurance denied - samples helped); escitalopram (helped, but when she ran out she felt mood was good and did not want to restart)  ALLERGIES:   Allergies  Allergen Reactions   Propofol     CURRENT MEDICATIONS:  Outpatient Encounter Medications as of 04/30/2022  Medication Sig   BIPERIDEN HCL PO Take 2 mg by mouth 3 (three) times daily. (Patient not taking: Reported on 03/11/2022)   carbidopa-levodopa (SINEMET IR) 25-100 MG tablet TAKE 2 TABLETS BY MOUTH AT 8 AM, 1 TABLET BY MOUTH AT NOON AND 1 TABLET BY MOUTH AT 4 PM.   carbidopa-levodopa (SINEMET IR) 25-100 MG tablet 2 at 8am, 1 at noon, 1 at 4pm   carbidopa-levodopa (SINEMET IR) 25-100 MG tablet TAKE 2 TABLETS BY MOUTH AT 8 AM, 1 TABLET BY MOUTH AT NOON AND 1 TABLET BY MOUTH AT 4 PM.   methocarbamol (ROBAXIN) 500  MG tablet Take 500 mg by mouth 3 (three) times daily as needed.   rOPINIRole (REQUIP) 1 MG tablet Take 1 tablet (1 mg total) by mouth 3 (three) times daily.   traZODone (DESYREL) 50 MG tablet Take 1 tablet (50 mg total) by mouth at bedtime.   [DISCONTINUED] zolpidem (AMBIEN) 5 MG tablet Take 1 tablet (5 mg total) by mouth at bedtime as needed for sleep. (Patient not taking: No sig reported)   No facility-administered encounter medications on file as of 04/30/2022.    Objective:   PHYSICAL EXAMINATION:    VITALS:   There were no vitals filed for this  visit.      GEN:  The patient appears stated age and is in NAD. HEENT:  Normocephalic, atraumatic.  The mucous membranes are moist. The superficial temporal arteries are without ropiness or tenderness.   Neurological examination:  Orientation: The patient is alert and oriented x3. Cranial nerves: There is good facial symmetry with min facial hypomimia. The speech is fluent and clear (she does speak English most of the time). Soft palate rises symmetrically and there is no tongue deviation. Hearing is intact to conversational tone. Sensation: Sensation is intact to light touch throughout Motor: Strength is at least antigravity x4.  Movement examination: Tone: There is nl tone in the UE/LE Abnormal movements: there is some intermittent LUE rest tremor, intermittent Coordination:  There is no decremation today. Gait and Station: The patient has no difficulty arising out of a deep-seated chair without the use of the hands. The patient's stride length is good.    I have reviewed and interpreted the following labs independently    Chemistry      Component Value Date/Time   NA 139 03/09/2022 0815   NA 139 03/25/2019 1553   K 4.1 03/09/2022 0815   CL 105 03/09/2022 0815   CO2 23 02/22/2022 1431   BUN 17 03/09/2022 0815   BUN 8 03/25/2019 1553   CREATININE 0.50 03/09/2022 0815   CREATININE 0.64 08/24/2015 1134      Component Value Date/Time   CALCIUM 8.7 (L) 02/22/2022 1431   ALKPHOS 56 10/13/2018 2239   AST 32 10/13/2018 2239   ALT 23 10/13/2018 2239   BILITOT 0.6 10/13/2018 2239       Lab Results  Component Value Date   WBC 6.0 02/22/2022   HGB 12.2 03/09/2022   HCT 36.0 03/09/2022   MCV 92.6 02/22/2022   PLT 259 02/22/2022    Lab Results  Component Value Date   TSH 1.060 08/25/2019     Total time spent on today's visit was 23 minutes, including both face-to-face time and nonface-to-face time.  Time included that spent on review of records (prior notes  available to me/labs/imaging if pertinent), discussing treatment and goals, answering patient's questions and coordinating care.  Cc:  Claiborne Rigg, NP

## 2022-04-30 ENCOUNTER — Ambulatory Visit: Payer: Medicaid Other | Admitting: Neurology

## 2022-04-30 ENCOUNTER — Encounter: Payer: Self-pay | Admitting: Neurology

## 2022-04-30 VITALS — BP 124/78 | HR 72 | Ht <= 58 in | Wt 137.4 lb

## 2022-04-30 DIAGNOSIS — G20A1 Parkinson's disease without dyskinesia, without mention of fluctuations: Secondary | ICD-10-CM

## 2022-04-30 NOTE — Patient Instructions (Signed)
Local and Online Resources for Power over Parkinson's Group  December 2023    LOCAL [place] PARKINSON'S GROUPS   Power over Parkinson's Group:    Power Over Parkinson's Patient Education Group will be Wednesday, December 13th-*Hybrid meting*- in person at [place] [facility] location and via WEBEX, 2:00-3:00 pm.   Starting in November, Power over Parkinson's and Care Partner Groups will meet together, with plans for separate break out session for caregivers (*this will be evolving over the next few months) Upcoming Power over Parkinson's Meetings/Care Partner Support:  2nd Wednesdays of the month at 2 pm:   December 13th, January 10th  Contact Amy Marriott at [email or web address] if interested in participating in this group    LOCAL EVENTS AND NEW OFFERINGS  Parkinson's Holiday Party!  Wednesday, December 6th, 4:00-5:00 pm.  [employer].  RSVP to Karen Simmers at [phone] or [email or web address] New PWR! Moves Community Fitness Instructor-Led Classes offering at [employer]!  TUESDAYS and Wednesdays 1-2 pm.   Contact Christy Weaver at  [email or web address]  or [phone] (Tuesday classes are modified for chair and standing only) Dance for Parkinson 's classes will be on Tuesdays 9:30am-10:30am starting October 3-December 12 with a break the week of November 21st. Located in the [facility] [facility], in the first floor of the [facility] ([address] To register:  [email or web address] or [phone]  Drumming for Parkinson's will be held on 2nd and 4th Mondays at 11:00 am.   Located at the [employer] ([address] [place].)  Contact Jane Maydian at [email or web address] or [phone]  Through support from the Parkinson's Foundation, the Dance and Drumming for Parkinson's classes are free for both patients and caregivers.    [employer] Parkinson's Tai Chi Class, Mondays at  11 am.  Call [phone] for details   ONLINE EDUCATION AND SUPPORT  Parkinson Foundation:  www.parkinson.org  PD Health at Home continues:  Mindfulness Mondays, Wellness Wednesdays, Fitness Fridays   Upcoming Education:    Eating and Feeling Well through the Holidays. Wednesday, Dec. 6th,  1-2 pm  Hospital Safety.  Wednesday, Dec. 13th, 1-2 pm Register for expert briefings (webinars) at [email or web address].org/resources-support/online-[email or web address]/expert-[email or web address]-webinars  Please check out their website to sign up for emails and see their full online offerings      [employer] Foundation:  www.michaeljfox.org   Third Thursday Webinars:  On the third Thursday of every month at 12 p.m. ET, join our free live webinars to learn about various aspects of living with Parkinson's disease and our work to speed medical breakthroughs.  Upcoming Webinar:  Tools for Diagnosing and Visualizing Parkinson's Disease.  Thursday, December 21st at 12 noon. Check out additional information on their website to see their full online offerings    Davis Phinney Foundation:  www.davisphinneyfoundation.org  Upcoming Webinar:   Stay tuned  Webinar Series:  Living with Parkinson's Meetup.   Third Thursdays each month, 3 pm  Care Partner Monthly Meetup.  With Connie Carpenter Phinney.  First Tuesday of each month, 2 pm  Check out additional information to Live Well Today on their website    Parkinson and Movement Disorders (PMD) Alliance:  www.pmdalliance.org  [employer]:  Online Education Events  Sign up for emails, which are sent weekly to give you updates on programming and online offerings    Parkinson's Association of the [place]:  www.parkinsonassociation.org  Information on online support groups, education events, and online exercises including Yoga, Parkinson's exercises and more-LOTS of information on   links to PD resources and online events  Virtual Support Group through Parkinson's Association  of the [place]; next one is scheduled for Wednesday, December 6th  at 2 pm.  (These are typically scheduled for the 1st Wednesday of the month at 2 pm).  Visit website for details.   MOVEMENT AND EXERCISE OPPORTUNITIES  PWR! Moves Classes at [place].  Wednesdays 10 and 11 am.   Contact Amy Marriott, PT [email or web address] if interested.  NEW PWR! Moves Class offerings at [employer].  *TUESDAYS* and Wednesdays 1-2 pm.    Contact Vonna Kotyk at  [email or web address]    Parkinson's Wellness Recovery (PWR! Moves)  www.[email or web address]  Info on the PWR! Virtual Experience:  You will have access to our expertise?through self-assessment, guided plans that start with the PD-specific fundamentals, educational content, tips, Q&A with an expert, and a growing Art therapist of PD-specific pre-recorded and live exercise classes of varying types and intensity - both physical and cognitive! If that is not enough, we offer 1:1 wellness consultations (in-person or virtual) to personalize your PWR! Research scientist (medical).   [place] Fridays:   As part of the PD Health @ Home program, this free video series focuses each week on one aspect of fitness designed to support people living with Parkinson's.? These weekly videos highlight the [place] fitness guidelines for people with Parkinson's disease.  ModemGamers.[email or web address]   Dance for PD website is offering free, live-stream classes throughout the week, as well as links to [employer] of classes:  [email or web address]  Virtual dance and Pilates for Parkinson's classes: Click on the Community Tab> Parkinson's Movement Initiative Tab.  To register for classes and for more information, visit [email or web address] and click the "community" tab.   [employer] Parkinson's Cycling Classes   [employer]:  Thursdays @ Noon-Live classes at [employer] ([employer] [facility] [email or web address]?or [phone])  [facility] [facility]: Virtual Classes Mondays and Thursdays Jeanette Caprice classes Tuesday, Wednesday and Thursday (contact [place] at [email or web address] ?or [phone])  [place]  Varied levels of classes are offered Tuesdays and Thursdays at [employer].   Stretching with Verdis Frederickson weekly class is also offered for people with Parkinson's  To observe a class or for more information, call [phone] or email Hezzie Bump at [email or web address]   ADDITIONAL SUPPORT AND RESOURCES  [employer]:Online Caregiver Education Opportunities:  www.well-springsolutions.org/caregiver-education/caregiver-support-group.  You may also contact Vickki Muff at [email or web address] or [phone].     [employer] Navigator:  Just1Navigator program, a?free service to help individuals and families through the journey of determining care for older adults.  The "Navigator" is a Education officer, museum, Arnell Asal, who will speak with a prospective client and/or loved ones to provide an assessment of the situation and a set of recommendations for a personalized care plan -- all free of charge, and whether?[employer] offers the needed service or not. If the need is not a service we provide, we are well-connected with reputable programs in town that we can refer you to.  www.well-springsolutions.org or to speak with the Navigator, call [phone].

## 2022-05-08 ENCOUNTER — Ambulatory Visit: Payer: Medicaid Other

## 2022-05-09 ENCOUNTER — Ambulatory Visit
Admission: RE | Admit: 2022-05-09 | Discharge: 2022-05-09 | Disposition: A | Discharge status: Home / Self Care | Payer: Medicaid Other | Source: Ambulatory Visit | Attending: Nurse Practitioner | Admitting: Nurse Practitioner

## 2022-05-09 DIAGNOSIS — Z1231 Encounter for screening mammogram for malignant neoplasm of breast: Secondary | ICD-10-CM

## 2022-05-13 DIAGNOSIS — Z419 Encounter for procedure for purposes other than remedying health state, unspecified: Secondary | ICD-10-CM | POA: Diagnosis not present

## 2022-05-24 ENCOUNTER — Encounter: Payer: Medicaid Other | Admitting: Nurse Practitioner

## 2022-06-05 ENCOUNTER — Encounter: Payer: Self-pay | Admitting: Neurology

## 2022-06-13 DIAGNOSIS — Z419 Encounter for procedure for purposes other than remedying health state, unspecified: Secondary | ICD-10-CM | POA: Diagnosis not present

## 2022-06-19 ENCOUNTER — Encounter: Payer: Self-pay | Admitting: Nurse Practitioner

## 2022-06-19 ENCOUNTER — Ambulatory Visit: Payer: Medicaid Other | Attending: Nurse Practitioner | Admitting: Nurse Practitioner

## 2022-06-19 ENCOUNTER — Other Ambulatory Visit (HOSPITAL_COMMUNITY)
Admission: RE | Admit: 2022-06-19 | Discharge: 2022-06-19 | Disposition: A | Discharge status: Home / Self Care | Payer: Medicaid Other | Source: Ambulatory Visit | Attending: Nurse Practitioner | Admitting: Nurse Practitioner

## 2022-06-19 VITALS — BP 111/70 | HR 70 | Ht <= 58 in | Wt 142.0 lb

## 2022-06-19 DIAGNOSIS — D649 Anemia, unspecified: Secondary | ICD-10-CM | POA: Diagnosis not present

## 2022-06-19 DIAGNOSIS — H029 Unspecified disorder of eyelid: Secondary | ICD-10-CM

## 2022-06-19 DIAGNOSIS — N761 Subacute and chronic vaginitis: Secondary | ICD-10-CM | POA: Insufficient documentation

## 2022-06-19 DIAGNOSIS — Z0000 Encounter for general adult medical examination without abnormal findings: Secondary | ICD-10-CM

## 2022-06-19 DIAGNOSIS — Z1211 Encounter for screening for malignant neoplasm of colon: Secondary | ICD-10-CM

## 2022-06-19 DIAGNOSIS — R7303 Prediabetes: Secondary | ICD-10-CM

## 2022-06-19 NOTE — Progress Notes (Signed)
Assessment & Plan:  Marissa Garcia was seen today for annual exam.  Diagnoses and all orders for this visit:  Encounter for annual physical exam  Colon cancer screening -     Ambulatory referral to Gastroenterology  Prediabetes -     Hemoglobin A1c  Anemia, unspecified type -     CBC with Differential  Chronic vaginitis -     Cervicovaginal ancillary only  Lesion of right upper eyelid -     Ambulatory referral to Ophthalmology    Patient has been counseled on age-appropriate routine health concerns for screening and prevention. These are reviewed and up-to-date. Referrals have been placed accordingly. Immunizations are up-to-date or declined.    Subjective:   Chief Complaint  Patient presents with   Annual Exam   HPI Marissa Garcia 57 y.o. female presents to office today for annual physical.  She has a past medical history of Burst fracture of T12 vertebra ((10/13/2018), Constipation, Headache, Hypercholesteremia, and Parkinson's disease (Followed by Neurology)   She is currently under workman's comp for a fall she sustained while working. Currently with low back pain which is aggravated by prolonged sitting, standing and lying down. She has an upcoming appt with the "spine specialist" soon.    Patient has been counseled on age-appropriate routine health concerns for screening and prevention. These are reviewed and up-to-date. Referrals have been placed accordingly. Immunizations are up-to-date or declined.     MAMMOGRAM: UTD COLONOSCOPY: overdue; referred today   Vaginitis: Patient complains of an abnormal vaginal discharge for several days. Vaginal symptoms include odor.Vulvar symptoms include none.STI Risk: Very low risk of STD exposureDischarge described as: malodorous.Other associated symptoms: none. Contraception: abstinence      Review of Systems  Constitutional:  Negative for fever, malaise/fatigue and weight loss.  HENT: Negative.  Negative for nosebleeds.   Eyes:  Negative.  Negative for blurred vision, double vision and photophobia.  Respiratory: Negative.  Negative for cough and shortness of breath.   Cardiovascular: Negative.  Negative for chest pain, palpitations and leg swelling.  Gastrointestinal: Negative.  Negative for heartburn, nausea and vomiting.  Genitourinary:  Negative for dysuria, flank pain, frequency, hematuria and urgency.       Vaginal odor  Musculoskeletal:  Positive for back pain. Negative for myalgias.  Skin: Negative.   Neurological: Negative.  Negative for dizziness, focal weakness, seizures and headaches.  Endo/Heme/Allergies: Negative.   Psychiatric/Behavioral: Negative.  Negative for suicidal ideas.     Past Medical History:  Diagnosis Date   Burst fracture of T12 vertebra ([place]) 16/02/9603   Complication of anesthesia    Constipation    Headache    Hypercholesteremia    Parkinson's disease     Past Surgical History:  Procedure Laterality Date   CESAREAN SECTION      Family History  Problem Relation Age of Onset   Hypertension Father    Hyperlipidemia Father    Hyperlipidemia Mother    Parkinson's disease Neg Hx     Social History Reviewed with no changes to be made today.   Outpatient Medications Prior to Visit  Medication Sig Dispense Refill   carbidopa-levodopa (SINEMET IR) 25-100 MG tablet TAKE 2 TABLETS BY MOUTH AT 8 AM, 1 TABLET BY MOUTH AT NOON AND 1 TABLET BY MOUTH AT 4 PM. 360 tablet 0   MOBIC 15 MG tablet 15 mg.     rOPINIRole (REQUIP) 1 MG tablet Take 1 tablet (1 mg total) by mouth 3 (three) times daily. 270 tablet 1  traZODone (DESYREL) 50 MG tablet Take 1 tablet (50 mg total) by mouth at bedtime. 90 tablet 1   No facility-administered medications prior to visit.    Allergies  Allergen Reactions   Propofol        Objective:    BP 111/70   Pulse 70   Ht 4\' 10"  (1.473 m)   Wt 142 lb (64.4 kg)   LMP 03/19/2016   SpO2 98%   BMI 29.68 kg/m  Wt Readings from Last 3 Encounters:   06/19/22 142 lb (64.4 kg)  04/30/22 137 lb 6.4 oz (62.3 kg)  03/11/22 137 lb 3.2 oz (62.2 kg)    Physical Exam Constitutional:      Appearance: She is well-developed.  HENT:     Head: Normocephalic and atraumatic.     Right Ear: Hearing, tympanic membrane, ear canal and external ear normal.     Left Ear: Hearing, tympanic membrane, ear canal and external ear normal.     Nose: Nose normal.     Right Turbinates: Not enlarged.     Left Turbinates: Not enlarged.     Mouth/Throat:     Lips: Pink.     Mouth: Mucous membranes are moist.     Dentition: No dental tenderness, gingival swelling, dental abscesses or gum lesions.     Pharynx: No oropharyngeal exudate.  Eyes:     General: No scleral icterus.       Right eye: No discharge.     Extraocular Movements: Extraocular movements intact.     Conjunctiva/sclera: Conjunctivae normal.     Pupils: Pupils are equal, round, and reactive to light.      Comments: Papular lesion below upper eyelid  Neck:     Thyroid: No thyromegaly.     Trachea: No tracheal deviation.  Cardiovascular:     Rate and Rhythm: Normal rate and regular rhythm.     Heart sounds: Normal heart sounds. No murmur heard.    No friction rub.  Pulmonary:     Effort: Pulmonary effort is normal. No accessory muscle usage or respiratory distress.     Breath sounds: Normal breath sounds. No decreased breath sounds, wheezing, rhonchi or rales.  Abdominal:     General: Bowel sounds are normal. There is no distension.     Palpations: Abdomen is soft. There is no mass.     Tenderness: There is no abdominal tenderness. There is no right CVA tenderness, left CVA tenderness, guarding or rebound.     Hernia: No hernia is present.  Genitourinary:    Comments: SELF SWAB Musculoskeletal:        General: No tenderness or deformity. Normal range of motion.     Cervical back: Normal range of motion and neck supple.  Lymphadenopathy:     Cervical: No cervical adenopathy.  Skin:     General: Skin is warm and dry.     Findings: No erythema.  Neurological:     Mental Status: She is alert and oriented to person, place, and time.     Cranial Nerves: No cranial nerve deficit.     Motor: Motor function is intact.     Coordination: Coordination is intact. Coordination normal.     Gait: Gait is intact.     Deep Tendon Reflexes:     Reflex Scores:      Patellar reflexes are 1+ on the right side and 1+ on the left side. Psychiatric:        Attention and Perception: Attention normal.  Mood and Affect: Mood normal.        Speech: Speech normal.        Behavior: Behavior normal.        Thought Content: Thought content normal.        Judgment: Judgment normal.          Patient has been counseled extensively about nutrition and exercise as well as the importance of adherence with medications and regular follow-up. The patient was given clear instructions to go to ER or return to medical center if symptoms don't improve, worsen or new problems develop. The patient verbalized understanding.   Follow-up: Return if symptoms worsen or fail to improve.   Gildardo Pounds, FNP-BC [facility], [place]   06/19/2022, 6:16 PM

## 2022-06-20 LAB — CBC WITH DIFFERENTIAL/PLATELET
Basophils Absolute: 0 10*3/uL (ref 0.0–0.2)
Basos: 1 %
EOS (ABSOLUTE): 0.2 10*3/uL (ref 0.0–0.4)
Eos: 2 %
Hematocrit: 36.3 % (ref 34.0–46.6)
Hemoglobin: 12.7 g/dL (ref 11.1–15.9)
Immature Grans (Abs): 0 10*3/uL (ref 0.0–0.1)
Immature Granulocytes: 0 %
Lymphocytes Absolute: 2.7 10*3/uL (ref 0.7–3.1)
Lymphs: 42 %
MCH: 32.5 pg (ref 26.6–33.0)
MCHC: 35 g/dL (ref 31.5–35.7)
MCV: 93 fL (ref 79–97)
Monocytes Absolute: 0.4 10*3/uL (ref 0.1–0.9)
Monocytes: 6 %
Neutrophils Absolute: 3.2 10*3/uL (ref 1.4–7.0)
Neutrophils: 49 %
Platelets: 294 10*3/uL (ref 150–450)
RBC: 3.91 x10E6/uL (ref 3.77–5.28)
RDW: 13.6 % (ref 11.7–15.4)
WBC: 6.5 10*3/uL (ref 3.4–10.8)

## 2022-06-20 LAB — HEMOGLOBIN A1C
Est. average glucose Bld gHb Est-mCnc: 123 mg/dL
Hgb A1c MFr Bld: 5.9 % — ABNORMAL HIGH (ref 4.8–5.6)

## 2022-06-20 LAB — CERVICOVAGINAL ANCILLARY ONLY
Bacterial Vaginitis (gardnerella): NEGATIVE
Candida Glabrata: NEGATIVE
Candida Vaginitis: NEGATIVE
Chlamydia: NEGATIVE
Comment: NEGATIVE
Comment: NEGATIVE
Comment: NEGATIVE
Comment: NEGATIVE
Comment: NEGATIVE
Comment: NORMAL
Neisseria Gonorrhea: NEGATIVE
Trichomonas: NEGATIVE

## 2022-07-12 DIAGNOSIS — Z419 Encounter for procedure for purposes other than remedying health state, unspecified: Secondary | ICD-10-CM | POA: Diagnosis not present

## 2022-07-16 ENCOUNTER — Other Ambulatory Visit: Payer: Self-pay | Admitting: Neurology

## 2022-07-16 DIAGNOSIS — G20A1 Parkinson's disease without dyskinesia, without mention of fluctuations: Secondary | ICD-10-CM

## 2022-08-12 DIAGNOSIS — Z419 Encounter for procedure for purposes other than remedying health state, unspecified: Secondary | ICD-10-CM | POA: Diagnosis not present

## 2022-08-20 DIAGNOSIS — B081 Molluscum contagiosum: Secondary | ICD-10-CM | POA: Diagnosis not present

## 2022-09-11 DIAGNOSIS — Z419 Encounter for procedure for purposes other than remedying health state, unspecified: Secondary | ICD-10-CM | POA: Diagnosis not present

## 2022-10-12 DIAGNOSIS — Z419 Encounter for procedure for purposes other than remedying health state, unspecified: Secondary | ICD-10-CM | POA: Diagnosis not present

## 2022-10-23 ENCOUNTER — Ambulatory Visit: Payer: Medicaid Other | Admitting: Physician Assistant

## 2022-10-24 ENCOUNTER — Other Ambulatory Visit: Payer: Self-pay | Admitting: Neurology

## 2022-10-24 DIAGNOSIS — G20A1 Parkinson's disease without dyskinesia, without mention of fluctuations: Secondary | ICD-10-CM

## 2022-10-30 NOTE — Progress Notes (Unsigned)
Assessment/Plan:   1.  Parkinsons Disease  -Continue carbidopa/levodopa 25/100, 1 at 7 AM/11 AM/3 PM/7 PM  -Continue with ropinirole, 1 mg 3 times per day.    -Start entacapone, 200 mg, 1 with first 3 dosages of levodopa.  Discussed risk, benefits, side effects.  Will need to watch out for increasing dyskinesia.  -We discussed that it used to be thought that levodopa would increase risk of melanoma but now it is believed that Parkinsons itself likely increases risk of melanoma. she is to get regular skin checks.  She was given names and contact information to dermatologist.  2.  Insomnia  -Restart trazodone, 50 mg nightly.  Currently with day/night reversal.  3  LBP  -She does have moderate to severe spinal stenosis at L4-L5 and is following with orthopedics, Dr. Shelle Iron and Dr. Ethelene Hal, for this.  She has also seen [facility] orthopedics   Subjective:   Marissa Garcia was seen today in follow up for Parkinsons disease.  My previous records were reviewed prior to todays visit as well as outside records available to me.  Interpreter is present.  Patient is to see orthopedics regarding her chronic back pain.  She has been following with orthopedics locally and more recently at [facility].  From a Parkinson's standpoint, she has had no falls.  She is no longer doing carbidopa/levodopa, 2/1/1, and instead she is doing, 1 po qid.  She did this due to tremor in the late evening.  She does still feel medication wears off at 3 hours.  No lightheadedness or near syncope.  She is not exercising.  She is having trouble sleeping at night and then is sleepy during the day.  She stopped her trazodone a few months ago.  Current prescribed movement disorder medications: carbidopa/levodopa 25/100, 2 tablets at 8 AM/1 tablet at noon/1 tablet at 4 PM Ropinirole, 1 mg 3 times per day Trazodone, 50 mg nightly (she is not on that one any longer - states that she doesn't want to "depend" on medication")   PREVIOUS MEDICATIONS:  pramipexole (EDS); propranolol (bradycardia and low blood pressure); rotigotine patch (insurance denied - samples helped); escitalopram (helped, but when she ran out she felt mood was good and did not want to restart); trazodone  ALLERGIES:   Allergies  Allergen Reactions   Propofol     CURRENT MEDICATIONS:  Outpatient Encounter Medications as of 10/31/2022  Medication Sig   carbidopa-levodopa (SINEMET IR) 25-100 MG tablet TAKE 2 TABLETS BY MOUTH AT 8AM,  AND 1 TABLET AT NOON AND 1 TABLET AT 4PM   MOBIC 15 MG tablet 15 mg.   rOPINIRole (REQUIP) 1 MG tablet TAKE 1 TABLET BY MOUTH THREE TIMES DAILY   traZODone (DESYREL) 50 MG tablet Take 1 tablet (50 mg total) by mouth at bedtime.   [DISCONTINUED] zolpidem (AMBIEN) 5 MG tablet Take 1 tablet (5 mg total) by mouth at bedtime as needed for sleep. (Patient not taking: No sig reported)   No facility-administered encounter medications on file as of 10/31/2022.    Objective:   PHYSICAL EXAMINATION:    VITALS:   Vitals:   10/31/22 1531  BP: 116/80  Pulse: 71  SpO2: 98%  Weight: 144 lb (65.3 kg)  Height: 4\' 10"  (1.473 m)      GEN:  The patient appears stated age and is in NAD. HEENT:  Normocephalic, atraumatic.  The mucous membranes are moist. The superficial temporal arteries are without ropiness or tenderness.   Neurological examination:  Orientation:  The patient is alert and oriented x3. Cranial nerves: There is good facial symmetry with min facial hypomimia. The speech is fluent and clear (she does speak English most of the time). Soft palate rises symmetrically and there is no tongue deviation. Hearing is intact to conversational tone. Sensation: Sensation is intact to light touch throughout Motor: Strength is at least antigravity x4.  Movement examination: Tone: There is nl tone in the UE/LE Abnormal movements: there is rare left upper extremity rest tremor.  She does have head dyskinesia when distracted. Coordination:   There is no decremation today. Gait and Station: The patient has no difficulty arising out of a deep-seated chair without the use of the hands. The patient's stride length is good.    I have reviewed and interpreted the following labs independently    Chemistry      Component Value Date/Time   NA 139 03/09/2022 0815   NA 139 03/25/2019 1553   K 4.1 03/09/2022 0815   CL 105 03/09/2022 0815   CO2 23 02/22/2022 1431   BUN 17 03/09/2022 0815   BUN 8 03/25/2019 1553   CREATININE 0.50 03/09/2022 0815   CREATININE 0.64 08/24/2015 1134      Component Value Date/Time   CALCIUM 8.7 (L) 02/22/2022 1431   ALKPHOS 56 10/13/2018 2239   AST 32 10/13/2018 2239   ALT 23 10/13/2018 2239   BILITOT 0.6 10/13/2018 2239       Lab Results  Component Value Date   WBC 6.5 06/19/2022   HGB 12.7 06/19/2022   HCT 36.3 06/19/2022   MCV 93 06/19/2022   PLT 294 06/19/2022    Lab Results  Component Value Date   TSH 1.060 08/25/2019     Total time spent on today's visit was 30 minutes, including both face-to-face time and nonface-to-face time.  Time included that spent on review of records (prior notes available to me/labs/imaging if pertinent), discussing treatment and goals, answering patient's questions and coordinating care.  Cc:  Claiborne Rigg, NP

## 2022-10-31 ENCOUNTER — Ambulatory Visit: Payer: Medicaid Other | Admitting: Neurology

## 2022-10-31 VITALS — BP 116/80 | HR 71 | Ht <= 58 in | Wt 144.0 lb

## 2022-10-31 DIAGNOSIS — G20B2 Parkinson's disease with dyskinesia, with fluctuations: Secondary | ICD-10-CM

## 2022-10-31 DIAGNOSIS — G20A1 Parkinson's disease without dyskinesia, without mention of fluctuations: Secondary | ICD-10-CM

## 2022-10-31 MED ORDER — ENTACAPONE 200 MG PO TABS: 200.0000 mg | ORAL_TABLET | Freq: Three times a day (TID) | ORAL | 1 refills | Status: DC

## 2022-10-31 MED ORDER — CARBIDOPA-LEVODOPA 25-100 MG PO TABS: ORAL_TABLET | ORAL | 1 refills | Status: DC

## 2022-10-31 MED ORDER — TRAZODONE HCL 50 MG PO TABS: 50.0000 mg | ORAL_TABLET | Freq: Every day | ORAL | 1 refills | Status: DC

## 2022-10-31 MED ORDER — ROPINIROLE HCL 1 MG PO TABS: 1.0000 mg | ORAL_TABLET | Freq: Three times a day (TID) | ORAL | 1 refills | Status: DC

## 2022-10-31 NOTE — Patient Instructions (Signed)
Start entacapone, 200 mg, 1 with first 3 dosages of carbidopa/levodopa 25/100  ADD trazodone, 50 mg at bedtime  Local and Online Resources for Power over Parkinson's Group  June 2024   LOCAL [place] PARKINSON'S GROUPS   Power over Parkinson's Group:    Power Over Parkinson's Patient Education Group will be Wednesday, JULY 10th-*PLEASE NOTE:  We will not be having a June meeting.  Our next meeting will be in July.  We apologize for the inconvenience.   Power over [employer] and Care Partner Groups will meet together, with plans for separate break out session for caregivers, depending on topic/speaker Upcoming Power over Parkinson's Meetings/Care Partner Support:  2nd Wednesdays of the month at 2 pm:   No regular June meeting, July 10th  Contact Amy Marriott at [email or web address] or Lynwood Dawley at [email or web address] if interested in participating in this group    LOCAL EVENTS AND NEW OFFERINGS  PLEASE NOTE:  There will be no June Power over [employer] meeting [facility] June 12th) Picnic Party for [employer].  [facility] Movement Disorders Program Celebration.  June 19th 1-3 pm.  Contact Lynwood Dawley at [email or web address] Parkinson's Social Game Night.  First Thursday of each month, 2:00-4:00 pm.  *Next date is June 6th*.  Rossie Muskrat [employer], [employer].  Contact [email or web address] if interested. Parkinson's CarePartner Group for Men is in the works, if interested email [email or web address] ACT FITNESS Chair Yoga classes "Train and Gain", Fridays 10 am, [employer].  Contact Gina at [phone].  Health visitor Classes offering at [employer]!  TUESDAYS (Chair Yoga)  and Wednesdays (PWR! Moves)  1:00 pm.   Contact Synetta Shadow at  [email or web address]  or [phone]  Antoine Poche! Moves classes.  Thursdays at 11:45, [facility].   Free to participate through [employer].  Contact Lynwood Dawley at [email or web address] or [phone] to register Drumming for Parkinson's will be held on 2nd and 4th Mondays at 11:00 am.   Located at the [place] of the [employer] ([address] [place].)  Contact Albertina Parr at [email or web address] or [phone]  [facility] Parkinson's Tai Chi Class, Mondays at 11 am.  Call [phone] for details   [facility] EDUCATION AND SUPPORT  Parkinson Foundation:  www.parkinson.org  PD Health at Home continues:  Mindfulness Mondays, Wellness Wednesdays, Fitness Fridays  (PWR! Moves as part of Fitness Fridays March 22nd, 1-1:45 pm) Upcoming Education:   Current and Emerging Methods to Aid Parkinson's Diagnosis.  Wednesday, May 29th, 1-2 pm Recognize and Respond to Parkinson's Psychosis.  Wednesday, June 5th, 1-2 pm Parkinsonisms.  Wednesday, June 12th, 1-2 pm Expert Briefing:  Addressing the Challenge of Apathy in Parkinson's.  Wednesday, September 11th, 1-2 pm. Register for virtual education and Photographer (webinars) at [employer].gl Please check out their website to sign up for emails and see their full online offerings     [employer] Foundation:  www.[email or web address]   Third Thursday Webinars:  On the third Thursday of every month at 12 p.m. ET, join our free live webinars to learn about various aspects of living with Parkinson's disease and our work to speed medical breakthroughs.  Upcoming Webinar:  You Want to [employer] for Parkinson's Research: Now What?  Thursday, June 20th at 12 noon. Check out additional information on their website to see their full online offerings    [facility]:  [email or web address]  Upcoming Webinar:  Living Safely with Parkinson's Inside and Outside of your Home.  Thursday,  June 13th , 3 pm Series:  Living with Parkinson's Meetup.   Third  Thursdays each month, 3 pm  Care Partner Monthly Meetup.  With Jillene Bucks [place].  First Tuesday of each month, 2 pm  Check out additional information to Live Well Today on their website    Parkinson and Movement Disorders (PMD) Alliance:  www.pmdalliance.org  [employer] Online:  Online Education Events  Sign up for emails, which are sent weekly to give you updates on programming and online offerings    Parkinson's Association of the [place]:  www.parkinsonassociation.org  Information on online support groups, education events, and online exercises including Yoga, Parkinson's exercises and more-LOTS of information on links to PD resources and online events  Virtual Support Group through Parkinson's Association of the [employer] [employer]; next one is scheduled for Wednesday, June 5th   MOVEMENT AND EXERCISE OPPORTUNITIES  Parkinson's Exercise Class offerings at [employer]. *TUESDAYS* (Chair yoga) and Wednesdays (PWR! Moves)  1:00 pm.   *Please note that PWR! Moves [place] class has ended.  Please consider trying PWR! Moves on Wednesdays at 1 pm at [facility].  Contact Synetta Shadow at [email or web address]    Parkinson's Wellness Recovery (PWR! Moves)  www.pwr4life.org  Info on the PWR! Virtual Experience:  You will have access to our expertise?through self-assessment, guided plans that start with the PD-specific fundamentals, educational content, tips, Q&A with an expert, and a growing Engineering geologist of PD-specific pre-recorded and live exercise classes of varying types and intensity - both physical and cognitive! If that is not enough, we offer 1:1 wellness consultations (in-person or virtual) to personalize your PWR! Dance movement psychotherapist.   Parkinson [employer] Fridays:   As part of the PD Health @ Home program, this free video series focuses each week on one aspect of fitness designed to support people living with Parkinson's.? These weekly videos highlight the Parkinson Foundation  fitness guidelines for people with Parkinson's disease.  MenusLocal.com.br  Dance for PD website is offering free, live-stream classes throughout the week, as well as links to [employer] of classes:  https://danceforparkinsons.org/  Virtual dance and Pilates for Parkinson's classes: Click on the Community Tab> Parkinson's Movement Initiative Tab.  To register for classes and for more information, visit www.NoteBack.co.za and click the "community" tab.   [employer] Parkinson's Cycling Classes   [facility] [employer]:  Thursdays @ Noon-Live classes at [employer] ([employer] at [email or web address]?or [phone])  [facility] [employer]: Classes Tuesday, Wednesday and Thursday (contact [place] at [email or web address] ?or [phone])  [facility] [employer]  Varied levels of classes are offered Tuesdays and Thursdays at [employer].   Stretching with Byrd Hesselbach weekly class is also offered for people with Parkinson's  To observe a class or for more information, call [phone] or email Patricia Nettle at [email or web address]   ADDITIONAL SUPPORT AND RESOURCES  [employer]:  Online Caregiver Education Opportunities:  www.well-springsolutions.org/caregiver-education/caregiver-support-group.  You may also contact Loleta Chance at [facility] -spring.[email or web address] or [phone].     Well-Spring Navigator:  Just1Navigator program, a?free service to help individuals and families through the journey of determining care for older adults.  The "Navigator" is a Child psychotherapist, Sidney Ace, who will speak with a prospective client and/or loved ones to provide an assessment of the situation and a set of recommendations for a personalized care plan -- all free of charge, and whether?Well[employer]Spring [employer] offers the needed service or not. If the need is not a service we provide, we are well-connected with reputable  programs in town that we can refer you  to.  www.well-springsolutions.org or to speak with the Navigator, call [phone].

## 2022-11-01 ENCOUNTER — Emergency Department (HOSPITAL_BASED_OUTPATIENT_CLINIC_OR_DEPARTMENT_OTHER): Payer: Medicaid Other

## 2022-11-01 ENCOUNTER — Encounter (HOSPITAL_BASED_OUTPATIENT_CLINIC_OR_DEPARTMENT_OTHER): Payer: Self-pay | Admitting: Emergency Medicine

## 2022-11-01 ENCOUNTER — Other Ambulatory Visit: Payer: Self-pay

## 2022-11-01 ENCOUNTER — Emergency Department (HOSPITAL_BASED_OUTPATIENT_CLINIC_OR_DEPARTMENT_OTHER)
Admission: EM | Admit: 2022-11-01 | Discharge: 2022-11-02 | Disposition: A | Discharge status: Home / Self Care | Payer: Medicaid Other | Attending: Emergency Medicine | Admitting: Emergency Medicine

## 2022-11-01 DIAGNOSIS — I7389 Other specified peripheral vascular diseases: Secondary | ICD-10-CM | POA: Diagnosis not present

## 2022-11-01 DIAGNOSIS — M7989 Other specified soft tissue disorders: Secondary | ICD-10-CM | POA: Diagnosis not present

## 2022-11-01 DIAGNOSIS — M7981 Nontraumatic hematoma of soft tissue: Secondary | ICD-10-CM

## 2022-11-01 MED ORDER — OXYCODONE-ACETAMINOPHEN 5-325 MG PO TABS
1.0000 | ORAL_TABLET | Freq: Once | ORAL | Status: DC
  Filled 2022-11-01: qty 1

## 2022-11-01 NOTE — ED Triage Notes (Signed)
Pt presents to ED POV. Pt c/o swelling and bruising to L ring finger 1-2h ago. Pt denies injury/insult.

## 2022-11-01 NOTE — ED Provider Notes (Signed)
  [place] EMERGENCY DEPARTMENT AT [facility] Provider Note   CSN: [id number] Arrival date & time: 11/01/22  1959     History {Add pertinent medical, surgical, social history, OB history to HPI:1} Chief Complaint  Patient presents with   Finger Problem    Marissa Garcia is a 57 y.o. female.  HPI     Home Medications Prior to Admission medications   Medication Sig Start Date End Date Taking? Authorizing Provider  carbidopa-levodopa (SINEMET IR) 25-100 MG tablet 1 at 8am/noon/4pm/8pm 10/31/22   Tat, Octaviano Batty, DO  entacapone (COMTAN) 200 MG tablet Take 1 tablet (200 mg total) by mouth 3 (three) times daily. 1 with first 3 dosages of levodopa 10/31/22   Tat, Octaviano Batty, DO  MOBIC 15 MG tablet 15 mg. 03/22/22   [provider]  rOPINIRole (REQUIP) 1 MG tablet Take 1 tablet (1 mg total) by mouth 3 (three) times daily. 10/31/22   Tat, Octaviano Batty, DO  traZODone (DESYREL) 50 MG tablet Take 1 tablet (50 mg total) by mouth at bedtime. 10/31/22   Tat, Octaviano Batty, DO  zolpidem (AMBIEN) 5 MG tablet Take 1 tablet (5 mg total) by mouth at bedtime as needed for sleep. Patient not taking: No sig reported 03/19/16 02/22/19  Dessa Phi, MD      Allergies    Propofol    Review of Systems   Review of Systems  Physical Exam Updated Vital Signs BP (!) 163/72   Pulse 64   Temp 98.3 F (36.8 C) (Oral)   Resp 18   LMP 03/19/2016   SpO2 99%  Physical Exam  ED Results / Procedures / Treatments   Labs (all labs ordered are listed, but only abnormal results are displayed) Labs Reviewed  CBC WITH DIFFERENTIAL/PLATELET  BASIC METABOLIC PANEL    EKG None  Radiology DG Finger Ring Left  Result Date: 11/01/2022 CLINICAL DATA:  Swelling and bruising to ring finger EXAM: LEFT RING FINGER 2+V COMPARISON:  None Available. FINDINGS: Mild swelling about the fourth finger. No acute fracture or dislocation. IMPRESSION: Mild swelling about the fourth finger without acute fracture or  dislocation. Electronically Signed   By: Minerva Fester M.D.   On: 11/01/2022 20:44    Procedures Procedures  {Document cardiac monitor, telemetry assessment procedure when appropriate:1}  Medications Ordered in ED Medications  oxyCODONE-acetaminophen (PERCOCET/ROXICET) 5-325 MG per tablet 1 tablet (has no administration in time range)    ED Course/ Medical Decision Making/ A&P   {   Click here for ABCD2, HEART and other calculatorsREFRESH Note before signing :1}                          Medical Decision Making Amount and/or Complexity of Data Reviewed Labs: ordered. Radiology: ordered.  Risk Prescription drug management.   ***  {Document critical care time when appropriate:1} {Document review of labs and clinical decision tools ie heart score, Chads2Vasc2 etc:1}  {Document your independent review of radiology images, and any outside records:1} {Document your discussion with family members, caretakers, and with consultants:1} {Document social determinants of health affecting pt's care:1} {Document your decision making why or why not admission, treatments were needed:1} Final Clinical Impression(s) / ED Diagnoses Final diagnoses:  None    Rx / DC Orders ED Discharge Orders     None

## 2022-11-02 LAB — CBC WITH DIFFERENTIAL/PLATELET
Abs Immature Granulocytes: 0.01 10*3/uL (ref 0.00–0.07)
Basophils Absolute: 0 10*3/uL (ref 0.0–0.1)
Basophils Relative: 0 %
Eosinophils Absolute: 0.2 10*3/uL (ref 0.0–0.5)
Eosinophils Relative: 3 %
HCT: 36.5 % (ref 36.0–46.0)
Hemoglobin: 12.4 g/dL (ref 12.0–15.0)
Immature Granulocytes: 0 %
Lymphocytes Relative: 49 %
Lymphs Abs: 3.4 10*3/uL (ref 0.7–4.0)
MCH: 30.9 pg (ref 26.0–34.0)
MCHC: 34 g/dL (ref 30.0–36.0)
MCV: 91 fL (ref 80.0–100.0)
Monocytes Absolute: 0.5 10*3/uL (ref 0.1–1.0)
Monocytes Relative: 8 %
Neutro Abs: 2.8 10*3/uL (ref 1.7–7.7)
Neutrophils Relative %: 40 %
Platelets: 260 10*3/uL (ref 150–400)
RBC: 4.01 MIL/uL (ref 3.87–5.11)
RDW: 13.1 % (ref 11.5–15.5)
WBC: 7 10*3/uL (ref 4.0–10.5)
nRBC: 0 % (ref 0.0–0.2)

## 2022-11-02 LAB — BASIC METABOLIC PANEL
Anion gap: 7 (ref 5–15)
BUN: 18 mg/dL (ref 6–20)
CO2: 27 mmol/L (ref 22–32)
Calcium: 9.4 mg/dL (ref 8.9–10.3)
Chloride: 103 mmol/L (ref 98–111)
Creatinine, Ser: 0.77 mg/dL (ref 0.44–1.00)
GFR, Estimated: >60 mL/min (ref >60)
Glucose, Bld: 99 mg/dL (ref 70–99)
Potassium: 3.5 mmol/L (ref 3.5–5.1)
Sodium: 137 mmol/L (ref 135–145)

## 2022-11-02 NOTE — ED Notes (Signed)
Reviewed AVS/discharge instruction with patient. Time allotted for and all questions answered. Patient is agreeable for d/c and escorted to ed exit by staff.  

## 2022-11-02 NOTE — Discharge Instructions (Signed)
Creo que tienes lo que se llama sndrome de Research scientist (medical). Aqu hay alguna informacin al respecto. No es una afeccin que ponga en peligro la vida ni los dedos. Generalmente mejora en una semana con hielo, elevacin y uso limitado.  Qu es el sndrome de Achenbach? El sndrome de Achenbach es un hematoma doloroso, recurrente y paroxstico en los dedos o la palma, no asociado con afecciones subyacentes graves.  Sndrome de Achenbach de los dedos sndrome de Research scientist (medical) sndrome de Research scientist (medical) sndrome de achenbach Quin contrae el sndrome de Achenbach? El sndrome de Achenbach se ha informado predominantemente en mujeres de [place], con una mediana de edad de aparicin de 48 a 50 aos (rango 22 a 76).  Aunque se dice que es [address], un estudio de 802 miembros de la poblacin general de [place] 18 y 74 aos en tres regiones de [place] encontr una prevalencia del 12,4% en mujeres y del 1,2% en hombres. Posiblemente sea ms comn en personas con antecedentes de fenmeno de Raynaud y sabaones, sin embargo, no est asociado con el tabaquismo.  Qu causa el sndrome de Achenbach? El sndrome de Achenbach es un hematoma subcutneo probablemente debido a una pequea fragilidad de los capilares o de una pequea vena en la piel palmar. En el 30% de los casos se pueden recordar traumatismos menores anteriores, como barrer, lavarse la ropa a mano o Lobbyist.  No existe una causa grave asociada a la aparicin de hematomas con facilidad, como un trastorno de la coagulacin o una anomala vascular.  Cules son las caractersticas clnicas del sndrome de Achenbach?  El sndrome de Achenbach se caracteriza por un traumatismo menor, aunque a menudo no se recuerda.  Inicio agudo del dolor seguido, minutos u horas ms tarde, por una decoloracin, generalmente azul-prpura. La cara palmar de la mano dominante es el sitio ms comn. La afectacin ms comn de los dedos segundo y tercero es la ms  comn, aunque puede verse afectado cualquier dedo de la mano, el pie o la zona palmar. Es caracterstica la preservacin de la yema de los dedos y del lecho ungueal. La decoloracin se resuelve espontneamente en 3 a 6 das (rango de 2 a [address]), sin experimentar los cambios de color que generalmente se observan cuando se resuelve un hematoma. Otros -- hinchazn; picazn, ardor o entumecimiento; afectacin del dedo dorsal; movimiento limitado. Hallazgos normales: pulsos radial y cubital; perfusin cutnea; temperatura de la piel; Capilaroscopia del lecho ungueal. Sndrome de Achenbach de las palmas. sndrome de achenbach sndrome de achenbach sndrome de achenbach Cmo varan las caractersticas clnicas en los diferentes tipos de piel? El sndrome de Achenbach se ha informado en caucsicos y asiticos. La decoloracin puede ser menos evidente en pieles de color.  Cules son las complicaciones del sndrome de Achenbach? Ninguno.  Cul es el tratamiento para el sndrome de Achenbach? Medidas generales Minimizar los desencadenantes conocidos Analgesia si es necesario Evite la aspirina y otros medicamentos antiinflamatorios no esteroides. Elevacin y enfriamiento para la hinchazn. Seguridades No existe un tratamiento especfico.  Cul es el resultado del sndrome de Achenbach? El sndrome de Achenbach es una entidad benigna con resolucin espontnea de las lesiones sin secuelas. Los episodios pueden recurrir con traumatismos menores, con una frecuencia media de recurrencia de 1,2 por ao.

## 2022-11-02 NOTE — ED Provider Notes (Incomplete)
[facility] EMERGENCY DEPARTMENT AT [facility] Provider Note   CSN: [id number] Arrival date & time: 11/01/22  1959     History {Add pertinent medical, surgical, social history, OB history to HPI:1} Chief Complaint  Patient presents with  . Finger Problem    Marissa Garcia is a 57 y.o. female.  57 year old female presents ER today with atraumatic left ring swelling and bruising and pain.  Started yesterday.  She had an episode a few years ago some summer that went better in 2 or 3 days.  She does not remember jamming it or having any trauma to it.  No other complaints.  No new medications.  No significant exposure to cold.        Home Medications Prior to Admission medications   Medication Sig Start Date End Date Taking? Authorizing Provider  carbidopa-levodopa (SINEMET IR) 25-100 MG tablet 1 at 8am/noon/4pm/8pm 10/31/22   Tat, Octaviano Batty, DO  entacapone (COMTAN) 200 MG tablet Take 1 tablet (200 mg total) by mouth 3 (three) times daily. 1 with first 3 dosages of levodopa 10/31/22   Tat, Octaviano Batty, DO  MOBIC 15 MG tablet 15 mg. 03/22/22   [provider]  rOPINIRole (REQUIP) 1 MG tablet Take 1 tablet (1 mg total) by mouth 3 (three) times daily. 10/31/22   Tat, Octaviano Batty, DO  traZODone (DESYREL) 50 MG tablet Take 1 tablet (50 mg total) by mouth at bedtime. 10/31/22   Tat, Octaviano Batty, DO  zolpidem (AMBIEN) 5 MG tablet Take 1 tablet (5 mg total) by mouth at bedtime as needed for sleep. Patient not taking: No sig reported 03/19/16 02/22/19  Dessa Phi, MD      Allergies    Propofol    Review of Systems   Review of Systems  Physical Exam Updated Vital Signs BP (!) 163/72   Pulse 64   Temp 98.3 F (36.8 C) (Oral)   Resp 18   LMP 03/19/2016   SpO2 99%  Physical Exam Vitals and nursing note reviewed.  Constitutional:      Appearance: She is well-developed.  HENT:     Head: Normocephalic and atraumatic.  Cardiovascular:     Rate and Rhythm: Normal rate and  regular rhythm.  Pulmonary:     Effort: No respiratory distress.     Breath sounds: No stridor.  Abdominal:     General: There is no distension.  Musculoskeletal:     Cervical back: Normal range of motion.  Skin:    General: Skin is warm and dry.     Comments: Right ring finger with diffuse ecchymosis and mild swelling.  Good cap refill.  Hurts worse when she bends it.  Has not tried any for symptoms.  Neurological:     Mental Status: She is alert.     ED Results / Procedures / Treatments   Labs (all labs ordered are listed, but only abnormal results are displayed) Labs Reviewed  CBC WITH DIFFERENTIAL/PLATELET  BASIC METABOLIC PANEL    EKG None  Radiology DG Finger Ring Left  Result Date: 11/01/2022 CLINICAL DATA:  Swelling and bruising to ring finger EXAM: LEFT RING FINGER 2+V COMPARISON:  None Available. FINDINGS: Mild swelling about the fourth finger. No acute fracture or dislocation. IMPRESSION: Mild swelling about the fourth finger without acute fracture or dislocation. Electronically Signed   By: Minerva Fester M.D.   On: 11/01/2022 20:44    Procedures Procedures  {Document cardiac monitor, telemetry assessment procedure when appropriate:1}  Medications Ordered  in ED Medications  oxyCODONE-acetaminophen (PERCOCET/ROXICET) 5-325 MG per tablet 1 tablet (has no administration in time range)    ED Course/ Medical Decision Making/ A&P   {   Click here for ABCD2, HEART and other calculatorsREFRESH Note before signing :1}                          Medical Decision Making Amount and/or Complexity of Data Reviewed Labs: ordered. Radiology: ordered.  Risk Prescription drug management.   ***  {Document critical care time when appropriate:1} {Document review of labs and clinical decision tools ie heart score, Chads2Vasc2 etc:1}  {Document your independent review of radiology images, and any outside records:1} {Document your discussion with family members,  caretakers, and with consultants:1} {Document social determinants of health affecting pt's care:1} {Document your decision making why or why not admission, treatments were needed:1} Final Clinical Impression(s) / ED Diagnoses Final diagnoses:  None    Rx / DC Orders ED Discharge Orders     None

## 2022-11-05 ENCOUNTER — Encounter: Payer: Self-pay | Admitting: Physician Assistant

## 2022-11-05 ENCOUNTER — Ambulatory Visit: Payer: Medicaid Other | Admitting: Physician Assistant

## 2022-11-05 VITALS — BP 111/62 | HR 79 | Ht 60.0 in | Wt 147.0 lb

## 2022-11-05 DIAGNOSIS — I7300 Raynaud's syndrome without gangrene: Secondary | ICD-10-CM

## 2022-11-05 NOTE — Patient Instructions (Signed)
Raynaud's Phenomenon  Raynaud's phenomenon is a condition that affects the blood vessels (arteries) that carry blood to the fingers and toes. The arteries that supply blood to the ears, lips, nipples, or the tip of the nose might also be affected. Raynaud's phenomenon causes the arteries to become narrow temporarily (spasm). As a result, the flow of blood to the affected areas is temporarily decreased. This usually occurs in response to cold temperatures or stress. During an attack, the skin in the affected areas turns white, then blue, and finally red. A person may also feel tingling or numbness in those areas. Attacks usually last for only a brief period, and then the blood flow to the area returns to normal. In most cases, Raynaud's phenomenon does not cause serious health problems. What are the causes? In many cases, the cause of this condition is not known. The condition may occur on its own (primary Raynaud's phenomenon) or may be associated with other diseases or factors (secondary Raynaud's phenomenon). Possible causes may include: Diseases or medical conditions that damage the arteries. Injuries and repetitive actions that hurt the hands or feet. Being exposed to certain chemicals. Taking medicines that narrow the arteries. Other medical conditions, such as lupus, scleroderma, rheumatoid arthritis, thyroid problems, blood disorders, Sjogren syndrome, or atherosclerosis. What increases the risk? The following factors may make you more likely to develop this condition: Being 20-40 years old. Being female. Having a family history of Raynaud's phenomenon. Living in a cold climate. Smoking. What are the signs or symptoms? Symptoms of this condition usually occur when you are exposed to cold temperatures or when you have emotional stress. The symptoms may last for a few minutes or up to several hours. They usually affect your fingers but may also affect your toes, nipples, lips, ears, or the  tip of your nose. Symptoms may include: Changes in skin color. The skin in the affected areas will turn pale or white. The skin may then change from white to bluish to red as normal blood flow returns to the area. Numbness, tingling, or pain in the affected areas. In severe cases, symptoms may include: Skin sores. Tissues decaying and dying (gangrene). How is this diagnosed? This condition may be diagnosed based on: Your symptoms and medical history. A physical exam. During the exam, you may be asked to put your hands in cold water to check for a reaction to cold temperature. Tests, such as: Blood tests to check for other diseases or conditions. A test to check the movement of blood through your arteries and veins (vascular ultrasound). A test in which the skin at the base of your fingernail is examined under a microscope (nailfold capillaroscopy). How is this treated? During an episode, you can take actions to help symptoms go away faster. Options include moving your arms around in a windmill pattern, warming your fingers under warm water, or placing your fingers in a warm body fold, such as your armpit. Long-term treatment for this condition often involves making lifestyle changes and taking steps to control your exposure to cold temperature. For more severe cases, medicine (calcium channel blockers) may be used to improve blood circulation. Follow these instructions at home: Avoiding cold temperatures Take these steps to avoid exposure to cold: If possible, stay indoors during cold weather. When you go outside during cold weather, dress in layers and wear mittens, a hat, a scarf, and warm footwear. Wear mittens or gloves when handling ice or frozen food. Use holders for glasses or cans containing   cold drinks. Let warm water run for a while before taking a shower or bath. Warm up the car before driving in cold weather. Lifestyle If possible, avoid stressful and emotional situations. Try  to find ways to manage your stress, such as: Exercise. Yoga. Meditation. Biofeedback. Do not use any products that contain nicotine or tobacco. These products include cigarettes, chewing tobacco, and vaping devices, such as e-cigarettes. If you need help quitting, ask your health care provider. Avoid secondhand smoke. Limit your use of caffeine. Switch to decaffeinated coffee, tea, and soda. Avoid chocolate. Avoid vibrating tools and machinery. General instructions Protect your hands and feet from injuries, cuts, or bruises. Avoid wearing tight rings or wristbands. Wear loose fitting socks and comfortable, roomy shoes. Take over-the-counter and prescription medicines only as told by your health care provider. Where to find support Raynaud's Association: www.raynauds.org Where to find more information National Institute of Arthritis and Musculoskeletal and Skin Diseases: www.niams.nih.gov Contact a health care provider if: Your discomfort becomes worse despite lifestyle changes. You develop sores on your fingers or toes that do not heal. You have breaks in the skin on your fingers or toes. You have a fever. You have pain or swelling in your joints. You have a rash. Your symptoms occur on only one side of your body. Get help right away if: Your fingers or toes turn black. You have severe pain in the affected areas. These symptoms may represent a serious problem that is an emergency. Do not wait to see if the symptoms will go away. Get medical help right away. Call your local emergency services (911 in the [place] Do not drive yourself to the hospital. Summary Raynaud's phenomenon is a condition that affects the arteries that carry blood to the fingers, toes, ears, lips, nipples, or the tip of the nose. In many cases, the cause of this condition is not known. Symptoms of this condition include changes in skin color along with numbness and tingling in the affected area. Treatment for  this condition includes lifestyle changes and reducing exposure to cold temperatures. Medicines may be used for severe cases of the condition. Contact your health care provider if your condition worsens despite treatment. This information is not intended to replace advice given to you by your health care provider. Make sure you discuss any questions you have with your health care provider. Document Revised: 07/04/2020 Document Reviewed: 07/04/2020 Elsevier Patient Education  2024 Elsevier Inc.  
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
## 2022-11-18 ENCOUNTER — Ambulatory Visit: Payer: Self-pay

## 2022-11-18 ENCOUNTER — Telehealth: Payer: Medicaid Other | Admitting: Family Medicine

## 2022-11-18 DIAGNOSIS — F418 Other specified anxiety disorders: Secondary | ICD-10-CM

## 2022-11-18 DIAGNOSIS — F4321 Adjustment disorder with depressed mood: Secondary | ICD-10-CM

## 2022-11-18 MED ORDER — HYDROXYZINE HCL 10 MG PO TABS: 10.0000 mg | ORAL_TABLET | Freq: Three times a day (TID) | ORAL | 0 refills | Status: DC | PRN

## 2022-11-18 NOTE — Patient Instructions (Signed)
[name], thank you for joining Freddy Finner, NP for today's virtual visit.  While this provider is not your primary care provider (PCP), if your PCP is located in our provider database this encounter information will be shared with them immediately following your visit.   A [facility] MyChart account gives you access to today's visit and all your visits, tests, and labs performed at [facility] " click here if you don't have a [facility] MyChart account or go to [email or web address]  Consent: (Patient) Marissa Garcia provided verbal consent for this virtual visit at the beginning of the encounter.  Current Medications:  Current Outpatient Medications:    hydrOXYzine (ATARAX) 10 MG tablet, Take 1 tablet (10 mg total) by mouth 3 (three) times daily as needed for anxiety., Disp: 30 tablet, Rfl: 0   carbidopa-levodopa (SINEMET IR) 25-100 MG tablet, 1 at 8am/noon/4pm/8pm, Disp: 360 tablet, Rfl: 1   entacapone (COMTAN) 200 MG tablet, Take 1 tablet (200 mg total) by mouth 3 (three) times daily. 1 with first 3 dosages of levodopa (Patient not taking: Reported on 11/05/2022), Disp: 270 tablet, Rfl: 1   MOBIC 15 MG tablet, 15 mg. (Patient not taking: Reported on 11/05/2022), Disp: , Rfl:    rOPINIRole (REQUIP) 1 MG tablet, Take 1 tablet (1 mg total) by mouth 3 (three) times daily., Disp: 270 tablet, Rfl: 1   traZODone (DESYREL) 50 MG tablet, Take 1 tablet (50 mg total) by mouth at bedtime., Disp: 90 tablet, Rfl: 1   Medications ordered in this encounter:  Meds ordered this encounter  Medications   hydrOXYzine (ATARAX) 10 MG tablet    Sig: Take 1 tablet (10 mg total) by mouth 3 (three) times daily as needed for anxiety.    Dispense:  30 tablet    Refill:  0    Order Specific Question:   Supervising Provider    Answer:   Merrilee Jansky [id number]     *If you need refills on other medications prior to your next appointment, please contact your pharmacy*  Follow-Up: Call back  or seek an in-person evaluation if the symptoms worsen or if the condition fails to improve as anticipated.  [facility] [phone]  Other Instructions Cmo sobrellevar una prdida en los adultos Managing Loss, Adult Las personas vivencian las prdidas de [place] a lo largo de sus vidas. Acontecimientos como una [place], un cambio de [place] y la prdida de amigos puede provocar una sensacin de prdida. La prdida puede ser tan grave como por ejemplo un cambio importante en la salud, un divorcio, la muerte de una mascota o la muerte de un ser querido. Es muy probable que todos estos tipos de prdida causen una reaccin fsica y emocional conocida como duelo. El duelo es el resultado de un cambio grande o la ausencia de algo o alguien que era muy importante para usted. El duelo es una reaccin normal ante la prdida. Diferentes factores pueden afectar su experiencia de duelo, por ejemplo: La naturaleza de la prdida. La relacin que tena con la persona que perdi o con lo que perdi. Su comprensin del duelo y cmo sobrellevarlo. Su red de contencin. Tenga en cuenta que cuando el duelo se torna intenso, puede ocasionar problemas ms graves [place], depresin, ansiedad o pensamientos suicidas. Hable con el mdico si tiene algunos de [employer]. Cmo manejar los cambios en el estilo de vida Mantenga su rutina habitual tanto como sea posible. Si tiene dificultad para concentrarse o  hacer las [employer], est bien que se tome un tiempo fuera de [place] rutina. Pase tiempo con amigos y seres queridos. Siga una dieta saludable, duerma mucho y descanse cuando se sienta cansado. Cmo reconocer los cambios  La manera en que maneje el duelo afectar su capacidad de funcionar como lo hace normalmente. Cuando [employer], puede experimentar estos cambios: Health and safety inspector, choque, tristeza, ansiedad, enojo, negacin y [place]. Pensamientos sobre la  [place]. Llanto repentino. Una sensacin fsica de vaco en el estmago. Problemas para dormir y Arts administrator. Cansancio (fatiga). Prdida de inters en actividades normales. Soar o Engineer, mining a la persona que muri. Una necesidad de recordar a la persona que perdi o lo que perdi. Dificultad para pensar en otra cosa que no sea la prdida durante un tiempo. Alivio. Si ha estado esperando la prdida durante un [place], es probable que sienta alivio cuando suceda. Siga estas instrucciones en su casa: Actividad Exprese sus sentimientos de [employer], como por ejemplo: Hable con otras personas sobre su prdida. Puede ser beneficioso encontrar otras personas que hayan tenido [place] prdida similar, como un grupo de apoyo. Anote sus sentimientos en un diario. Realice actividad fsica para liberar el estrs y la energa emocional. Maricela Curet actividades creativas como pintura, escultura o tocar un instrumento o Optometrist. Practique la resiliencia. Es la capacidad de reponerse y adaptarse despus de superar un obstculo. La lectura de algunas fuentes que incentiven a la resiliencia puede ser de ayuda para aprender las maneras de Museum/gallery conservator.  Instrucciones generales Sea paciente con usted mismo y con los dems. Deje que el proceso de duelo transcurra y recuerde que [place]. Algunos tipos de duelo pueden hacer que sienta que no se termina nunca. Puede encontrar Craig Staggers de seguir adelante sin olvidar ni dejar de tener sentimientos de aprecio por aquello que perdi. Aceptar la prdida es un [place]. Adaptarse puede llevar meses o ms tiempo. Concurra a todas las visitas de seguimiento. Esto es importante. Dnde encontrar apoyo Para obtener apoyo para sobrellevar la prdida: Pida al mdico ayuda y recomendaciones, como asesoramiento o terapia para el duelo. Piense en la posibilidad de unirse a un grupo de apoyo para personas que estn sobrellevando una prdida. Dnde buscar ms  informacin Puede encontrar ms informacin sobre cmo sobrellevar una prdida en: Arboriculturist (Sociedad Estadounidense de Oncologa Clnica): www.cancer.net American Psychological Association (Asociacin Estadounidense de Psicologa): DiceTournament.ca Comunquese con un mdico si: El duelo es intenso y [place]. El dolor es constante y no mejora. Es duelo comienza a Network engineer su organismo, por ejemplo, se enferma. Se siente deprimido, ansioso o desesperanzado. Solicite ayuda de inmediato si: Tiene pensamientos acerca de Runner, broadcasting/film/video o Physicist, medical a Economist. Busque ayuda de inmediato si alguna vez siente que puede hacerse dao a usted mismo o a otros, o tiene pensamientos de Patent examiner a su vida. Dirjase al centro de urgencias ms cercano o: Llame al 911. Llame a National Suicide Prevention Lifeline (Lnea Telefnica Nacional para la Prevencin del Suicidio) al 229-816-6663 o al 988. Est disponible las 24 horas del da. Enve un mensaje de texto a la lnea para casos de crisis al [phone]. Resumen El duelo es el resultado de un cambio grande o la ausencia de algo o alguien que era muy importante para usted. El duelo es una reaccin normal ante la prdida. Lo profundo del duelo y el perodo de recuperacin dependen del tipo de prdida as como de la capacidad para adaptarse al cambio y Software engineer  los sentimientos. Procesar el duelo requiere ser paciente y estar dispuesto a aceptar sus sentimientos, y Careers adviser la prdida con personas que brindan contencin. Es Forensic scientist los recursos adecuados para usted y Medical illustrator que cada persona vive el duelo de [place]. No existe un mismo proceso de duelo que suceda de la misma [place] para todas [employer]. Tenga en cuenta que cuando el duelo se torna intenso, puede ocasionar problemas ms graves [place], depresin, ansiedad o pensamientos suicidas. Hable con el mdico si tiene algunos de [employer]. Esta informacin no tiene Theme park manager el consejo del mdico. Asegrese de hacerle al mdico cualquier pregunta que tenga. Document Revised: 01/11/2021 Document Reviewed: 01/11/2021 Elsevier Patient Education  2024 Elsevier Inc.    If you have been instructed to have an in-person evaluation today at a local Urgent Care facility, please use the link below. It will take you to a list of all of our available [facility], including address, phone number and hours of operation. Please do not delay care.  [facility]  If you or a family member do not have a primary care provider, use the link below to schedule a visit and establish care. When you choose a [facility] primary care physician or advanced practice provider, you gain a long-term partner in health. Find a Primary Care Provider  Learn more about [facility]'s in-office and virtual care options: [facility] - Get Care Now

## 2022-11-18 NOTE — Telephone Encounter (Signed)
Chief Complaint: Anxiety Symptoms: Anxiety and sadness  Frequency: Onset last December 22, 2022 after the death of her sister. Pertinent Negatives: Patient denies thoughts of self harm or harm to others.  Disposition: [] ED /[x] Urgent Care (no appt availability in office) / [] Appointment(In office/virtual)/ []  [facility]/ [] Home Care/ [] Refused Recommended Disposition /[] [facility]/ []  Follow-up with PCP Additional Notes: Patient states she is having anxiety after the death of her sister. Patient states she is sad and having difficult sleeping at night. She also reports that she believes she had a panic attack when she found out her sister had passed away. Patient stated the funeral is tomorrow and she believes she needs medication or something to keep her calm during the services. Advised patient that she would need to be evaluated. Patient was agreeable. No appointments available in office. Patient has been schedule for a virtual urgent care visit today.  summary: anxiety   Pt Brickman's Sister passed away, pt is experiencing anxiety, Services are tomorrow and wednesday per daughter     Reason for Disposition  MODERATE anxiety (e.g., persistent or frequent anxiety symptoms; interferes with sleep, school, or work)  Answer Assessment - Initial Assessment Questions 1. CONCERN: "Did anything happen that prompted you to call today?"      I had a death in the family. The  2. ANXIETY SYMPTOMS: "Can you describe how you (your loved one; patient) have been feeling?" (e.g., tense, restless, panicky, anxious, keyed up, overwhelmed, sense of impending doom).      I don't feel very strong mental, it was 3. ONSET: "How long have you been feeling this way?" (e.g., hours, days, weeks)    Last  December 22, 2022 11/13/22 4. SEVERITY: "How would you rate the level of anxiety?" (e.g., 0 - 10; or mild, moderate, severe).     5 out of 10 on the anxiety scale 5. FUNCTIONAL IMPAIRMENT: "How have these feelings  affected your ability to do daily activities?" "Have you had more difficulty than usual doing your normal daily activities?" (e.g., getting better, same, worse; self-care, school, work, interactions)     Yes, I don't want to go outside right now. 6. HISTORY: "Have you felt this way before?" "Have you ever been diagnosed with an anxiety problem in the past?" (e.g., generalized anxiety disorder, panic attacks, PTSD). If Yes, ask: "How was this problem treated?" (e.g., medicines, counseling, etc.)     No, but this is the first time I have lost someone close to me.  7. RISK OF HARM - SUICIDAL IDEATION: "Do you ever have thoughts of hurting or killing yourself?" If Yes, ask:  "Do you have these feelings now?" "Do you have a plan on how you would do this?"     No, I am just sad. 8. TREATMENT:  "What has been done so far to treat this anxiety?" (e.g., medicines, relaxation strategies). "What has helped?"     Trying to breath through my sadness 9. TREATMENT - THERAPIST: "Do you have a counselor or therapist? Name?"     No 11. PATIENT SUPPORT: "Who is with you now?" "Who do you live with?" "Do you have family or friends who you can talk to?"        My daughter and other and family members 23. OTHER SYMPTOMS: "Do you have any other symptoms?" (e.g., feeling depressed, trouble concentrating, trouble sleeping, trouble breathing, palpitations or fast heartbeat, chest pain, sweating, nausea, or diarrhea)       I have had difficult sleeping and  trouble concentrating since she died.  Protocols used: Anxiety and Panic Attack-A-AH

## 2022-11-18 NOTE — Progress Notes (Signed)
Virtual Visit Consent   [name], you are scheduled for a virtual visit with a [facility] provider today. Just as with appointments in the office, your consent must be obtained to participate. Your consent will be active for this visit and any virtual visit you may have with one of our providers in the next 365 days. If you have a MyChart account, a copy of this consent can be sent to you electronically.  As this is a virtual visit, video technology does not allow for your provider to perform a traditional examination. This may limit your provider's ability to fully assess your condition. If your provider identifies any concerns that need to be evaluated in person or the need to arrange testing (such as labs, EKG, etc.), we will make arrangements to do so. Although advances in technology are sophisticated, we cannot ensure that it will always work on either your end or our end. If the connection with a video visit is poor, the visit may have to be switched to a telephone visit. With either a video or telephone visit, we are not always able to ensure that we have a secure connection.  By engaging in this virtual visit, you consent to the provision of healthcare and authorize for your insurance to be billed (if applicable) for the services provided during this visit. Depending on your insurance coverage, you may receive a charge related to this service.  I need to obtain your verbal consent now. Are you willing to proceed with your visit today? Marissa Garcia has provided verbal consent on 11/18/2022 for a virtual visit (video or telephone). Freddy Finner, NP  Date: 11/18/2022 2:38 PM  Virtual Visit via Video Note   I, Freddy Finner, connected with  Marissa Garcia  ([id number], 1965/09/22) on 11/18/22 at  2:30 PM EDT by a video-enabled telemedicine application and verified that I am speaking with the correct person using two identifiers.  Location: Patient: Virtual Visit Location Patient: Home Provider:  Virtual Visit Location Provider: Home Office   I discussed the limitations of evaluation and management by telemedicine and the availability of in person appointments. The patient expressed understanding and agreed to proceed.    History of Present Illness: Marissa Garcia is a 57 y.o. who identifies as a female who was assigned female at birth, and is being seen today for anxiety  Patient states she is having anxiety after the death of her sister. Patient states she is sad and having difficult sleeping at night. She also reports that she believes she had a panic attack when she found out her sister had passed away. Patient stated the funeral is tomorrow and she believes she needs medication or something to keep her calm during the services.  She feels sad and withdrawn. This is the first death of someone close to her. She denies SI/HI.   Has been breathing a lot to help. Has family support.Having trouble sleeping and concentrating since her sister's passing on 11/13/2022. Reports sister had cancer- but they didn't think she was going to pass this soon.    Problems:  Patient Active Problem List   Diagnosis Date Noted   Lumbar pain 03/22/2022   Impingement syndrome of right shoulder 08/05/2019   Chronic neck pain 08/05/2019   Sprain of right rotator cuff capsule 07/14/2019   Body mass index (BMI) 27.0-27.9, adult 07/14/2019   Nutritional counseling 07/13/2019   Parkinson's disease 04/22/2019   Lumbar radiculopathy 04/22/2019   Burst fracture of thoracic vertebra (HCC)  10/15/2018   Atrophic vulvovaginitis 01/25/2016   Perimenopausal 01/25/2016   DENTAL CARIES 07/05/2010   HEADACHE 03/21/2010   ANXIETY STATE, UNSPECIFIED 02/27/2010   ACID REFLUX DISEASE 02/27/2010   WRIST PAIN, RIGHT 02/27/2010   DIZZINESS 02/27/2010   CHEST PAIN 02/15/2010   HYPERCHOLESTEROLEMIA 05/13/2008    Allergies:  Allergies  Allergen Reactions   Propofol    Medications:  Current Outpatient Medications:     carbidopa-levodopa (SINEMET IR) 25-100 MG tablet, 1 at 8am/noon/4pm/8pm, Disp: 360 tablet, Rfl: 1   entacapone (COMTAN) 200 MG tablet, Take 1 tablet (200 mg total) by mouth 3 (three) times daily. 1 with first 3 dosages of levodopa (Patient not taking: Reported on 11/05/2022), Disp: 270 tablet, Rfl: 1   MOBIC 15 MG tablet, 15 mg. (Patient not taking: Reported on 11/05/2022), Disp: , Rfl:    rOPINIRole (REQUIP) 1 MG tablet, Take 1 tablet (1 mg total) by mouth 3 (three) times daily., Disp: 270 tablet, Rfl: 1   traZODone (DESYREL) 50 MG tablet, Take 1 tablet (50 mg total) by mouth at bedtime., Disp: 90 tablet, Rfl: 1  Observations/Objective: Patient is well-developed, well-nourished in no acute distress.  Resting comfortably  at home.  Head is normocephalic, atraumatic.  No labored breathing.  Speech is clear and coherent with logical content.  Patient is alert and oriented at baseline.  Withdrawn   Assessment and Plan:  1. Situational anxiety  - hydrOXYzine (ATARAX) 10 MG tablet; Take 1 tablet (10 mg total) by mouth 3 (three) times daily as needed for anxiety.  Dispense: 30 tablet; Refill: 0  2. Grief reaction  - hydrOXYzine (ATARAX) 10 MG tablet; Take 1 tablet (10 mg total) by mouth 3 (three) times daily as needed for anxiety.  Dispense: 30 tablet; Refill: 0   -advised on medication use and side effects- advised she could take half tablet to help if full makes her to sleepy- advised not to drive -encouraged her to reach out to PCP if needs are on going and not short term -Denies SI and HI -reports good family support -grief support info given  Reviewed side effects, risks and benefits of medication.    Patient acknowledged agreement and understanding of the plan.   Past Medical, Surgical, Social History, Allergies, and Medications have been Reviewed.   Follow Up Instructions: I discussed the assessment and treatment plan with the patient. The patient was provided an opportunity  to ask questions and all were answered. The patient agreed with the plan and demonstrated an understanding of the instructions.  A copy of instructions were sent to the patient via MyChart unless otherwise noted below.    The patient was advised to call back or seek an in-person evaluation if the symptoms worsen or if the condition fails to improve as anticipated.  Time:  I spent 13 minutes with the patient via telehealth technology discussing the above problems/concerns.    Freddy Finner, NP

## 2022-11-30 ENCOUNTER — Telehealth: Payer: Self-pay | Admitting: Pharmacy Technician

## 2022-11-30 ENCOUNTER — Other Ambulatory Visit (HOSPITAL_COMMUNITY): Payer: Self-pay

## 2022-12-05 NOTE — Telephone Encounter (Signed)
error 

## 2023-01-12 DIAGNOSIS — Z419 Encounter for procedure for purposes other than remedying health state, unspecified: Secondary | ICD-10-CM | POA: Diagnosis not present

## 2023-01-29 ENCOUNTER — Other Ambulatory Visit: Payer: Self-pay | Admitting: Neurology

## 2023-01-29 DIAGNOSIS — G20A1 Parkinson's disease without dyskinesia, without mention of fluctuations: Secondary | ICD-10-CM

## 2023-02-11 DIAGNOSIS — Z419 Encounter for procedure for purposes other than remedying health state, unspecified: Secondary | ICD-10-CM | POA: Diagnosis not present

## 2023-03-14 DIAGNOSIS — Z419 Encounter for procedure for purposes other than remedying health state, unspecified: Secondary | ICD-10-CM | POA: Diagnosis not present

## 2023-04-13 DIAGNOSIS — Z419 Encounter for procedure for purposes other than remedying health state, unspecified: Secondary | ICD-10-CM | POA: Diagnosis not present

## 2023-04-15 ENCOUNTER — Telehealth: Payer: Self-pay | Admitting: Neurology

## 2023-04-15 NOTE — Telephone Encounter (Signed)
Called patients daughter to ask about the Entacapone . She is going to check and call me back

## 2023-04-15 NOTE — Telephone Encounter (Signed)
Pt's daughter called in stating the pt's carbidopa- levodopa seems to wear off an hour or 2 early. She is also getting confused and accidentally left the stove on the other day.

## 2023-04-16 NOTE — Telephone Encounter (Signed)
Patients daughter said patient never picked up Entacapone because when doing the PA patient had no current insurance

## 2023-04-18 NOTE — Telephone Encounter (Signed)
Spoke to patients daughter and was able to get more information on the patient. Patients daughter said that patient isn't having hallucinations iit is more of a confused state not knowing were she is not knowing what she is doing or what is going on. Patients daughter said it has gradually gotten worse over the last 3-4 months. Patient also wanted to talk about a different medication regimen. The patient saw a lot of the new meds coming out at the symposium and wants to see if there is something else that can help her. She is scheduled to be seen on 05/29/23

## 2023-04-21 ENCOUNTER — Other Ambulatory Visit: Payer: Self-pay | Admitting: Neurology

## 2023-04-21 DIAGNOSIS — G20A1 Parkinson's disease without dyskinesia, without mention of fluctuations: Secondary | ICD-10-CM

## 2023-04-21 NOTE — Telephone Encounter (Signed)
Called daughter and asked her to come to next appointment so that would help the conversation with Dr. Arbutus Leas of her moms symptoms

## 2023-04-28 ENCOUNTER — Other Ambulatory Visit: Payer: Self-pay | Admitting: Neurology

## 2023-04-28 NOTE — Progress Notes (Unsigned)
Assessment/Plan:   1.  Parkinsons Disease  -Increase carbidopa/levodopa 25/100, 2 at 8 AM/2 tablets at 11-12 AM/1 tablet at 3-4 PM/7 PM.  Did tell her that this could increase dyskinesia (although certainly is "off" today b/c just took medication and they haven't kicked in yet)  -Wean off ropinirole due to possible confusion  -We discussed that it used to be thought that levodopa would increase risk of melanoma but now it is believed that Parkinsons itself likely increases risk of melanoma. she is to get regular skin checks.  She was given names and contact information to dermatologist.  -asks about dbs.  Discussed this.  Would be complicated given neuropsych testing (no ability to do in spanish in [place] area) and they are c/o memory trouble.  2.  Insomnia  -continue trazodone, 50 mg nightly.  Currently with day/night reversal.  3  LBP  -She does have moderate to severe spinal stenosis at L4-L5 and is following with orthopedics, Dr. Shelle Iron and Dr. Ethelene Hal, for this.  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  My previous records were reviewed prior to todays visit as well as outside records available to me.  Interpreter is present.  Patient with her daughter who supplements hx.  Daughter not previously present.  Daughter called [place] about confusion.  We asked if she was still on the entacapone, which was started last visit, but she had never picked it up because her insurance was apparently not active at the time we prescribed it.  The patient is not having hallucinations, but she seems more confused over the last 4 months.  Pt states that when "I don't have medication in my body I feel tired, rigid and shaky."  Her daughter notes that she has to hyperexplain things to get her to understand things and we "have to explain things differently."  No hallucinations.  She  feels foggy but mostly BEFORE she takes her medication.  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  Current prescribed movement disorder medications: carbidopa/levodopa 25/100, 1 at 7 AM/11 AM/3 PM/7 PM  Ropinirole, 1 mg 3 times per day Trazodone, 50 mg nightly (restarted last visit)   PREVIOUS MEDICATIONS: pramipexole (EDS); propranolol (bradycardia and low blood pressure); rotigotine patch (insurance denied - samples helped); escitalopram (helped, but when she ran out she felt mood was good and did not want to restart); trazodone; entacapone (prescribed but she never took it)  ALLERGIES:   Allergies  Allergen Reactions   Propofol     CURRENT MEDICATIONS:  Outpatient Encounter Medications as of 05/01/2023  Medication Sig   traZODone (DESYREL) 50 MG tablet Take 1 tablet (50 mg total) by mouth at bedtime.   [DISCONTINUED] carbidopa-levodopa (SINEMET IR) 25-100 MG tablet TAKE 1 TABLET BY MOUTH AT 7AM, 11 AM, 3PM AND 7PM   [DISCONTINUED] hydrOXYzine (ATARAX) 10 MG tablet Take 1 tablet (10 mg total) by mouth 3 (three) times daily as needed for anxiety.   [DISCONTINUED] rOPINIRole (REQUIP) 1 MG tablet TAKE 1 TABLET BY MOUTH THREE TIMES DAILY   carbidopa-levodopa (SINEMET IR) 25-100 MG tablet 2 at 8 AM/2 tablets at 11-12 AM/1 tablet at 3-4 PM/7 PM   MOBIC 15 MG tablet 15 mg. (Patient not taking: Reported on 05/01/2023)   [DISCONTINUED] entacapone (COMTAN) 200 MG tablet Take 1 tablet (200 mg total) by mouth 3 (three) times daily. 1 with  first 3 dosages of levodopa (Patient not taking: Reported on 05/01/2023)   [DISCONTINUED] rOPINIRole (REQUIP) 1 MG tablet Take 1 tablet (1 mg total) by mouth 3 (three) times daily.   [DISCONTINUED] zolpidem (AMBIEN) 5 MG tablet Take 1 tablet (5 mg total) by mouth at bedtime as needed for sleep. (Patient not taking: No sig reported)   No facility-administered encounter medications on file as of 05/01/2023.    Objective:   PHYSICAL EXAMINATION:    VITALS:    Vitals:   05/01/23 0806  BP: 118/82  Pulse: 68  SpO2: 98%  Weight: 139 lb 9.6 oz (63.3 kg)  Height: 5' (1.524 m)   GEN:  The patient appears stated age and is in NAD. HEENT:  Normocephalic, atraumatic.  The mucous membranes are moist. The superficial temporal arteries are without ropiness or tenderness.  Neurological examination:  Orientation: The patient is alert and oriented x3. Cranial nerves: There is good facial symmetry with min facial hypomimia. The speech is fluent and clear (she does speak English most of the time). Soft palate rises symmetrically and there is no tongue deviation. Hearing is intact to conversational tone. Sensation: Sensation is intact to light touch throughout Motor: Strength is at least antigravity x4.  Movement examination: Tone: There is nl tone in the UE/LE Abnormal movements: there is mild bilateral UE and LE rest tremor, L>R Coordination:  There is mod decremation on the L (pt states that med has not kicked in yet) Gait and Station: The patient has no difficulty arising out of a deep-seated chair without the use of the hands. The patient's stride length is good with marked decreased arm swing bilaterally.    I have reviewed and interpreted the following labs independently    Chemistry      Component Value Date/Time   NA 137 11/02/2022 0014   NA 139 03/25/2019 1553   K 3.5 11/02/2022 0014   CL 103 11/02/2022 0014   CO2 27 11/02/2022 0014   BUN 18 11/02/2022 0014   BUN 8 03/25/2019 1553   CREATININE 0.77 11/02/2022 0014   CREATININE 0.64 08/24/2015 1134      Component Value Date/Time   CALCIUM 9.4 11/02/2022 0014   ALKPHOS 56 10/13/2018 2239   AST 32 10/13/2018 2239   ALT 23 10/13/2018 2239   BILITOT 0.6 10/13/2018 2239       Lab Results  Component Value Date   WBC 7.0 11/02/2022   HGB 12.4 11/02/2022   HCT 36.5 11/02/2022   MCV 91.0 11/02/2022   PLT 260 11/02/2022    Lab Results  Component Value Date   TSH 1.060 08/25/2019      Total time spent on today's visit was 40 minutes, including both face-to-face time and nonface-to-face time.  Time included that spent on review of records (prior notes available to me/labs/imaging if pertinent), discussing treatment and goals, answering patient's questions and coordinating care.  Cc:  Claiborne Rigg, NP

## 2023-05-01 ENCOUNTER — Ambulatory Visit: Payer: Medicaid Other | Admitting: Neurology

## 2023-05-01 ENCOUNTER — Encounter: Payer: Self-pay | Admitting: Neurology

## 2023-05-01 VITALS — BP 118/82 | HR 68 | Ht 60.0 in | Wt 139.6 lb

## 2023-05-01 DIAGNOSIS — R413 Other amnesia: Secondary | ICD-10-CM

## 2023-05-01 DIAGNOSIS — G20B2 Parkinson's disease with dyskinesia, with fluctuations: Secondary | ICD-10-CM | POA: Diagnosis not present

## 2023-05-01 DIAGNOSIS — R482 Apraxia: Secondary | ICD-10-CM | POA: Diagnosis not present

## 2023-05-01 DIAGNOSIS — G20A1 Parkinson's disease without dyskinesia, without mention of fluctuations: Secondary | ICD-10-CM | POA: Diagnosis not present

## 2023-05-01 MED ORDER — CARBIDOPA-LEVODOPA 25-100 MG PO TABS: ORAL_TABLET | ORAL | 1 refills | Status: DC

## 2023-05-01 NOTE — Patient Instructions (Addendum)
1.  Increase carbidopa/levodopa 25/100,2 at 8 AM/2 tablets at 11-12 AM/1 tablet at 3-4 PM/7 PM 2.  Decrease ropinirole to 1 mg, half tablet 3 times per day for 1 week then half tablet twice per day for 1 week and then half tablet in the morning for 1 week and then stop ropinirole.

## 2023-05-14 DIAGNOSIS — Z419 Encounter for procedure for purposes other than remedying health state, unspecified: Secondary | ICD-10-CM | POA: Diagnosis not present

## 2023-05-29 ENCOUNTER — Ambulatory Visit: Payer: Medicaid Other | Admitting: Neurology

## 2023-06-14 DIAGNOSIS — Z419 Encounter for procedure for purposes other than remedying health state, unspecified: Secondary | ICD-10-CM | POA: Diagnosis not present

## 2023-06-17 ENCOUNTER — Ambulatory Visit: Payer: Self-pay | Admitting: Nurse Practitioner

## 2023-06-17 ENCOUNTER — Ambulatory Visit: Payer: Medicaid Other | Attending: Internal Medicine | Admitting: Internal Medicine

## 2023-06-17 VITALS — BP 96/54 | HR 68 | Temp 98.2°F | Ht 60.0 in | Wt 142.0 lb

## 2023-06-17 DIAGNOSIS — T2040XA Corrosion of unspecified degree of head, face, and neck, unspecified site, initial encounter: Secondary | ICD-10-CM

## 2023-06-17 DIAGNOSIS — Z5982 Transportation insecurity: Secondary | ICD-10-CM

## 2023-06-17 MED ORDER — ALCLOMETASONE DIPROPIONATE 0.05 % EX CREA: TOPICAL_CREAM | CUTANEOUS | 0 refills | Status: DC

## 2023-06-17 NOTE — Telephone Encounter (Signed)
 Copied from CRM [id number]. Topic: Clinical - Red Word Triage >> Jun 17, 2023  1:05 PM Marissa Garcia wrote: Red Word that prompted transfer to Nurse Triage: pt had a chemical peel.  This one is much more harsh as the ones before.  Her face is like a chemical burn. She said it is itching, burning and inflamed. Daughter Marissa Garcia is interpreting for her mom.  Declined interpreter  Chief Complaint: facial burn Symptoms: burn from chemical peel Frequency: constant Pertinent Negatives: Patient denies fever, sob Disposition: [] ED /[] Urgent Care (no appt availability in office) / [x] Appointment(In office/virtual)/ []  [facility]/ [] Home Care/ [] Refused Recommended Disposition /[] [facility]/ []  Follow-up with PCP Additional Notes: States had chemical peel in Jan on face, now has developed patches of areas where skin appears burned and shiny.  Apt made for today.  Care advice given, instructed to go to er if becomes worse.  Reason for Disposition  [1] SEVERE pain AND [2] not improved 2 hours after pain medicine/ice packs  Answer Assessment - Initial Assessment Questions 1. MECHANISM: How did the injury happen?      Had a chemical peel states has chemical burn 2. ONSET: When did the injury happen? (Minutes or hours ago)     05/19/23 3. LOCATION: What part of the face is injured?     Face is red 4. APPEARANCE of INJURY: What does the face look like?     Skin is red, shiny in areas,  5. BLEEDING: Is it bleeding now? If Yes, ask: Is it difficult to stop?     no 6. PAIN: Is there pain? If Yes, ask: How bad is the pain?  (e.g., Scale 1-10; or mild, moderate, severe)     8/10 7. SIZE: For cuts, bruises, or swelling, ask: How large is it? (e.g., inches or centimeters)      Face swollen 8. TETANUS: For any breaks in the skin, ask: When was the last tetanus booster?     na 9. OTHER SYMPTOMS: Do you have any other symptoms? (e.g., neck pain, headache, loss of  consciousness)     denies 10. PREGNANCY: Is there any chance you are pregnant? When was your last menstrual period?       Denies.  Protocols used: Face Injury-A-AH

## 2023-06-17 NOTE — Patient Instructions (Addendum)
Use the steroid cream on your face twice a day for 3 days.  Stop if you develop any worsening in burning or inflammation.  You have been referred to a dermatologist.

## 2023-06-17 NOTE — Progress Notes (Signed)
 Patient ID: Marissa Garcia, female    DOB: February 12, 1966  MRN: [id number]  CC: Chemical Exposure (Facial chemical peel burn on 05/19/2023 - Itching, burning, sensitive, irritation/)   Subjective: Marissa Garcia is a 58 y.o. female who presents for UC visit.  Her daughter is with her Her concerns today include:  Pt with hx of parkinson's  Discussed the use of AI scribe software for clinical note transcription with the patient, who gave verbal consent to proceed.  History of Present Illness   The patient presents with a facial burn following a CO2 chemical peel performed at a dermatologist's office in [place] on January 6th. She has had this procedure two to three times previously in [place] without complications, but this time she noticed an immediate difference. She experienced immediate itching, burning, and inflammation, which has been constant since the procedure. The inflammation worsened after the application of over-the-counter tranexamic acid 5% cream. The patient also reports headaches and increased sensitivity when stretching her face, particularly around the eyes.      Patient Active Problem List   Diagnosis Date Noted   Lumbar pain 03/22/2022   Impingement syndrome of right shoulder 08/05/2019   Chronic neck pain 08/05/2019   Sprain of right rotator cuff capsule 07/14/2019   Body mass index (BMI) 27.0-27.9, adult 07/14/2019   Nutritional counseling 07/13/2019   Parkinson's disease (HCC) 04/22/2019   Lumbar radiculopathy 04/22/2019   Burst fracture of thoracic vertebra (HCC) 10/15/2018   Atrophic vulvovaginitis 01/25/2016   Perimenopausal 01/25/2016   Dental caries 07/05/2010   Headache 03/21/2010   Anxiety state 02/27/2010   ACID REFLUX DISEASE 02/27/2010   WRIST PAIN, RIGHT 02/27/2010   DIZZINESS 02/27/2010   CHEST PAIN 02/15/2010   HYPERCHOLESTEROLEMIA 05/13/2008     Current Outpatient Medications on File Prior to Visit  Medication Sig Dispense Refill    carbidopa -levodopa  (SINEMET  IR) 25-100 MG tablet 2 at 8 AM/2 tablets at 11-12 AM/1 tablet at 3-4 PM/7 PM 540 tablet 1   traZODone  (DESYREL ) 50 MG tablet Take 1 tablet (50 mg total) by mouth at bedtime. 90 tablet 1   MOBIC  15 MG tablet 15 mg. (Patient not taking: Reported on 11/05/2022)     [DISCONTINUED] zolpidem  (AMBIEN ) 5 MG tablet Take 1 tablet (5 mg total) by mouth at bedtime as needed for sleep. (Patient not taking: No sig reported) 15 tablet 1   No current facility-administered medications on file prior to visit.    Allergies  Allergen Reactions   Propofol     Social History   Socioeconomic History   Marital status: Legally Separated    Spouse name: Not on file   Number of children: Not on file   Years of education: Not on file   Highest education level: GED or equivalent  Occupational History   Not on file  Tobacco Use   Smoking status: Never   Smokeless tobacco: Never  Vaping Use   Vaping status: Never Used  Substance and Sexual Activity   Alcohol use: Not Currently    Comment: rare   Drug use: No   Sexual activity: Yes    Birth control/protection: None  Other Topics Concern   Not on file  Social History Narrative   Right handed    Social Drivers of Health   Financial Resource Strain: High Risk (06/17/2023)   Overall Financial Resource Strain (CARDIA)    Difficulty of Paying Living Expenses: Very hard  Food Insecurity: No Food Insecurity (06/17/2023)   Hunger Vital Sign  Worried About Programme Researcher, Broadcasting/film/video in the Last Year: Never true    Ran Out of Food in the Last Year: Never true  Transportation Needs: No Transportation Needs (06/17/2023)   PRAPARE - Administrator, Civil Service (Medical): No    Lack of Transportation (Non-Medical): No  Physical Activity: Insufficiently Active (06/17/2023)   Exercise Vital Sign    Days of Exercise per Week: 2 days    Minutes of Exercise per Session: 50 min  Stress: Stress Concern Present (06/17/2023)   [employer]ational [employer] - Occupational Stress Questionnaire    Feeling of Stress : Very much  Social Connections: Socially Isolated (06/17/2023)   Social Connection and Isolation Panel [NHANES]    Frequency of Communication with Friends and Family: Once a week    Frequency of Social Gatherings with Friends and Family: Once a week    Attends Religious Services: More than 4 times per year    Active Member of [employer] or Organizations: No    Attends Engineer, Structural: Not on file    Marital Status: Divorced  Intimate Partner Violence: Unknown (08/16/2021)   Received from [employer], [facility]   HITS    Physically Hurt: Not on file    Insult or Talk Down To: Not on file    Threaten Physical Harm: Not on file    Scream or Curse: Not on file    Family History  Problem Relation Age of Onset   Hypertension Father    Hyperlipidemia Father    Hyperlipidemia Mother    Parkinson's disease Neg Hx     Past Surgical History:  Procedure Laterality Date   CESAREAN SECTION      ROS: Review of Systems Negative except as stated above  PHYSICAL EXAM: BP (!) 96/54 (BP Location: Left Arm, Patient Position: Sitting, Cuff Size: Normal)   Pulse 68   Temp 98.2 F (36.8 C) (Oral)   Ht 5' (1.524 m)   Wt 142 lb (64.4 kg)   LMP 03/19/2016   SpO2 97%   BMI 27.73 kg/m   Physical Exam  General appearance - alert, well appearing, middle age Hispanic female and in no distress Mental status - normal mood, behavior, speech, dress, motor activity, and thought processes Neurological - pt with resting tremors of arms and body Skin - face - peeling skin with hypopigmented spots.  See pics below          Latest Ref Rng & Units 11/02/2022   12:14 AM 03/09/2022    8:15 AM 02/22/2022    2:31 PM  CMP  Glucose 70 - 99 mg/dL 99  99  871   BUN 6 - 20 mg/dL 18  17  12    Creatinine 0.44 - 1.00 mg/dL 9.22  9.49  9.37   Sodium 135 - 145 mmol/L 137  139  137   Potassium 3.5 - 5.1  mmol/L 3.5  4.1  3.4   Chloride 98 - 111 mmol/L 103  105  105   CO2 22 - 32 mmol/L 27   23   Calcium 8.9 - 10.3 mg/dL 9.4   8.7    Lipid Panel     Component Value Date/Time   CHOL 203 (H) 03/11/2022 1416   TRIG 195 (H) 03/11/2022 1416   HDL 60 03/11/2022 1416   CHOLHDL 3.4 03/11/2022 1416   CHOLHDL 3.9 11/01/2013 1704   VLDL 32 11/01/2013 1704   LDLCALC 109 (H) 03/11/2022 1416  CBC    Component Value Date/Time   WBC 7.0 11/02/2022 0014   RBC 4.01 11/02/2022 0014   HGB 12.4 11/02/2022 0014   HGB 12.7 06/19/2022 1101   HCT 36.5 11/02/2022 0014   HCT 36.3 06/19/2022 1101   PLT 260 11/02/2022 0014   PLT 294 06/19/2022 1101   MCV 91.0 11/02/2022 0014   MCV 93 06/19/2022 1101   MCH 30.9 11/02/2022 0014   MCHC 34.0 11/02/2022 0014   RDW 13.1 11/02/2022 0014   RDW 13.6 06/19/2022 1101   LYMPHSABS 3.4 11/02/2022 0014   LYMPHSABS 2.7 06/19/2022 1101   MONOABS 0.5 11/02/2022 0014   EOSABS 0.2 11/02/2022 0014   EOSABS 0.2 06/19/2022 1101   BASOSABS 0.0 11/02/2022 0014   BASOSABS 0.0 06/19/2022 1101    ASSESSMENT AND PLAN: 1. Chemical burn of face, initial encounter (Primary) Stop Tranexamic Acid cream.  Will give low potency steroid cream to use BID x 3 days.  Advised that prolong use of steroid on the face can cause thinning of the skin. Referred to derm. - Ambulatory referral to Dermatology - alclomethasone (ACLOVATE ) 0.05 % cream; Apply to affected area twice a day for 3 days.  Dispense: 15 g; Refill: 0     Patient was given the opportunity to ask questions.  Patient verbalized understanding of the plan and was able to repeat key elements of the plan.   This documentation was completed using Paediatric nurse.  Any transcriptional errors are unintentional.  Orders Placed This Encounter  Procedures   Ambulatory referral to Dermatology     Requested Prescriptions   Signed Prescriptions Disp Refills   alclomethasone (ACLOVATE ) 0.05 % cream 15 g  0    Sig: Apply to affected area twice a day for 3 days.    No follow-ups on file.  Barnie Louder, MD, FACP

## 2023-06-23 ENCOUNTER — Other Ambulatory Visit: Payer: Self-pay

## 2023-07-07 ENCOUNTER — Other Ambulatory Visit: Payer: Self-pay

## 2023-07-12 DIAGNOSIS — Z419 Encounter for procedure for purposes other than remedying health state, unspecified: Secondary | ICD-10-CM | POA: Diagnosis not present

## 2023-07-24 NOTE — Progress Notes (Signed)
 Assessment/Plan:   1.  Parkinsons Disease  -Take carbidopa/levodopa 25/100, 1 at 9:30 am when awakens before breakfast and 1 right after breakfast at 10:30 am/2 tablets at 12:30pm/1 tablet at 3-4 PM/1 at 6pm-7 PM.  May take extra prn, when going to dance, etc  -add carbidopa/levodopa 50/200 CR at bed  -We discussed that it used to be thought that levodopa would increase risk of melanoma but now it is believed that Parkinsons itself likely increases risk of melanoma. she is to get regular skin checks.  She was given names and contact information to dermatologist.  -asks about dbs.  Discussed this.  Would be complicated given neuropsych testing in spanish (only done at [facility] and [employer]).  She is still quite interested  -We discussed Vyalev, which is foscarbidopa/foslevodopa pump that is newly FDA approved.  We discussed that it is for motor fluctuations in adults with advanced Parkinson's disease.  This is not on Medicaid formulary.    We discussed risks and benefits of this drug.   2.  Insomnia  -continue trazodone, 50 mg nightly.  Currently with day/night reversal.  3  LBP  -She does have moderate to severe spinal stenosis at L4-L5 and is following with orthopedics, Dr. Shelle Iron and Dr. Ethelene Hal, for this.  She has also seen [facility] orthopedics  4.  eye opening apraxia  -discussed botox along with r/b/se.  She is very interested and we will try to get approval  5.  Shoulder pain  -sounds like R rotator cuff issue and told her to f/u pcp or ortho   Subjective:   Marissa Garcia was seen today in follow up for Parkinsons disease.  My previous records were reviewed prior to todays visit as well as outside records available to me.   Patient with her daughter who supplements hx.  Pts levodopa was increased since our last visit and took her off of ropinirole, primarily because of confusion.  They report today that confusion is better but she is shaking a lot and feels pretty rigid.  Separately, she was  in her primary care office on February 4 after she got a chemical peel in [place] and ended up with a facial burn.  Daughter notes eyes closing spontaneously.  Pt c/o significant off time and missing events b/c of being off.  Notes R shoulder pain.  Asks about dbs.    Current prescribed movement disorder medications: carbidopa/levodopa 25/100, 2 at 8 AM/2 tablets at 11-12 AM/1 tablet at 3-4 PM/7 PM (she is starting medication now at 9 am but is only taking 1 in the AM because of nausea) Trazodone, 50 mg nightly (restarted last visit)   PREVIOUS MEDICATIONS: pramipexole (EDS); propranolol (bradycardia and low blood pressure); rotigotine patch (insurance denied - samples helped); escitalopram (helped, but when she ran out she felt mood was good and did not want to restart); trazodone; entacapone (prescribed but she never took it); ropinirole (taken off because of confusion)  ALLERGIES:   Allergies  Allergen Reactions   Propofol     CURRENT MEDICATIONS:  Outpatient Encounter Medications as of 07/28/2023  Medication Sig   alclomethasone (ACLOVATE) 0.05 % cream Apply to affected area twice a day for 3 days.   carbidopa-levodopa (SINEMET IR) 25-100 MG tablet 2 at 8 AM/2 tablets at 11-12 AM/1 tablet at 3-4 PM/7 PM   MOBIC 15 MG tablet 15 mg.   traZODone (DESYREL) 50 MG tablet Take 1 tablet (50 mg total) by mouth at bedtime.   [DISCONTINUED] zolpidem (  AMBIEN) 5 MG tablet Take 1 tablet (5 mg total) by mouth at bedtime as needed for sleep. (Patient not taking: No sig reported)   No facility-administered encounter medications on file as of 07/28/2023.    Objective:   PHYSICAL EXAMINATION:    VITALS:   Vitals:   07/28/23 0814  BP: 124/76  Pulse: 72  SpO2: 99%  Weight: 142 lb (64.4 kg)  Height: 5\' 2"  (1.575 m)    GEN:  The patient appears stated age and is in NAD. HEENT:  Normocephalic, atraumatic.  The mucous membranes are moist. The superficial temporal arteries are without ropiness or  tenderness.  Neurological examination:  Orientation: The patient is alert and oriented x3. Cranial nerves: There is good facial symmetry with min facial hypomimia. The speech is fluent and clear (she does speak English most of the time). Soft palate rises symmetrically and there is no tongue deviation. Hearing is intact to conversational tone. Sensation: Sensation is intact to light touch throughout Motor: Strength is at least antigravity x4.  She was seen at 8:30 am and has not taken any Parkinsons Disease medication yet today.  Movement examination: Tone: There is mild to mod increased tone in the LUE/LLE Abnormal movements: there is mild bilateral UE and LUE/LLE rest tremor Coordination:  There is mod decremation on the L (pt has not taken any med yet today) Gait and Station: The patient has no difficulty arising out of a deep-seated chair without the use of the hands. The patient's stride length is good with marked decreased arm swing bilaterally.    I have reviewed and interpreted the following labs independently    Chemistry      Component Value Date/Time   NA 137 11/02/2022 0014   NA 139 03/25/2019 1553   K 3.5 11/02/2022 0014   CL 103 11/02/2022 0014   CO2 27 11/02/2022 0014   BUN 18 11/02/2022 0014   BUN 8 03/25/2019 1553   CREATININE 0.77 11/02/2022 0014   CREATININE 0.64 08/24/2015 1134      Component Value Date/Time   CALCIUM 9.4 11/02/2022 0014   ALKPHOS 56 10/13/2018 2239   AST 32 10/13/2018 2239   ALT 23 10/13/2018 2239   BILITOT 0.6 10/13/2018 2239       Lab Results  Component Value Date   WBC 7.0 11/02/2022   HGB 12.4 11/02/2022   HCT 36.5 11/02/2022   MCV 91.0 11/02/2022   PLT 260 11/02/2022    Lab Results  Component Value Date   TSH 1.060 08/25/2019     Total time spent on today's visit was 45 minutes, including both face-to-face time and nonface-to-face time.  Time included that spent on review of records (prior notes available to  me/labs/imaging if pertinent), discussing treatment and goals, answering patient's questions and coordinating care.  Cc:  Claiborne Rigg, NP

## 2023-07-28 ENCOUNTER — Ambulatory Visit: Payer: Medicaid Other | Admitting: Neurology

## 2023-07-28 ENCOUNTER — Other Ambulatory Visit (HOSPITAL_COMMUNITY): Payer: Self-pay

## 2023-07-28 ENCOUNTER — Other Ambulatory Visit: Payer: Self-pay

## 2023-07-28 ENCOUNTER — Encounter (HOSPITAL_COMMUNITY): Payer: Self-pay

## 2023-07-28 ENCOUNTER — Encounter: Payer: Self-pay | Admitting: Neurology

## 2023-07-28 ENCOUNTER — Telehealth: Payer: Self-pay | Admitting: Pharmacy Technician

## 2023-07-28 VITALS — BP 124/76 | HR 72 | Ht 62.0 in | Wt 142.0 lb

## 2023-07-28 DIAGNOSIS — G20A1 Parkinson's disease without dyskinesia, without mention of fluctuations: Secondary | ICD-10-CM

## 2023-07-28 DIAGNOSIS — M25511 Pain in right shoulder: Secondary | ICD-10-CM | POA: Diagnosis not present

## 2023-07-28 DIAGNOSIS — G20A2 Parkinson's disease without dyskinesia, with fluctuations: Secondary | ICD-10-CM

## 2023-07-28 DIAGNOSIS — G8929 Other chronic pain: Secondary | ICD-10-CM

## 2023-07-28 DIAGNOSIS — G245 Blepharospasm: Secondary | ICD-10-CM

## 2023-07-28 MED ORDER — CARBIDOPA-LEVODOPA 25-100 MG PO TABS: ORAL_TABLET | ORAL | 1 refills | Status: DC

## 2023-07-28 MED ORDER — XEOMIN 50 UNITS IM SOLR: INTRAMUSCULAR | 3 refills | Status: DC

## 2023-07-28 MED ORDER — MYOBLOC 5000 UNIT/ML IM SOLN
5000.0000 [IU] | INTRAMUSCULAR | 3 refills | Status: DC
  Filled 2023-07-28: qty 1, 90d supply, fill #0

## 2023-07-28 MED ORDER — CARBIDOPA-LEVODOPA ER 50-200 MG PO TBCR: 1.0000 | EXTENDED_RELEASE_TABLET | Freq: Every day | ORAL | 1 refills | Status: DC

## 2023-07-28 NOTE — Patient Instructions (Signed)
 Take carbidopa/levodopa 25/100, 1 at 9:30 am when awakens before breakfast and 1 right after breakfast at 10:30 am/2 tablets at 12:30pm/1 tablet at 3-4 PM/1 at 6pm-7 PM.  May take extra 1/2 to 1 tablet as needed, when going to dance or if having a rough day or a long day with friends ADD carbidopa/levodopa 50/200 ER at bed  We will try to get the cognitive testing in Spanish, which is required before we consider any surgeries

## 2023-07-28 NOTE — Telephone Encounter (Signed)
 Pharmacy Patient Advocate Encounter   Received notification from Physician's Office that prior authorization for XEOMIN 50 is required/requested.   Insurance verification completed.   The patient is insured through [facility] .   Per test claim: PA required; PA submitted to above mentioned insurance via CoverMyMeds Key/confirmation #/EOC [id number] Status is pending

## 2023-07-28 NOTE — Telephone Encounter (Signed)
 Pharmacy Patient Advocate Encounter-  PA was submitted to [facility] and has been approved through: 3.17.25 TO 3.17.26 Authorization# [id number]  Please send prescription to Specialty Pharmacy: [facility]: [phone]  Estimated Copay is: 4  Admin Code: [id number]   IS NOT require Prior Auth.

## 2023-07-29 ENCOUNTER — Other Ambulatory Visit (HOSPITAL_COMMUNITY): Payer: Self-pay

## 2023-07-29 ENCOUNTER — Other Ambulatory Visit: Payer: Self-pay

## 2023-08-11 ENCOUNTER — Other Ambulatory Visit (HOSPITAL_COMMUNITY): Payer: Self-pay

## 2023-08-23 DIAGNOSIS — Z419 Encounter for procedure for purposes other than remedying health state, unspecified: Secondary | ICD-10-CM | POA: Diagnosis not present

## 2023-09-03 ENCOUNTER — Other Ambulatory Visit: Payer: Self-pay | Admitting: Neurology

## 2023-09-03 DIAGNOSIS — G4700 Insomnia, unspecified: Secondary | ICD-10-CM

## 2023-09-03 DIAGNOSIS — G20A2 Parkinson's disease without dyskinesia, with fluctuations: Secondary | ICD-10-CM

## 2023-09-22 DIAGNOSIS — Z419 Encounter for procedure for purposes other than remedying health state, unspecified: Secondary | ICD-10-CM | POA: Diagnosis not present

## 2023-10-13 ENCOUNTER — Ambulatory Visit (HOSPITAL_COMMUNITY)
Admission: EM | Admit: 2023-10-13 | Discharge: 2023-10-13 | Disposition: A | Discharge status: Home / Self Care | Attending: Family Medicine | Admitting: Family Medicine

## 2023-10-13 ENCOUNTER — Ambulatory Visit (INDEPENDENT_AMBULATORY_CARE_PROVIDER_SITE_OTHER)

## 2023-10-13 ENCOUNTER — Other Ambulatory Visit: Payer: Self-pay

## 2023-10-13 ENCOUNTER — Encounter (HOSPITAL_COMMUNITY): Payer: Self-pay | Admitting: *Deleted

## 2023-10-13 DIAGNOSIS — G8929 Other chronic pain: Secondary | ICD-10-CM | POA: Diagnosis not present

## 2023-10-13 DIAGNOSIS — M25511 Pain in right shoulder: Secondary | ICD-10-CM

## 2023-10-13 DIAGNOSIS — M546 Pain in thoracic spine: Secondary | ICD-10-CM | POA: Diagnosis not present

## 2023-10-13 DIAGNOSIS — R079 Chest pain, unspecified: Secondary | ICD-10-CM | POA: Diagnosis not present

## 2023-10-13 MED ORDER — TIZANIDINE HCL 4 MG PO TABS: ORAL_TABLET | ORAL | 0 refills | Status: DC

## 2023-10-13 MED ORDER — MELOXICAM 15 MG PO TABS: 7.5000 mg | ORAL_TABLET | Freq: Every day | ORAL | 0 refills | Status: DC

## 2023-10-13 NOTE — ED Triage Notes (Signed)
 PT reports for one month she has had MID back pain abnd Rt shoulder pain. Pt denies any injury.

## 2023-10-14 ENCOUNTER — Ambulatory Visit (HOSPITAL_COMMUNITY): Payer: Self-pay

## 2023-10-15 NOTE — ED Provider Notes (Signed)
 [facility]   [id number] 10/13/23 Arrival Time: 1112  ASSESSMENT & PLAN:  1. Chronic right shoulder pain   2. Chronic bilateral thoracic back pain    I have personally viewed and independently interpreted the imaging studies ordered this visit. CXR: lungs appear normal; visualized thoracic spine appears normal R shoulder: no acute bony changes  Trial of: Meds ordered this encounter  Medications   tiZANidine (ZANAFLEX) 4 MG tablet    Sig: Take 1 tablet by mouth every 8 hours for musculoskeletal pain.    Dispense:  30 tablet    Refill:  0   meloxicam (MOBIC) 15 MG tablet    Sig: Take 0.5-1 tablets (7.5-15 mg total) by mouth daily.    Dispense:  30 tablet    Refill:  0    Recommend:  Follow-up Information     Schedule an appointment as soon as possible for a visit  with Collins Dean, NP.   Specialty: Nurse Practitioner Why: For follow up. Contact information: [address] [place] [place] [address] [phone]                 Reviewed expectations re: course of current medical issues. Questions answered. Outlined signs and symptoms indicating need for more acute intervention. Patient verbalized understanding. After Visit Summary given.  SUBJECTIVE: History from: patient. Marissa Garcia is a 58 y.o. female who reports mid back pain and R shoulder pain; x 1 month; denies trauma; works sporadically as Advertising copywriter. Denies extremity sensation changes or weakness. Describes both pains a sore and achy. Denies respiratory symptoms/SOB/CP. OTC without help.  Past Surgical History:  Procedure Laterality Date   CESAREAN SECTION        OBJECTIVE:  Vitals:   10/13/23 1201  BP: 102/64  Pulse: (!) 58  Resp: 18  Temp: 97.7 F (36.5 C)  SpO2: 96%    General appearance: alert; no distress HEENT: [place]; AT Neck: supple with FROM Resp: unlabored respirations; CTAB Extremities: RUE: warm with well perfused appearance; mild anterior pain with  shoulder manipulation; denies specific bony TTP; shoulder with FROM CV: brisk extremity capillary refill of RUE; 2+ radial pulse of RUE. Skin: warm and dry; no visible rashes Neurologic: gait normal; normal sensation and strength of RUE Psychological: alert and cooperative; normal mood and affect  Imaging: DG Chest 2 View Result Date: 10/13/2023 CLINICAL DATA:  Chest pain EXAM: CHEST - 2 VIEW COMPARISON:  None Available. FINDINGS: The heart size and mediastinal contours are within normal limits. Both lungs are clear. The visualized skeletal structures are unremarkable. IMPRESSION: No active cardiopulmonary disease. Electronically Signed   By: Fredrich Jefferson M.D.   On: 10/13/2023 15:47   DG Shoulder Right Result Date: 10/13/2023 CLINICAL DATA:  Right shoulder pain EXAM: RIGHT SHOULDER - 2+ VIEW COMPARISON:  None Available. FINDINGS: There is no evidence of fracture or dislocation. There is no evidence of arthropathy or other focal bone abnormality. Soft tissues are unremarkable. IMPRESSION: Negative. Electronically Signed   By: Fredrich Jefferson M.D.   On: 10/13/2023 15:46       Allergies  Allergen Reactions   Propofol     Past Medical History:  Diagnosis Date   Burst fracture of T12 vertebra (HCC) 10/13/2018   Complication of anesthesia    Constipation    Headache    Hypercholesteremia    Parkinson's disease (HCC)    Social History   Socioeconomic History   Marital status: Legally Separated    Spouse name: Not  on file   Number of children: Not on file   Years of education: Not on file   Highest education level: GED or equivalent  Occupational History   Not on file  Tobacco Use   Smoking status: Never   Smokeless tobacco: Never  Vaping Use   Vaping status: Never Used  Substance and Sexual Activity   Alcohol use: Not Currently    Comment: rare   Drug use: No   Sexual activity: Yes    Birth control/protection: None  Other Topics Concern   Not on file  Social History  Narrative   Right handed    Social Drivers of Health   Financial Resource Strain: High Risk (06/17/2023)   Overall Financial Resource Strain (CARDIA)    Difficulty of Paying Living Expenses: Very hard  Food Insecurity: No Food Insecurity (06/17/2023)   Hunger Vital Sign    Worried About Running Out of Food in the Last Year: Never true    Ran Out of Food in the Last Year: Never true  Transportation Needs: No Transportation Needs (06/17/2023)   PRAPARE - Administrator, Civil Service (Medical): No    Lack of Transportation (Non-Medical): No  Physical Activity: Insufficiently Active (06/17/2023)   Exercise Vital Sign    Days of Exercise per Week: 2 days    Minutes of Exercise per Session: 50 min  Stress: Stress Concern Present (06/17/2023)   [employer] of Occupational Health - Occupational Stress Questionnaire    Feeling of Stress : Very much  Social Connections: Socially Isolated (06/17/2023)   Social Connection and Isolation Panel [NHANES]    Frequency of Communication with Friends and Family: Once a week    Frequency of Social Gatherings with Friends and Family: Once a week    Attends Religious Services: More than 4 times per year    Active Member of [employer] or Organizations: No    Attends Engineer, structural: Not on file    Marital Status: Divorced   Family History  Problem Relation Age of Onset   Hypertension Father    Hyperlipidemia Father    Hyperlipidemia Mother    Parkinson's disease Neg Hx    Past Surgical History:  Procedure Laterality Date   CESAREAN SECTION         Afton Albright, MD 10/15/23 [phone]

## 2023-10-22 ENCOUNTER — Encounter: Payer: Self-pay | Admitting: Family Medicine

## 2023-10-22 ENCOUNTER — Ambulatory Visit: Attending: Family Medicine | Admitting: Family Medicine

## 2023-10-22 VITALS — BP 94/58 | HR 66 | Ht 62.0 in | Wt 141.2 lb

## 2023-10-22 DIAGNOSIS — G8929 Other chronic pain: Secondary | ICD-10-CM | POA: Diagnosis not present

## 2023-10-22 DIAGNOSIS — M546 Pain in thoracic spine: Secondary | ICD-10-CM | POA: Diagnosis not present

## 2023-10-22 DIAGNOSIS — M7541 Impingement syndrome of right shoulder: Secondary | ICD-10-CM

## 2023-10-22 MED ORDER — TIZANIDINE HCL 4 MG PO TABS: ORAL_TABLET | ORAL | 0 refills | Status: DC

## 2023-10-22 MED ORDER — MELOXICAM 7.5 MG PO TABS: 7.5000 mg | ORAL_TABLET | Freq: Every day | ORAL | 1 refills | Status: DC

## 2023-10-22 NOTE — Progress Notes (Signed)
 Subjective:  Patient ID: Marissa Garcia, female    DOB: 09-Jan-1966  Age: 58 y.o. MRN: [id number]  CC: Back Pain (Hand pain(r))     Discussed the use of AI scribe software for clinical note transcription with the patient, who gave verbal consent to proceed.  History of Present Illness Marissa Garcia is a 58 year old female patient of Zelda Fleming, NP with a history of Parkinson's disease who presents with back and right shoulder pain.  She experiences severe mid-back pain that persists at rest, increasing in severity over the past month. Tizanidine  and meloxicam , prescribed during a recent ER visit a week ago, provides minimal relief. No recent heavy lifting is noted. A fall in 2020 led to therapy for her back.  Right shoulder pain occurs, especially when lifting her arm, causing difficulty with exercises and lifting her hand. Numbness and tingling in the right arm occur, with a sensation of needing to 'shake it out'. Pulling sensations in her hand and occasional right-sided neck pain are present. Chest x-ray from 10/13/2023 was unremarkable and shoulder x-ray revealed no findings to explain her symptoms.  She is currently taking meloxicam  and tizanidine , both of which are still available.    Past Medical History:  Diagnosis Date   Burst fracture of T12 vertebra (HCC) 10/13/2018   Complication of anesthesia    Constipation    Headache    Hypercholesteremia    Parkinson's disease (HCC)     Past Surgical History:  Procedure Laterality Date   CESAREAN SECTION      Family History  Problem Relation Age of Onset   Hypertension Father    Hyperlipidemia Father    Hyperlipidemia Mother    Parkinson's disease Neg Hx     Social History   Socioeconomic History   Marital status: Legally Separated    Spouse name: Not on file   Number of children: Not on file   Years of education: Not on file   Highest education level: GED or equivalent  Occupational History   Not on file  Tobacco  Use   Smoking status: Never   Smokeless tobacco: Never  Vaping Use   Vaping status: Never Used  Substance and Sexual Activity   Alcohol use: Not Currently    Comment: rare   Drug use: No   Sexual activity: Yes    Birth control/protection: None  Other Topics Concern   Not on file  Social History Narrative   Right handed    Social Drivers of Health   Financial Resource Strain: High Risk (06/17/2023)   Overall Financial Resource Strain (CARDIA)    Difficulty of Paying Living Expenses: Very hard  Food Insecurity: No Food Insecurity (06/17/2023)   Hunger Vital Sign    Worried About Running Out of Food in the Last Year: Never true    Ran Out of Food in the Last Year: Never true  Transportation Needs: No Transportation Needs (06/17/2023)   PRAPARE - Administrator, Civil Service (Medical): No    Lack of Transportation (Non-Medical): No  Physical Activity: Insufficiently Active (06/17/2023)   Exercise Vital Sign    Days of Exercise per Week: 2 days    Minutes of Exercise per Session: 50 min  Stress: Stress Concern Present (06/17/2023)   [employer] of Occupational Health - Occupational Stress Questionnaire    Feeling of Stress : Very much  Social Connections: Socially Isolated (06/17/2023)   Social Connection and Isolation Panel [NHANES]    Frequency of  Communication with Friends and Family: Once a week    Frequency of Social Gatherings with Friends and Family: Once a week    Attends Religious Services: More than 4 times per year    Active Member of [employer] or Organizations: No    Attends Engineer, structural: Not on file    Marital Status: Divorced    Allergies  Allergen Reactions   Propofol     Outpatient Medications Prior to Visit  Medication Sig Dispense Refill   alclomethasone (ACLOVATE ) 0.05 % cream Apply to affected area twice a day for 3 days. 15 g 0   carbidopa -levodopa  (SINEMET  CR) 50-200 MG tablet Take 1 tablet by mouth at bedtime. 90 tablet 1    carbidopa -levodopa  (SINEMET  IR) 25-100 MG tablet 1 at 9:30 am when awakens, 1 after breakfast at 10:30 am/2 tablets at 12:30pm/1 tablet at 3-4 PM/1 at 6pm-7 PM; extra prm 630 tablet 1   incobotulinumtoxinA (XEOMIN ) 50 units SOLR injection Inject 50 units into the head and neck every 90 days per Dr. Winferd Hatter 1 each 3   traZODone  (DESYREL ) 50 MG tablet TAKE 1 TABLET BY MOUTH AT BEDTIME 90 tablet 0   meloxicam  (MOBIC ) 15 MG tablet Take 0.5-1 tablets (7.5-15 mg total) by mouth daily. 30 tablet 0   tiZANidine  (ZANAFLEX ) 4 MG tablet Take 1 tablet by mouth every 8 hours for musculoskeletal pain. 30 tablet 0   No facility-administered medications prior to visit.     ROS Review of Systems  Constitutional:  Negative for activity change and appetite change.  HENT:  Negative for sinus pressure and sore throat.   Respiratory:  Negative for chest tightness, shortness of breath and wheezing.   Cardiovascular:  Negative for chest pain and palpitations.  Gastrointestinal:  Negative for abdominal distention, abdominal pain and constipation.  Genitourinary: Negative.   Musculoskeletal:        See HPI  Psychiatric/Behavioral:  Negative for behavioral problems and dysphoric mood.     Objective:  BP (!) 94/58   Pulse 66   Ht 5' 2 (1.575 m)   Wt 141 lb 3.2 oz (64 kg)   LMP 03/19/2016   SpO2 97%   BMI 25.83 kg/m      10/22/2023    9:29 AM 10/13/2023   12:01 PM 07/28/2023    8:14 AM  BP/Weight  Systolic BP 94 102 124  Diastolic BP 58 64 76  Wt. (Lbs) 141.2  142  BMI 25.83 kg/m2  25.97 kg/m2      Physical Exam Constitutional:      Appearance: She is well-developed.  Cardiovascular:     Rate and Rhythm: Normal rate.     Heart sounds: Normal heart sounds. No murmur heard. Pulmonary:     Effort: Pulmonary effort is normal.     Breath sounds: Normal breath sounds. No wheezing or rales.  Chest:     Chest wall: No tenderness.  Abdominal:     General: Bowel sounds are normal. There is no  distension.     Palpations: Abdomen is soft. There is no mass.     Tenderness: There is no abdominal tenderness.  Musculoskeletal:        General: Normal range of motion.     Cervical back: Tenderness (on palpation of R trapezius muscle) present.     Right lower leg: No edema.     Left lower leg: No edema.     Comments: Abduction of right upper extremity restricted to 120 degrees with associated  tenderness on active range of motion Normal range of motion of left upper extremity Slightly decreased right hand grip  Neurological:     Mental Status: She is alert and oriented to person, place, and time.     Coordination: Coordination abnormal (tremors).  Psychiatric:        Mood and Affect: Mood normal.        Latest Ref Rng & Units 11/02/2022   12:14 AM 03/09/2022    8:15 AM 02/22/2022    2:31 PM  CMP  Glucose 70 - 99 mg/dL 99  99  409   BUN 6 - 20 mg/dL 18  17  12    Creatinine 0.44 - 1.00 mg/dL 8.11  9.14  7.82   Sodium 135 - 145 mmol/L 137  139  137   Potassium 3.5 - 5.1 mmol/L 3.5  4.1  3.4   Chloride 98 - 111 mmol/L 103  105  105   CO2 22 - 32 mmol/L 27   23   Calcium 8.9 - 10.3 mg/dL 9.4   8.7     Lipid Panel     Component Value Date/Time   CHOL 203 (H) 03/11/2022 1416   TRIG 195 (H) 03/11/2022 1416   HDL 60 03/11/2022 1416   CHOLHDL 3.4 03/11/2022 1416   CHOLHDL 3.9 11/01/2013 1704   VLDL 32 11/01/2013 1704   LDLCALC 109 (H) 03/11/2022 1416    CBC    Component Value Date/Time   WBC 7.0 11/02/2022 0014   RBC 4.01 11/02/2022 0014   HGB 12.4 11/02/2022 0014   HGB 12.7 06/19/2022 1101   HCT 36.5 11/02/2022 0014   HCT 36.3 06/19/2022 1101   PLT 260 11/02/2022 0014   PLT 294 06/19/2022 1101   MCV 91.0 11/02/2022 0014   MCV 93 06/19/2022 1101   MCH 30.9 11/02/2022 0014   MCHC 34.0 11/02/2022 0014   RDW 13.1 11/02/2022 0014   RDW 13.6 06/19/2022 1101   LYMPHSABS 3.4 11/02/2022 0014   LYMPHSABS 2.7 06/19/2022 1101   MONOABS 0.5 11/02/2022 0014   EOSABS 0.2  11/02/2022 0014   EOSABS 0.2 06/19/2022 1101   BASOSABS 0.0 11/02/2022 0014   BASOSABS 0.0 06/19/2022 1101    Lab Results  Component Value Date   HGBA1C 5.9 (H) 06/19/2022      1. Impingement syndrome of right shoulder (Primary) Uncontrolled - Ambulatory referral to Physical Therapy - meloxicam  (MOBIC ) 7.5 MG tablet; Take 1 tablet (7.5 mg total) by mouth daily.  Dispense: 30 tablet; Refill: 1 - tiZANidine  (ZANAFLEX ) 4 MG tablet; Take 1 tablet by mouth every 8 hours for musculoskeletal pain.  Dispense: 30 tablet; Refill: 0  2. Chronic midline thoracic back pain Uncontrolled Advised to apply heat or ice whichever is tolerated to painful areas. Counseled on evidence of improvement in pain control with regards to yoga, water aerobics, massage, home physical therapy, exercise as tolerated. - Ambulatory referral to Physical Therapy - meloxicam  (MOBIC ) 7.5 MG tablet; Take 1 tablet (7.5 mg total) by mouth daily.  Dispense: 30 tablet; Refill: 1 - tiZANidine  (ZANAFLEX ) 4 MG tablet; Take 1 tablet by mouth every 8 hours for musculoskeletal pain.  Dispense: 30 tablet; Refill: 0   Meds ordered this encounter  Medications   meloxicam  (MOBIC ) 7.5 MG tablet    Sig: Take 1 tablet (7.5 mg total) by mouth daily.    Dispense:  30 tablet    Refill:  1   tiZANidine  (ZANAFLEX ) 4 MG tablet    Sig: Take  1 tablet by mouth every 8 hours for musculoskeletal pain.    Dispense:  30 tablet    Refill:  0    Follow-up: Return for Medical conditions with PCP, keep previously scheduled appointment.       Joaquin Mulberry, MD, FAAFP. [facility] [place], [place] [phone]   10/22/2023, 10:52 AM

## 2023-10-22 NOTE — Patient Instructions (Signed)
 VISIT SUMMARY:  Today, we discussed your ongoing back and right shoulder pain. You mentioned that your mid-back pain has worsened over the past month and that your right shoulder pain is causing numbness and tingling in your arm. We reviewed your current medications and previous treatments.  YOUR PLAN:  -CHRONIC MID BACK PAIN: Chronic mid back pain means you have had persistent pain in the middle of your back for a long time. This pain has recently become more severe and affects your daily activities. We will refer you to physical therapy for exercises, heat application, and acupuncture. Continue using tizanidine  and meloxicam  as prescribed. Additionally, use a heating pad and consider massage for pain relief.  -RIGHT SHOULDER PAIN: Right shoulder pain with numbness and tingling in your arm suggests that a nerve might be involved. Your X-ray was normal, so it could be due to a muscle spasm or pinched nerve. We will refer you to physical therapy for shoulder exercises and treatment. Continue using tizanidine  and meloxicam  as prescribed.  -NECK PAIN: Right-sided neck pain might be related to your shoulder and back pain, possibly due to a muscle spasm or pinched nerve. We will refer you to physical therapy for neck exercises and treatment. Continue using tizanidine  and meloxicam  as prescribed.  INSTRUCTIONS:  Please follow up with physical therapy as recommended for your back, shoulder, and neck pain. Continue taking your medications as prescribed and use a heating pad and massage for additional pain relief. If your symptoms do not improve or worsen, please schedule a follow-up appointment.

## 2023-10-23 DIAGNOSIS — Z419 Encounter for procedure for purposes other than remedying health state, unspecified: Secondary | ICD-10-CM | POA: Diagnosis not present

## 2023-11-02 NOTE — Therapy (Deleted)
 OUTPATIENT PHYSICAL THERAPY SHOULDER EVALUATION   Patient Name: Marissa Garcia MRN: [id number] DOB:[birth year], 58 y.o., female Today's Date: 11/03/2023  END OF SESSION:   Past Medical History:  Diagnosis Date   Burst fracture of T12 vertebra (HCC) 10/13/2018   Complication of anesthesia    Constipation    Headache    Hypercholesteremia    Parkinson's disease [facility])    Past Surgical History:  Procedure Laterality Date   CESAREAN SECTION     Patient Active Problem List   Diagnosis Date Noted   Lumbar pain 03/22/2022   Impingement syndrome of right shoulder 08/05/2019   Chronic neck pain 08/05/2019   Sprain of right rotator cuff capsule 07/14/2019   Body mass index (BMI) 27.0-27.9, adult 07/14/2019   Nutritional counseling 07/13/2019   Parkinson's disease (HCC) 04/22/2019   Lumbar radiculopathy 04/22/2019   Burst fracture of thoracic vertebra (HCC) 10/15/2018   Atrophic vulvovaginitis 01/25/2016   Perimenopausal 01/25/2016   Dental caries 07/05/2010   Headache 03/21/2010   Anxiety state 02/27/2010   ACID REFLUX DISEASE 02/27/2010   WRIST PAIN, RIGHT 02/27/2010   DIZZINESS 02/27/2010   CHEST PAIN 02/15/2010   HYPERCHOLESTEROLEMIA 05/13/2008    PCP: Theotis Haze ORN, NP   REFERRING PROVIDER: Delbert Clam, MD  REFERRING DIAG: M75.41 (ICD-10-CM) - Impingement syndrome of right shoulder M54.6,G89.29 (ICD-10-CM) - Chronic midline thoracic back pain  THERAPY DIAG:  No diagnosis found.  Rationale for Evaluation and Treatment: Rehabilitation  ONSET DATE: chronic  SUBJECTIVE:                                                                                                                                                                                      SUBJECTIVE STATEMENT: Marissa Garcia is a 58 year old female patient of Haze Theotis, NP with a history of Parkinson's disease who presents with back and right shoulder pain.   She experiences severe mid-back pain that  persists at rest, increasing in severity over the past month. Tizanidine  and meloxicam , prescribed during a recent ER visit a week ago, provides minimal relief. No recent heavy lifting is noted. A fall in 2020 led to therapy for her back.   Right shoulder pain occurs, especially when lifting her arm, causing difficulty with exercises and lifting her hand. Numbness and tingling in the right arm occur, with a sensation of needing to 'shake it out'. Pulling sensations in her hand and occasional right-sided neck pain are present. Chest x-ray from 10/13/2023 was unremarkable and shoulder x-ray revealed no findings to explain her symptoms. Hand dominance: {MISC; OT HAND DOMINANCE:[id number]}  PERTINENT HISTORY: 1. Impingement syndrome of right shoulder (Primary) Uncontrolled - Ambulatory referral to  Physical Therapy - meloxicam  (MOBIC ) 7.5 MG tablet; Take 1 tablet (7.5 mg total) by mouth daily.  Dispense: 30 tablet; Refill: 1 - tiZANidine  (ZANAFLEX ) 4 MG tablet; Take 1 tablet by mouth every 8 hours for musculoskeletal pain.  Dispense: 30 tablet; Refill: 0   2. Chronic midline thoracic back pain Uncontrolled Advised to apply heat or ice whichever is tolerated to painful areas. Counseled on evidence of improvement in pain control with regards to yoga, water aerobics, massage, home physical therapy, exercise as tolerated. - Ambulatory referral to Physical Therapy - meloxicam  (MOBIC ) 7.5 MG tablet; Take 1 tablet (7.5 mg total) by mouth daily.  Dispense: 30 tablet; Refill: 1 - tiZANidine  (ZANAFLEX ) 4 MG tablet; Take 1 tablet by mouth every 8 hours for musculoskeletal pain.  Dispense: 30 tablet; Refill: 0  PAIN:  Are you having pain? Yes: NPRS scale: *** Pain location: *** Pain description: *** Aggravating factors: *** Relieving factors: ***  PRECAUTIONS: None  RED FLAGS: None   WEIGHT BEARING RESTRICTIONS: No  FALLS:  Has patient fallen in last 6 months? No  OCCUPATION: ***  PLOF:  Independent  PATIENT GOALS:***  NEXT MD VISIT:   OBJECTIVE:  Note: Objective measures were completed at Evaluation unless otherwise noted.  DIAGNOSTIC FINDINGS:  FINDINGS: There is no evidence of fracture or dislocation. There is no evidence of arthropathy or other focal bone abnormality. Soft tissues are unremarkable.   IMPRESSION: Negative.     Electronically Signed   By: Franky Chard M.D.   On: 10/13/2023 15:46  IMPRESSION: 1. 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 Moderate to high-grade supraspinatus and moderate anterior infraspinatus muscle atrophy. 2. Mild proximal long head of the biceps tendinosis. 3. Moderate acromioclavicular osteoarthritis.     Electronically Signed   By: Tanda Lyons M.D.   On: 06/07/2021 11:28 PATIENT SURVEYS:  Quick Dash:  QUICK DASH  Please rate your ability do the following activities in the last week by selecting the number below the appropriate response.   Activities Rating  Open a tight or new jar.  {Q-dash (1-6):32984}  Do heavy household chores (e.g., wash walls, floors). {Q-dash Development worker, community  Carry a shopping bag or briefcase {Q-dash [facility] your back. {Q-dash (1-6):32984}  Use a knife to cut food. {Q-dash (1-6):32984}  Recreational activities in which you take some force or impact through your arm, shoulder or hand (e.g., golf, hammering, tennis, etc.). {Q-dash (1-6):32984}  During the past week, to what extent has your arm, shoulder or hand problem interfered with your normal social activities with family, friends, neighbors or groups?  {Q-Dash 7:32985}  During the past week, were you limited in your work or other regular daily activities as a result of your arm, shoulder or hand problem? {Q-dash 1:67011}  During the past week, were you limited in your work or other regular daily activities as a result of your arm,  shoulder or hand problem? {Q-dash 0-89:67013}  Tingling (pins and Garcia) in your arm, shoulder or hand. {Q-dash 0-89:67013}  During the past week, how much difficulty have you had sleeping because of the pain in your arm, shoulder or hand?  {Q-dash 11:32987}   (A QuickDASH score may not be calculated if there is greater than 1 missing item.)  Quick Dash Disability/Symptom Score: [(sum of *** (n) responses/*** (n)] x 25 = ***  Minimally Clinically Important Difference (MCID): 15-20 points  (Franchignoni, F. et al. (2013). Minimally clinically  important difference of the disabilities of the arm, shoulder, and hand outcome measures (DASH) and its shortened version (Quick DASH). Journal of Orthopaedic & Sports Physical Therapy, 44(1), 30-39)    POSTURE: ***  UPPER EXTREMITY ROM:   {AROM/PROM:27142} ROM Right eval Left eval  Shoulder flexion    Shoulder extension    Shoulder abduction    Shoulder adduction    Shoulder internal rotation    Shoulder external rotation    Elbow flexion    Elbow extension    Wrist flexion    Wrist extension    Wrist ulnar deviation    Wrist radial deviation    Wrist pronation    Wrist supination    (Blank rows = not tested)  UPPER EXTREMITY MMT:  MMT Right eval Left eval  Shoulder flexion    Shoulder extension    Shoulder abduction    Shoulder adduction    Shoulder internal rotation    Shoulder external rotation    Middle trapezius    Lower trapezius    Elbow flexion    Elbow extension    Wrist flexion    Wrist extension    Wrist ulnar deviation    Wrist radial deviation    Wrist pronation    Wrist supination    Grip strength (lbs)    (Blank rows = not tested)  SHOULDER SPECIAL TESTS: Impingement tests: Neer impingement test: {pos/neg:25230} and Hawkins/Kennedy impingement test: {pos/neg:25230} Rotator cuff assessment: Empty can test: {pos/neg:25230}  PALPATION:  ***                                                                                                                              TREATMENT DATE: ***   PATIENT EDUCATION: Education details: Discussed eval findings, rehab rationale and POC and patient is in agreement  Person educated: Patient Education method: Explanation and Handouts Education comprehension: verbalized understanding and needs further education  HOME EXERCISE PROGRAM: ***  ASSESSMENT:  CLINICAL IMPRESSION: Patient is a *** y.o. *** who was seen today for physical therapy evaluation and treatment for ***.   OBJECTIVE IMPAIRMENTS: {opptimpairments:25111}.   ACTIVITY LIMITATIONS: {activitylimitations:27494}  PERSONAL FACTORS: {Personal factors:25162} are also affecting patient's functional outcome.   REHAB POTENTIAL: Fair ***  CLINICAL DECISION MAKING: Stable/uncomplicated  EVALUATION COMPLEXITY: Low   GOALS: Goals reviewed with patient? No  SHORT TERM GOALS: Target date: ***  *** Baseline: Goal status: INITIAL  2.  *** Baseline:  Goal status: INITIAL  3.  *** Baseline:  Goal status: INITIAL  4.  *** Baseline:  Goal status: INITIAL  5.  *** Baseline:  Goal status: INITIAL  6.  *** Baseline:  Goal status: INITIAL  LONG TERM GOALS: Target date: ***  *** Baseline:  Goal status: INITIAL  2.  *** Baseline:  Goal status: INITIAL  3.  *** Baseline:  Goal status: INITIAL  4.  *** Baseline:  Goal status: INITIAL  5.  *** Baseline:  Goal status:  INITIAL  6.  *** Baseline:  Goal status: INITIAL  PLAN:  PT FREQUENCY: 1x/week  PT DURATION: 6 weeks  PLANNED INTERVENTIONS: 97110-Therapeutic exercises, 97530- Therapeutic activity, 97112- Neuromuscular re-education, 97535- Self Care, 02859- Manual therapy, and Patient/Family education  PLAN FOR NEXT SESSION: HEP review and update, manual techniques as appropriate, aerobic tasks, ROM and flexibility activities, strengthening and PREs, TPDN, gait and balance training as needed     Reyes CHRISTELLA Kohut, PT 11/03/2023, 2:50 PM

## 2023-11-03 ENCOUNTER — Ambulatory Visit: Attending: Family Medicine

## 2023-11-03 DIAGNOSIS — M546 Pain in thoracic spine: Secondary | ICD-10-CM | POA: Insufficient documentation

## 2023-11-03 DIAGNOSIS — R293 Abnormal posture: Secondary | ICD-10-CM | POA: Insufficient documentation

## 2023-11-03 DIAGNOSIS — M25511 Pain in right shoulder: Secondary | ICD-10-CM | POA: Insufficient documentation

## 2023-11-03 DIAGNOSIS — M6281 Muscle weakness (generalized): Secondary | ICD-10-CM | POA: Insufficient documentation

## 2023-11-03 DIAGNOSIS — G8929 Other chronic pain: Secondary | ICD-10-CM | POA: Insufficient documentation

## 2023-11-07 NOTE — Progress Notes (Unsigned)
 Assessment/Plan:   1.  Parkinsons Disease  -Take carbidopa /levodopa  25/100, 2 at 9:30 AM/2 tablets at 12:30pm/1 tablet at 3-4 PM/1 at 6pm-7 PM.  A few days a week, she will take 2 tablets 4 times per day (if she was going to choir or dance, etc.).  - Continue carbidopa /levodopa  50/200 CR at bed  -We discussed that it used to be thought that levodopa  would increase risk of melanoma but now it is believed that Parkinsons itself likely increases risk of melanoma. she is to get regular skin checks.  She was given names and contact information to dermatologist.  -increase exercise  -asks about dbs.  Discussed this.  Would be complicated given neuropsych testing in spanish (only done at [facility] and [employer]).  She is still quite interested.  And I am going to try to find out the best resources to have this done in her native language  -We discussed Vyalev, which is foscarbidopa/foslevodopa pump that is newly FDA approved.  We discussed that it is for motor fluctuations in adults with advanced Parkinson's disease.  She has a lot of issues related to protein.  She has what she estimates as 6 hours/day of off time.  Levodopa  does work well when it is working.  I will see if this is Medicaid approved.  -discussed onapgo as well.  She is not is interested because she really would like to stay with levodopa .  She has had side effects with several other medications.  2.  Insomnia  -restart trazodone , 50 mg nightly.  Currently with day/night reversal.  3  LBP  -She does have moderate to severe spinal stenosis at L4-L5 and is following with orthopedics, Dr. Duwayne and Dr. Bonner, for this.  She has also seen [facility] orthopedics  4.  eye opening apraxia  -discussed botox along with r/b/se.  She was approved for xeomin  and is awaiting for pharmacy shipment    Subjective:   Marissa Garcia was seen today in follow up for Parkinsons disease.  My previous records were reviewed prior to todays visit as well as outside  records available to me.   Patient with her daughter who supplements hx. last visit, I changed around the way she took her levodopa  a little bit in the morning and added bedtime levodopa .  She did not do that and just kept taking 2/2/1/1.  She sometimes does 2 po qid (maybe 1-2 days per week).  She is in church choir 2 days per week and takes it for practice.  States that she keeps trying to get her trazodone  but pharmacy keeps trying to give her something else.  Wants me to refill.  Not exercising much.  She is asking about vyalev because she has trouble with the protein interference and taking an hour to kick in and then stays good for 2 hours and then starts to wear off.  She estimates 6 hours of off time per day.    Current prescribed movement disorder medications: carbidopa /levodopa  25/100, 1 at 9:30 am when awakens before breakfast and 1 right after breakfast at 10:30 am/2 tablets at 12:30pm/1 tablet at 3-4 PM/1 at 6pm-7 PM.  May take extra prn, when going to dance, etc  Carbidopa /levodopa  50/200 CR at bedtime Trazodone , 50 mg nightly   PREVIOUS MEDICATIONS: pramipexole  (EDS); propranolol  (bradycardia and low blood pressure); rotigotine  patch (insurance denied - samples helped); escitalopram  (helped, but when she ran out she felt mood was good and did not want to restart); trazodone ; entacapone  (prescribed  but she never took it); ropinirole  (taken off because of confusion)  ALLERGIES:   Allergies  Allergen Reactions   Propofol     CURRENT MEDICATIONS:  Outpatient Encounter Medications as of 11/11/2023  Medication Sig   carbidopa -levodopa  (SINEMET  CR) 50-200 MG tablet Take 1 tablet by mouth at bedtime.   carbidopa -levodopa  (SINEMET  IR) 25-100 MG tablet 1 at 9:30 am when awakens, 1 after breakfast at 10:30 am/2 tablets at 12:30pm/1 tablet at 3-4 PM/1 at 6pm-7 PM; extra prm   incobotulinumtoxinA (XEOMIN ) 50 units SOLR injection Inject 50 units into the head and neck every 90 days per Dr. Evonnie    [DISCONTINUED] alclomethasone (ACLOVATE ) 0.05 % cream Apply to affected area twice a day for 3 days. (Patient not taking: Reported on 11/10/2023)   [DISCONTINUED] incobotulinumtoxinA (XEOMIN ) 50 units SOLR injection Inject 50 units into the head and neck every 90 days per Dr. Evonnie (Patient not taking: Reported on 11/10/2023)   [DISCONTINUED] meloxicam  (MOBIC ) 7.5 MG tablet Take 1 tablet (7.5 mg total) by mouth daily. (Patient not taking: Reported on 11/10/2023)   [DISCONTINUED] tiZANidine  (ZANAFLEX ) 4 MG tablet Take 1 tablet by mouth every 8 hours for musculoskeletal pain. (Patient not taking: Reported on 11/10/2023)   [DISCONTINUED] traZODone  (DESYREL ) 50 MG tablet TAKE 1 TABLET BY MOUTH AT BEDTIME (Patient not taking: Reported on 11/10/2023)   [DISCONTINUED] zolpidem  (AMBIEN ) 5 MG tablet Take 1 tablet (5 mg total) by mouth at bedtime as needed for sleep. (Patient not taking: No sig reported)   No facility-administered encounter medications on file as of 11/11/2023.    Objective:   PHYSICAL EXAMINATION:    VITALS:   Vitals:   11/11/23 0832  BP: 124/76  Pulse: 74  SpO2: 97%  Weight: 142 lb 9.6 oz (64.7 kg)     GEN:  The patient appears stated age and is in NAD. HEENT:  Normocephalic, atraumatic.  The mucous membranes are moist. The superficial temporal arteries are without ropiness or tenderness.  Seen at 9am and hasn't taken medication yet.    Neurological examination:  Orientation: The patient is alert and oriented x3. Cranial nerves: There is good facial symmetry with min facial hypomimia. The speech is fluent and clear (she does speak English most of the time). Soft palate rises symmetrically and there is no tongue deviation. Hearing is intact to conversational tone. Sensation: Sensation is intact to light touch throughout Motor: Strength is at least antigravity x4.  She was seen at 8:30 am and took medication about 5 min ago  Movement examination: Tone: There is mild increased  tone in the bilateral UE Abnormal movements: there is mild tremor bilateral UE and RLE Coordination:  There is mild decremation on the L Gait and Station: The patient has no difficulty arising out of a deep-seated chair without the use of the hands. The patient's stride length is good with good but purposeful arm swing  I have reviewed and interpreted the following labs independently    Chemistry      Component Value Date/Time   NA 137 11/02/2022 0014   NA 139 03/25/2019 1553   K 3.5 11/02/2022 0014   CL 103 11/02/2022 0014   CO2 27 11/02/2022 0014   BUN 18 11/02/2022 0014   BUN 8 03/25/2019 1553   CREATININE 0.77 11/02/2022 0014   CREATININE 0.64 08/24/2015 1134      Component Value Date/Time   CALCIUM 9.4 11/02/2022 0014   ALKPHOS 56 10/13/2018 2239   AST 32 10/13/2018 2239  ALT 23 10/13/2018 2239   BILITOT 0.6 10/13/2018 2239       Lab Results  Component Value Date   WBC 7.0 11/02/2022   HGB 12.4 11/02/2022   HCT 36.5 11/02/2022   MCV 91.0 11/02/2022   PLT 260 11/02/2022    Lab Results  Component Value Date   TSH 1.060 08/25/2019     Total time spent on today's visit was 45 minutes, including both face-to-face time and nonface-to-face time.  Time included that spent on review of records (prior notes available to me/labs/imaging if pertinent), discussing treatment and goals, answering patient's questions and coordinating care.  Cc:  Theotis Haze ORN, NP

## 2023-11-10 ENCOUNTER — Ambulatory Visit

## 2023-11-10 ENCOUNTER — Other Ambulatory Visit: Payer: Self-pay

## 2023-11-10 DIAGNOSIS — R293 Abnormal posture: Secondary | ICD-10-CM

## 2023-11-10 DIAGNOSIS — M6281 Muscle weakness (generalized): Secondary | ICD-10-CM

## 2023-11-10 DIAGNOSIS — M546 Pain in thoracic spine: Secondary | ICD-10-CM | POA: Diagnosis not present

## 2023-11-10 DIAGNOSIS — G8929 Other chronic pain: Secondary | ICD-10-CM | POA: Diagnosis not present

## 2023-11-10 DIAGNOSIS — M25511 Pain in right shoulder: Secondary | ICD-10-CM | POA: Diagnosis not present

## 2023-11-10 NOTE — Therapy (Signed)
 OUTPATIENT PHYSICAL THERAPY SHOULDER EVALUATION   Patient Name: Marissa Garcia MRN: [id number] DOB:08-Aug-1965, 58 y.o., female Today's Date: 11/10/2023  END OF SESSION:  PT End of Session - 11/10/23 1001     Visit Number 1    Number of Visits 16    Date for PT Re-Evaluation 01/05/24    Authorization Type [employer]    PT Start Time 1002    PT Stop Time 1040    PT Time Calculation (min) 38 min    Activity Tolerance Patient tolerated treatment well    Behavior During Therapy [facility] for tasks assessed/performed          Past Medical History:  Diagnosis Date   Burst fracture of T12 vertebra (HCC) 10/13/2018   Complication of anesthesia    Constipation    Headache    Hypercholesteremia    Parkinson's disease (HCC)    Past Surgical History:  Procedure Laterality Date   CESAREAN SECTION     Patient Active Problem List   Diagnosis Date Noted   Lumbar pain 03/22/2022   Impingement syndrome of right shoulder 08/05/2019   Chronic neck pain 08/05/2019   Sprain of right rotator cuff capsule 07/14/2019   Body mass index (BMI) 27.0-27.9, adult 07/14/2019   Nutritional counseling 07/13/2019   Parkinson's disease (HCC) 04/22/2019   Lumbar radiculopathy 04/22/2019   Burst fracture of thoracic vertebra (HCC) 10/15/2018   Atrophic vulvovaginitis 01/25/2016   Perimenopausal 01/25/2016   Dental caries 07/05/2010   Headache 03/21/2010   Anxiety state 02/27/2010   ACID REFLUX DISEASE 02/27/2010   WRIST PAIN, RIGHT 02/27/2010   DIZZINESS 02/27/2010   CHEST PAIN 02/15/2010   HYPERCHOLESTEROLEMIA 05/13/2008    PCP: Theotis Haze ORN, NP  REFERRING PROVIDER: Delbert Clam, MD  REFERRING DIAG:  M75.41 (ICD-10-CM) - Impingement syndrome of right shoulder  M54.6,G89.29 (ICD-10-CM) - Chronic midline thoracic back pain    THERAPY DIAG:  Chronic right shoulder pain  Muscle weakness (generalized)  Pain in thoracic spine  Abnormal posture  Rationale for Evaluation and  Treatment: Rehabilitation  ONSET DATE: 10/13/2018  SUBJECTIVE:                                                                                                                                                                                      SUBJECTIVE STATEMENT: Patient presents to PT today d/t R shoulder pain and upper back pain that has been present since her fall in 2020. She states that her symptoms started after a fall, but they were just focused on my back at the time, so they didn't really deal with my arm. She reports that it has been getting worse of  the past few months. She would like to know what type of exercises that she can do at home. She has most difficulty with raising her R arm and reaching her back.   Patient verbalizes understanding that she can utilize interpretation services, however she states that she does not need that. She does prefer to receive written information in Spanish.   Hand dominance: Right  PERTINENT HISTORY: Relevant PMHx includes T12 burst fracture, HA, Parkinson's Disease, Chronic Neck Pain, Sprain of Right Rotator Cuff Capsule, R Wrist Pain  PAIN:  Are you having pain?  Yes: NPRS scale: 10/10 Pain location: R shoulder and BIL TS, occasionally radiates to R side of neck Pain description: burning, R hand paresthesia. Aggravating factors: lifting arm  Relieving factors: topical cream  PRECAUTIONS: None  RED FLAGS: None   WEIGHT BEARING RESTRICTIONS: No  FALLS:  Has patient fallen in last 6 months? No  LIVING ENVIRONMENT: Lives with: lives with their family and lives with their daughter Lives in: House/apartment Stairs: Yes: Internal: 1 flight steps; not using d/t bedroom on first floor  Has following equipment at home: None  OCCUPATION: Cleaning houses  PLOF: Independent  PATIENT GOALS:To have less pain, be able to lift her arm, wash her back and perform work activities   NEXT MD VISIT: 11/11/2023 Neurology Appt  OBJECTIVE:   Note: Objective measures were completed at Evaluation unless otherwise noted.  DIAGNOSTIC FINDINGS:  10/13/2023: R SHOULDER XRAY FINDINGS: There is no evidence of fracture or dislocation. There is no evidence of arthropathy or other focal bone abnormality. Soft tissues are unremarkable.   IMPRESSION: Negative.  10/13/2023: CHEST XRAY FINDINGS: The heart size and mediastinal contours are within normal limits. Both lungs are clear. The visualized skeletal structures are unremarkable.   IMPRESSION: No active cardiopulmonary disease.   PATIENT SURVEYS:  Quick DASH: 56.8  COGNITION: Overall cognitive status: Within functional limits for tasks assessed     SENSATION: Not tested  POSTURE: Rounded Shoulders  UPPER EXTREMITY ROM:   Active ROM Right eval Left eval  Shoulder flexion A 84 (P 110) WFL  Shoulder extension    Shoulder abduction A 80 (P 85) WFL  Shoulder adduction    Shoulder internal rotation Functional reach to L3   Shoulder external rotation    Elbow flexion    Elbow extension    Wrist flexion    Wrist extension    Wrist ulnar deviation    Wrist radial deviation    Wrist pronation    Wrist supination    (Blank rows = not tested)  UPPER EXTREMITY MMT:  MMT Right eval Left eval  Shoulder flexion    Shoulder extension    Shoulder abduction    Shoulder adduction    Shoulder internal rotation    Shoulder external rotation    Middle trapezius    Lower trapezius    Elbow flexion    Elbow extension    Wrist flexion    Wrist extension    Wrist ulnar deviation    Wrist radial deviation    Wrist pronation    Wrist supination    Grip strength (lbs)    (Blank rows = not tested)  SHOULDER SPECIAL TESTS: Impingement tests: Neer impingement test: positive  and Hawkins/Kennedy impingement test: positive  Positive result, subsequent to intolerance of testing positions d/t painful passive and active shoulder elevation AROM    Mild symptom relief  following gentle GH distraction and Grade II lateral mobilization   PALPATION:  Pain with  palpation throughout TS CPA; painfree throughout LS CPA Pain with palpation along medial border of BIL scapula, and BIL parascapular mm.                                                                                                                              TREATMENT DATE:   [facility] Adult PT Treatment:                                                DATE: 11/10/2023   Initial evaluation: see patient education and home exercise program as noted below   Manual Therapy:    gentle GH distraction and Grade II lateral mobilization   PATIENT EDUCATION: Education details: reviewed initial home exercise program; discussion of POC, prognosis and goals for skilled PT   Person educated: Patient Education method: Explanation, Demonstration, and Handouts Education comprehension: verbalized understanding, returned demonstration, and needs further education  HOME EXERCISE PROGRAM: Access Code: [email or web address] URL: [email or web address] Date: 11/10/2023 Prepared by: Marko Molt  Exercises - Seated Scapular Retraction  - 1 x daily - 7 x weekly - 2 sets - 10 reps - 3 sec hold - Seated Shoulder Shrugs  - 1 x daily - 7 x weekly - 2 sets - 10 reps - 3 sec hold - Seated Bilateral Shoulder Flexion Towel Slide at Table Top  - 1 x daily - 7 x weekly - 2 sets - 10 reps - 3 sec hold  ASSESSMENT:  CLINICAL IMPRESSION: Zitlali is a 58 y.o. female who was seen today for physical therapy evaluation and treatment for persistent R shoulder and Thoracic pain. She is demonstrating decreased R shoulder AROM, inability to tolerate testing position for shoulder impingement tests, and pain with palpation along BIL TS spine. She has related pain and difficulty with overhead reaching, lifting, upper body dressing, bathing, household management and occupational duties. She does respond well to gentle GH distraction and Grade  II lateral mobilization, reporting mild pain relief.  She requires skilled PT services at this time to address relevant deficits and improve overall function.     OBJECTIVE IMPAIRMENTS: decreased activity tolerance, decreased ROM, decreased strength, impaired UE functional use, postural dysfunction, and pain.   ACTIVITY LIMITATIONS: carrying, lifting, sleeping, bathing, and dressing  PARTICIPATION LIMITATIONS: meal prep, cleaning, laundry, interpersonal relationship, shopping, and occupation  PERSONAL FACTORS: Profession, Time since onset of injury/illness/exacerbation, and 3+ comorbidities: Relevant PMHx includes T12 burst fracture, HA, Parkinson's Disease, Chronic Neck Pain, Sprain of Right Rotator Cuff Capsule, R Wrist Pain are also affecting patient's functional outcome.   REHAB POTENTIAL: Fair    CLINICAL DECISION MAKING: Evolving/moderate complexity  EVALUATION COMPLEXITY: Moderate   GOALS: Goals reviewed with patient? YES  SHORT TERM GOALS: Target date: 12/08/2023   Patient will be independent with initial home program at least 3 days/week.  Baseline: provided at eval Goal Status: INITIAL   2.  Patient will demonstrate improved postural awareness for at least 15 minutes while seated without need for cueing from PT.  Baseline: see objective measures Goal Status: INITIAL   3.  Patient will demonstrate improved R Shoulder elevation AROM to at least 145 degrees.  Baseline: 84 flexion, 80 abduction  Goal status: INITIAL   LONG TERM GOALS: Target date: 01/05/2024   Patient will report improved overall functional ability with QuickDASH score of 40 or less.  Baseline: 56 Goal Status: INITIAL   2.  Patient demonstrate improved functional IR reach to at least T12 in order to improved ability to wash her back, without exacerbation of symptoms.  Baseline: T3 Goal status: INITIAL  3.  Patient will demonstrate ability to perform overhead lifting of at least 10# using  appropriate body mechanics and with no more than minimal pain in order to safely perform normal daily/occupational tasks.   Baseline: unable to lift higher than chest height  Goal Status: INITIAL    4.  Patient will report ability to perform normal occupational duties, with no more than 4/10 pain at end of most work days.   Baseline: 10/10 pain   Goal Status: INITIAL     PLAN:  PT FREQUENCY: 1-2x/week  PT DURATION: 8 weeks  PLANNED INTERVENTIONS: 97164- PT Re-evaluation, 97110-Therapeutic exercises, 97530- Therapeutic activity, 97112- Neuromuscular re-education, 97535- Self Care, [id number]- Manual therapy, [id number]- Electrical stimulation (unattended), Patient/Family education, Taping, Joint mobilization, Spinal mobilization, Cryotherapy, and Moist heat   [employer] Authorization   Choose one: Rehabilitative  Standardized Assessment or Functional Outcome Tool: See Pain Assessment and Quick DASH  Score or Percent Disability: 56.8  Body Parts Treated (Select each separately):  Shoulder. Overall deficits/functional limitations for body part selected: moderate Cervicothoracic. Overall deficits/functional limitations for body part selected: moderate    PLAN FOR NEXT SESSION: address periscapular/postural mm endurance, address TS and R shoulder mobility, manual therapy and modalities as indicated, otherwise progress towards established rehab goals.    Marko Molt, PT, DPT  11/10/2023 3:39 PM

## 2023-11-11 ENCOUNTER — Other Ambulatory Visit: Payer: Self-pay

## 2023-11-11 ENCOUNTER — Ambulatory Visit: Admitting: Neurology

## 2023-11-11 VITALS — BP 124/76 | HR 74 | Ht 62.0 in | Wt 142.6 lb

## 2023-11-11 DIAGNOSIS — G4701 Insomnia due to medical condition: Secondary | ICD-10-CM

## 2023-11-11 DIAGNOSIS — G20B2 Parkinson's disease with dyskinesia, with fluctuations: Secondary | ICD-10-CM

## 2023-11-11 DIAGNOSIS — R482 Apraxia: Secondary | ICD-10-CM

## 2023-11-11 DIAGNOSIS — G20A1 Parkinson's disease without dyskinesia, without mention of fluctuations: Secondary | ICD-10-CM | POA: Diagnosis not present

## 2023-11-11 DIAGNOSIS — G245 Blepharospasm: Secondary | ICD-10-CM | POA: Diagnosis not present

## 2023-11-11 MED ORDER — XEOMIN 50 UNITS IM SOLR
INTRAMUSCULAR | 3 refills | Status: AC
  Filled 2023-11-12: qty 1, 90d supply, fill #0
  Filled 2024-05-21: qty 1, 90d supply, fill #1

## 2023-11-11 MED ORDER — TRAZODONE HCL 50 MG PO TABS: 50.0000 mg | ORAL_TABLET | Freq: Every day | ORAL | 1 refills | Status: AC

## 2023-11-11 MED ORDER — CARBIDOPA-LEVODOPA 25-100 MG PO TABS: ORAL_TABLET | ORAL | 1 refills | Status: DC

## 2023-11-11 NOTE — Patient Instructions (Signed)

## 2023-11-12 ENCOUNTER — Other Ambulatory Visit: Payer: Self-pay | Admitting: Pharmacy Technician

## 2023-11-12 ENCOUNTER — Other Ambulatory Visit: Payer: Self-pay

## 2023-11-12 ENCOUNTER — Other Ambulatory Visit (HOSPITAL_COMMUNITY): Payer: Self-pay

## 2023-11-12 NOTE — Progress Notes (Signed)
 Specialty Pharmacy Initial Fill Coordination Note  Marissa Garcia is a 58 y.o. female contacted today regarding initial fill of specialty medication(s) IncobotulinumtoxinA (Xeomin )   Patient requested Courier to Provider Office   Delivery date: 11/19/23   Verified address: [facility]  [address] [place] [address] [place], [place], [address]   Medication will be filled on 7.8.25.   Patient is aware of 4 copayment.

## 2023-11-18 ENCOUNTER — Ambulatory Visit: Attending: Nurse Practitioner | Admitting: Physical Therapy

## 2023-11-18 ENCOUNTER — Other Ambulatory Visit: Payer: Self-pay

## 2023-11-18 ENCOUNTER — Encounter: Payer: Self-pay | Admitting: Physical Therapy

## 2023-11-18 DIAGNOSIS — M6281 Muscle weakness (generalized): Secondary | ICD-10-CM | POA: Insufficient documentation

## 2023-11-18 DIAGNOSIS — M546 Pain in thoracic spine: Secondary | ICD-10-CM | POA: Diagnosis not present

## 2023-11-18 DIAGNOSIS — M25511 Pain in right shoulder: Secondary | ICD-10-CM | POA: Insufficient documentation

## 2023-11-18 DIAGNOSIS — R293 Abnormal posture: Secondary | ICD-10-CM | POA: Insufficient documentation

## 2023-11-18 DIAGNOSIS — G8929 Other chronic pain: Secondary | ICD-10-CM | POA: Diagnosis not present

## 2023-11-18 NOTE — Therapy (Signed)
 OUTPATIENT PHYSICAL THERAPY TREATMENT   Patient Name: Marissa Garcia MRN: [id number] DOB:1965/11/11, 58 y.o., female Today's Date: 11/18/2023  END OF SESSION:  PT End of Session - 11/18/23 1038     Visit Number 2    Number of Visits 16    Date for PT Re-Evaluation 01/05/24    Authorization Type [facility]    Authorization Time Period 10 PT visits from 11/10/23-01/09/24    Authorization - Visit Number 2    Authorization - Number of Visits 10    PT Start Time 1040    PT Stop Time 1130    PT Time Calculation (min) 50 min           Past Medical History:  Diagnosis Date   Burst fracture of T12 vertebra (HCC) 10/13/2018   Complication of anesthesia    Constipation    Headache    Hypercholesteremia    Parkinson's disease (HCC)    Past Surgical History:  Procedure Laterality Date   CESAREAN SECTION     Patient Active Problem List   Diagnosis Date Noted   Lumbar pain 03/22/2022   Impingement syndrome of right shoulder 08/05/2019   Chronic neck pain 08/05/2019   Sprain of right rotator cuff capsule 07/14/2019   Body mass index (BMI) 27.0-27.9, adult 07/14/2019   Nutritional counseling 07/13/2019   Parkinson's disease (HCC) 04/22/2019   Lumbar radiculopathy 04/22/2019   Burst fracture of thoracic vertebra (HCC) 10/15/2018   Atrophic vulvovaginitis 01/25/2016   Perimenopausal 01/25/2016   Dental caries 07/05/2010   Headache 03/21/2010   Anxiety state 02/27/2010   ACID REFLUX DISEASE 02/27/2010   WRIST PAIN, RIGHT 02/27/2010   DIZZINESS 02/27/2010   CHEST PAIN 02/15/2010   HYPERCHOLESTEROLEMIA 05/13/2008    PCP: Theotis Haze ORN, NP  REFERRING PROVIDER: Delbert Clam, MD  REFERRING DIAG:  M75.41 (ICD-10-CM) - Impingement syndrome of right shoulder  M54.6,G89.29 (ICD-10-CM) - Chronic midline thoracic back pain    THERAPY DIAG:  Chronic right shoulder pain  Muscle weakness (generalized)  Pain in thoracic spine  Abnormal posture  Rationale for Evaluation  and Treatment: Rehabilitation  ONSET DATE: 10/13/2018  SUBJECTIVE:                                                                                                                                                                                     Per eval: Patient presents to PT today d/t R shoulder pain and upper back pain that has been present since her fall in 2020. She states that her symptoms started after a fall, but they were just focused on my back at the time, so they didn't really deal with my arm. She  reports that it has been getting worse of the past few months. She would like to know what type of exercises that she can do at home. She has most difficulty with raising her R arm and reaching her back.  Patient verbalizes understanding that she can utilize interpretation services, however she states that she does not need that. She does prefer to receive written information in Spanish.  Hand dominance: Right  SUBJECTIVE STATEMENT: 11/18/2023: states symptoms feeling about the same. States she has done her HEP a couple of times - feels good during but no change in pain afterwards. No other new updates.    PERTINENT HISTORY: Relevant PMHx includes T12 burst fracture, HA, Parkinson's Disease, Chronic Neck Pain, Sprain of Right Rotator Cuff Capsule, R Wrist Pain  PAIN:  Are you having pain? 8/10 R shoulder/neck   Per eval: Yes: NPRS scale: 10/10 Pain location: R shoulder and BIL TS, occasionally radiates to R side of neck Pain description: burning, R hand paresthesia. Aggravating factors: lifting arm  Relieving factors: topical cream  PRECAUTIONS: None  RED FLAGS: None   WEIGHT BEARING RESTRICTIONS: No  FALLS:  Has patient fallen in last 6 months? No  LIVING ENVIRONMENT: Lives with: lives with their family and lives with their daughter Lives in: House/apartment Stairs: Yes: Internal: 1 flight steps; not using d/t bedroom on first floor  Has following equipment at home:  None  OCCUPATION: Cleaning houses  PLOF: Independent  PATIENT GOALS:To have less pain, be able to lift her arm, wash her back and perform work activities   NEXT MD VISIT: 11/11/2023 Neurology Appt  OBJECTIVE:  Note: Objective measures were completed at Evaluation unless otherwise noted.  DIAGNOSTIC FINDINGS:  10/13/2023: R SHOULDER XRAY FINDINGS: There is no evidence of fracture or dislocation. There is no evidence of arthropathy or other focal bone abnormality. Soft tissues are unremarkable.   IMPRESSION: Negative.  10/13/2023: CHEST XRAY FINDINGS: The heart size and mediastinal contours are within normal limits. Both lungs are clear. The visualized skeletal structures are unremarkable.   IMPRESSION: No active cardiopulmonary disease.   PATIENT SURVEYS:  Quick DASH: 56.8  COGNITION: Overall cognitive status: Within functional limits for tasks assessed     SENSATION: Not tested  POSTURE: Rounded Shoulders  UPPER EXTREMITY ROM:   Active ROM Right eval Left eval R 11/18/23  Shoulder flexion A 84 (P 110) WFL A: 114 deg *  Shoulder extension     Shoulder abduction A 80 (P 85) WFL   Shoulder adduction     Shoulder internal rotation Functional reach to L3    Shoulder external rotation     Elbow flexion     Elbow extension     Wrist flexion     Wrist extension     Wrist ulnar deviation     Wrist radial deviation     Wrist pronation     Wrist supination     (Blank rows = not tested)  UPPER EXTREMITY MMT:  MMT Right eval Left eval  Shoulder flexion    Shoulder extension    Shoulder abduction    Shoulder adduction    Shoulder internal rotation    Shoulder external rotation    Middle trapezius    Lower trapezius    Elbow flexion    Elbow extension    Wrist flexion    Wrist extension    Wrist ulnar deviation    Wrist radial deviation    Wrist pronation    Wrist supination  Grip strength (lbs)    (Blank rows = not tested)  SHOULDER SPECIAL  TESTS: Impingement tests: Neer impingement test: positive  and Hawkins/Kennedy impingement test: positive  Positive result, subsequent to intolerance of testing positions d/t painful passive and active shoulder elevation AROM    Mild symptom relief following gentle GH distraction and Grade II lateral mobilization   PALPATION:  Pain with palpation throughout TS CPA; painfree throughout LS CPA Pain with palpation along medial border of BIL scapula, and BIL parascapular mm.                                                                                                                              TREATMENT DATE:   [facility] Adult PT Treatment:                                                DATE: 11/18/23 Therapeutic Exercise: Scap retraction x10 cues for pacing Shrug x10 cues for emphasis on depression Shoulder flexion slide at counter x10 Shoulder abduction slide at counter x10 cues for reduced IR compensations Red band row 2x10 cues for reduced elbow compensations Supine shoulder flexion w/ dowel 2x8 Seated thoracic ext w/ chair x8 cues for appropriate support and neck positioning HEP discussion/education  Manual Therapy: Seated; gentle STM and trigger point release R infraspinatus, deltoid; gentle STM LS, UT, and teres    [facility] Adult PT Treatment:                                                DATE: 11/10/2023   Initial evaluation: see patient education and home exercise program as noted below   Manual Therapy:    gentle GH distraction and Grade II lateral mobilization   PATIENT EDUCATION: Education details: rationale for interventions, HEP  Person educated: Patient Education method: Explanation, Demonstration, Tactile cues, Verbal cues Education comprehension: verbalized understanding, returned demonstration, verbal cues required, tactile cues required, and needs further education     HOME EXERCISE PROGRAM: Access Code: [email or web address] URL: [email or web address] Date:  11/10/2023 Prepared by: Marko Molt  Exercises - Seated Scapular Retraction  - 1 x daily - 7 x weekly - 2 sets - 10 reps - 3 sec hold - Seated Shoulder Shrugs  - 1 x daily - 7 x weekly - 2 sets - 10 reps - 3 sec hold - Seated Bilateral Shoulder Flexion Towel Slide at Table Top  - 1 x daily - 7 x weekly - 2 sets - 10 reps - 3 sec hold  ASSESSMENT:  CLINICAL IMPRESSION: 11/18/2023: Pt arrives w/ report of baseline pain but does demonstrate notable improvement in AROM compared to eval. Today we focus on expansion of GH/periscapular program  which she does well with overall. Most relief with flexion stretching, and she demonstrates improved tolerance to abduction ROM with cues to mitigate subacromial compressive forces. No overt increases in pain with particular activity although by end of session she does endorse mild increase in pain. This is transiently improved w/ manual as above, no adverse events. Recommend continuing along current POC in order to address relevant deficits and improve functional tolerance. Pt departs today's session in no acute distress, all voiced questions/concerns addressed appropriately from PT perspective.    Per eval: Cristela is a 58 y.o. female who was seen today for physical therapy evaluation and treatment for persistent R shoulder and Thoracic pain. She is demonstrating decreased R shoulder AROM, inability to tolerate testing position for shoulder impingement tests, and pain with palpation along BIL TS spine. She has related pain and difficulty with overhead reaching, lifting, upper body dressing, bathing, household management and occupational duties. She does respond well to gentle GH distraction and Grade II lateral mobilization, reporting mild pain relief.  She requires skilled PT services at this time to address relevant deficits and improve overall function.     OBJECTIVE IMPAIRMENTS: decreased activity tolerance, decreased ROM, decreased strength, impaired UE functional  use, postural dysfunction, and pain.   ACTIVITY LIMITATIONS: carrying, lifting, sleeping, bathing, and dressing  PARTICIPATION LIMITATIONS: meal prep, cleaning, laundry, interpersonal relationship, shopping, and occupation  PERSONAL FACTORS: Profession, Time since onset of injury/illness/exacerbation, and 3+ comorbidities: Relevant PMHx includes T12 burst fracture, HA, Parkinson's Disease, Chronic Neck Pain, Sprain of Right Rotator Cuff Capsule, R Wrist Pain are also affecting patient's functional outcome.   REHAB POTENTIAL: Fair    CLINICAL DECISION MAKING: Evolving/moderate complexity  EVALUATION COMPLEXITY: Moderate   GOALS: Goals reviewed with patient? YES  SHORT TERM GOALS: Target date: 12/08/2023   Patient will be independent with initial home program at least 3 days/week.  Baseline: provided at eval Goal Status: INITIAL   2.  Patient will demonstrate improved postural awareness for at least 15 minutes while seated without need for cueing from PT.  Baseline: see objective measures Goal Status: INITIAL   3.  Patient will demonstrate improved R Shoulder elevation AROM to at least 145 degrees.  Baseline: 84 flexion, 80 abduction  Goal status: INITIAL   LONG TERM GOALS: Target date: 01/05/2024   Patient will report improved overall functional ability with QuickDASH score of 40 or less.  Baseline: 56 Goal Status: INITIAL   2.  Patient demonstrate improved functional IR reach to at least T12 in order to improved ability to wash her back, without exacerbation of symptoms.  Baseline: T3 Goal status: INITIAL  3.  Patient will demonstrate ability to perform overhead lifting of at least 10# using appropriate body mechanics and with no more than minimal pain in order to safely perform normal daily/occupational tasks.   Baseline: unable to lift higher than chest height  Goal Status: INITIAL    4.  Patient will report ability to perform normal occupational duties, with no more  than 4/10 pain at end of most work days.   Baseline: 10/10 pain   Goal Status: INITIAL     PLAN:  PT FREQUENCY: 1-2x/week  PT DURATION: 8 weeks  PLANNED INTERVENTIONS: 97164- PT Re-evaluation, 97110-Therapeutic exercises, 97530- Therapeutic activity, 97112- Neuromuscular re-education, 97535- Self Care, [id number]- Manual therapy, [id number]- Electrical stimulation (unattended), Patient/Family education, Taping, Joint mobilization, Spinal mobilization, Cryotherapy, and Moist heat   [employer] Authorization   Choose one: Rehabilitative  Standardized Assessment or Functional Outcome  Tool: See Pain Assessment and Quick DASH  Score or Percent Disability: 56.8  Body Parts Treated (Select each separately):  Shoulder. Overall deficits/functional limitations for body part selected: moderate Cervicothoracic. Overall deficits/functional limitations for body part selected: moderate    PLAN FOR NEXT SESSION: address periscapular/postural mm endurance, address TS and R shoulder mobility, manual therapy and modalities as indicated, otherwise progress towards established rehab goals.    Alm DELENA Jenny PT, DPT 11/18/2023 11:50 AM

## 2023-11-19 NOTE — Therapy (Signed)
 OUTPATIENT PHYSICAL THERAPY TREATMENT   Patient Name: Marissa Garcia MRN: [id number] DOB:1966/04/23, 58 y.o., female Today's Date: 11/19/2023  END OF SESSION:     Past Medical History:  Diagnosis Date   Burst fracture of T12 vertebra (HCC) 10/13/2018   Complication of anesthesia    Constipation    Headache    Hypercholesteremia    Parkinson's disease [facility])    Past Surgical History:  Procedure Laterality Date   CESAREAN SECTION     Patient Active Problem List   Diagnosis Date Noted   Lumbar pain 03/22/2022   Impingement syndrome of right shoulder 08/05/2019   Chronic neck pain 08/05/2019   Sprain of right rotator cuff capsule 07/14/2019   Body mass index (BMI) 27.0-27.9, adult 07/14/2019   Nutritional counseling 07/13/2019   Parkinson's disease (HCC) 04/22/2019   Lumbar radiculopathy 04/22/2019   Burst fracture of thoracic vertebra (HCC) 10/15/2018   Atrophic vulvovaginitis 01/25/2016   Perimenopausal 01/25/2016   Dental caries 07/05/2010   Headache 03/21/2010   Anxiety state 02/27/2010   ACID REFLUX DISEASE 02/27/2010   WRIST PAIN, RIGHT 02/27/2010   DIZZINESS 02/27/2010   CHEST PAIN 02/15/2010   HYPERCHOLESTEROLEMIA 05/13/2008    PCP: Theotis Haze ORN, NP  REFERRING PROVIDER: Delbert Clam, MD  REFERRING DIAG:  M75.41 (ICD-10-CM) - Impingement syndrome of right shoulder  M54.6,G89.29 (ICD-10-CM) - Chronic midline thoracic back pain    THERAPY DIAG:  No diagnosis found.  Rationale for Evaluation and Treatment: Rehabilitation  ONSET DATE: 10/13/2018  SUBJECTIVE:                                                                                                                                                                                     Per eval: Patient presents to PT today d/t R shoulder pain and upper back pain that has been present since her fall in 2020. She states that her symptoms started after a fall, but they were just focused on my back at the  time, so they didn't really deal with my arm. She reports that it has been getting worse of the past few months. She would like to know what type of exercises that she can do at home. She has most difficulty with raising her R arm and reaching her back.  Patient verbalizes understanding that she can utilize interpretation services, however she states that she does not need that. She does prefer to receive written information in Spanish.  Hand dominance: Right  SUBJECTIVE STATEMENT: 11/19/2023: ***  *** states symptoms feeling about the same. States she has done her HEP a couple of times - feels good during but no change in pain afterwards. No other  new updates.    PERTINENT HISTORY: Relevant PMHx includes T12 burst fracture, HA, Parkinson's Disease, Chronic Neck Pain, Sprain of Right Rotator Cuff Capsule, R Wrist Pain  PAIN:  Are you having pain? 8/10 R shoulder/neck ***  Per eval: Yes: NPRS scale: 10/10 Pain location: R shoulder and BIL TS, occasionally radiates to R side of neck Pain description: burning, R hand paresthesia. Aggravating factors: lifting arm  Relieving factors: topical cream  PRECAUTIONS: None  RED FLAGS: None   WEIGHT BEARING RESTRICTIONS: No  FALLS:  Has patient fallen in last 6 months? No  LIVING ENVIRONMENT: Lives with: lives with their family and lives with their daughter Lives in: House/apartment Stairs: Yes: Internal: 1 flight steps; not using d/t bedroom on first floor  Has following equipment at home: None  OCCUPATION: Cleaning houses  PLOF: Independent  PATIENT GOALS:To have less pain, be able to lift her arm, wash her back and perform work activities   NEXT MD VISIT: 11/11/2023 Neurology Appt  OBJECTIVE:  Note: Objective measures were completed at Evaluation unless otherwise noted.  DIAGNOSTIC FINDINGS:  10/13/2023: R SHOULDER XRAY FINDINGS: There is no evidence of fracture or dislocation. There is no evidence of arthropathy or other  focal bone abnormality. Soft tissues are unremarkable.   IMPRESSION: Negative.  10/13/2023: CHEST XRAY FINDINGS: The heart size and mediastinal contours are within normal limits. Both lungs are clear. The visualized skeletal structures are unremarkable.   IMPRESSION: No active cardiopulmonary disease.   PATIENT SURVEYS:  Quick DASH: 56.8  COGNITION: Overall cognitive status: Within functional limits for tasks assessed     SENSATION: Not tested  POSTURE: Rounded Shoulders  UPPER EXTREMITY ROM:   Active ROM Right eval Left eval R 11/18/23  Shoulder flexion A 84 (P 110) WFL A: 114 deg *  Shoulder extension     Shoulder abduction A 80 (P 85) WFL   Shoulder adduction     Shoulder internal rotation Functional reach to L3    Shoulder external rotation     Elbow flexion     Elbow extension     Wrist flexion     Wrist extension     Wrist ulnar deviation     Wrist radial deviation     Wrist pronation     Wrist supination     (Blank rows = not tested)  UPPER EXTREMITY MMT:  MMT Right eval Left eval  Shoulder flexion    Shoulder extension    Shoulder abduction    Shoulder adduction    Shoulder internal rotation    Shoulder external rotation    Middle trapezius    Lower trapezius    Elbow flexion    Elbow extension    Wrist flexion    Wrist extension    Wrist ulnar deviation    Wrist radial deviation    Wrist pronation    Wrist supination    Grip strength (lbs)    (Blank rows = not tested)  SHOULDER SPECIAL TESTS: Impingement tests: Neer impingement test: positive  and Hawkins/Kennedy impingement test: positive  Positive result, subsequent to intolerance of testing positions d/t painful passive and active shoulder elevation AROM    Mild symptom relief following gentle GH distraction and Grade II lateral mobilization   PALPATION:  Pain with palpation throughout TS CPA; painfree throughout LS CPA Pain with palpation along medial border of BIL scapula, and  BIL parascapular mm.  TREATMENT DATE:   [facility] Adult PT Treatment:                                                DATE: 11/20/23 Therapeutic Exercise: *** Manual Therapy: *** Neuromuscular re-ed: *** Therapeutic Activity: *** Modalities: *** Self Care: ***    [facility] Adult PT Treatment:                                                DATE: 11/18/23 Therapeutic Exercise: Scap retraction x10 cues for pacing Shrug x10 cues for emphasis on depression Shoulder flexion slide at counter x10 Shoulder abduction slide at counter x10 cues for reduced IR compensations Red band row 2x10 cues for reduced elbow compensations Supine shoulder flexion w/ dowel 2x8 Seated thoracic ext w/ chair x8 cues for appropriate support and neck positioning HEP discussion/education  Manual Therapy: Seated; gentle STM and trigger point release R infraspinatus, deltoid; gentle STM LS, UT, and teres    [facility] Adult PT Treatment:                                                DATE: 11/10/2023   Initial evaluation: see patient education and home exercise program as noted below   Manual Therapy:    gentle GH distraction and Grade II lateral mobilization   PATIENT EDUCATION: Education details: rationale for interventions, HEP  Person educated: Patient Education method: Explanation, Demonstration, Tactile cues, Verbal cues Education comprehension: verbalized understanding, returned demonstration, verbal cues required, tactile cues required, and needs further education     HOME EXERCISE PROGRAM: Access Code: [email or web address] URL: [email or web address] Date: 11/10/2023 Prepared by: Marko Molt  Exercises - Seated Scapular Retraction  - 1 x daily - 7 x weekly - 2 sets - 10 reps - 3 sec hold - Seated Shoulder Shrugs  - 1 x daily - 7 x weekly - 2 sets - 10 reps - 3 sec hold - Seated  Bilateral Shoulder Flexion Towel Slide at Table Top  - 1 x daily - 7 x weekly - 2 sets - 10 reps - 3 sec hold  ASSESSMENT:  CLINICAL IMPRESSION: 11/19/2023: ***  *** Pt arrives w/ report of baseline pain but does demonstrate notable improvement in AROM compared to eval. Today we focus on expansion of GH/periscapular program which she does well with overall. Most relief with flexion stretching, and she demonstrates improved tolerance to abduction ROM with cues to mitigate subacromial compressive forces. No overt increases in pain with particular activity although by end of session she does endorse mild increase in pain. This is transiently improved w/ manual as above, no adverse events. Recommend continuing along current POC in order to address relevant deficits and improve functional tolerance. Pt departs today's session in no acute distress, all voiced questions/concerns addressed appropriately from PT perspective.    Per eval: Zitlali is a 58 y.o. female who was seen today for physical therapy evaluation and treatment for persistent R shoulder and Thoracic pain. She is demonstrating decreased R shoulder AROM, inability to tolerate testing position for shoulder impingement tests, and pain with palpation  along BIL TS spine. She has related pain and difficulty with overhead reaching, lifting, upper body dressing, bathing, household management and occupational duties. She does respond well to gentle GH distraction and Grade II lateral mobilization, reporting mild pain relief.  She requires skilled PT services at this time to address relevant deficits and improve overall function.     OBJECTIVE IMPAIRMENTS: decreased activity tolerance, decreased ROM, decreased strength, impaired UE functional use, postural dysfunction, and pain.   ACTIVITY LIMITATIONS: carrying, lifting, sleeping, bathing, and dressing  PARTICIPATION LIMITATIONS: meal prep, cleaning, laundry, interpersonal relationship, shopping, and  occupation  PERSONAL FACTORS: Profession, Time since onset of injury/illness/exacerbation, and 3+ comorbidities: Relevant PMHx includes T12 burst fracture, HA, Parkinson's Disease, Chronic Neck Pain, Sprain of Right Rotator Cuff Capsule, R Wrist Pain are also affecting patient's functional outcome.   REHAB POTENTIAL: Fair    CLINICAL DECISION MAKING: Evolving/moderate complexity  EVALUATION COMPLEXITY: Moderate   GOALS: Goals reviewed with patient? YES  SHORT TERM GOALS: Target date: 12/08/2023   Patient will be independent with initial home program at least 3 days/week.  Baseline: provided at eval Goal Status: INITIAL   2.  Patient will demonstrate improved postural awareness for at least 15 minutes while seated without need for cueing from PT.  Baseline: see objective measures Goal Status: INITIAL   3.  Patient will demonstrate improved R Shoulder elevation AROM to at least 145 degrees.  Baseline: 84 flexion, 80 abduction  Goal status: INITIAL   LONG TERM GOALS: Target date: 01/05/2024   Patient will report improved overall functional ability with QuickDASH score of 40 or less.  Baseline: 56 Goal Status: INITIAL   2.  Patient demonstrate improved functional IR reach to at least T12 in order to improved ability to wash her back, without exacerbation of symptoms.  Baseline: T3 Goal status: INITIAL  3.  Patient will demonstrate ability to perform overhead lifting of at least 10# using appropriate body mechanics and with no more than minimal pain in order to safely perform normal daily/occupational tasks.   Baseline: unable to lift higher than chest height  Goal Status: INITIAL    4.  Patient will report ability to perform normal occupational duties, with no more than 4/10 pain at end of most work days.   Baseline: 10/10 pain   Goal Status: INITIAL     PLAN:  PT FREQUENCY: 1-2x/week  PT DURATION: 8 weeks  PLANNED INTERVENTIONS: 97164- PT Re-evaluation,  97110-Therapeutic exercises, 97530- Therapeutic activity, 97112- Neuromuscular re-education, 97535- Self Care, [id number]- Manual therapy, [id number]- Electrical stimulation (unattended), Patient/Family education, Taping, Joint mobilization, Spinal mobilization, Cryotherapy, and Moist heat   [employer] Authorization   Choose one: Rehabilitative  Standardized Assessment or Functional Outcome Tool: See Pain Assessment and Quick DASH  Score or Percent Disability: 56.8  Body Parts Treated (Select each separately):  Shoulder. Overall deficits/functional limitations for body part selected: moderate Cervicothoracic. Overall deficits/functional limitations for body part selected: moderate    PLAN FOR NEXT SESSION: address periscapular/postural mm endurance, address TS and R shoulder mobility, manual therapy and modalities as indicated, otherwise progress towards established rehab goals.    Alm DELENA Jenny PT, DPT 11/19/2023 8:05 AM

## 2023-11-20 ENCOUNTER — Encounter: Payer: Self-pay | Admitting: Physical Therapy

## 2023-11-20 ENCOUNTER — Ambulatory Visit: Payer: Self-pay | Admitting: Physical Therapy

## 2023-11-20 DIAGNOSIS — G8929 Other chronic pain: Secondary | ICD-10-CM | POA: Diagnosis not present

## 2023-11-20 DIAGNOSIS — M25511 Pain in right shoulder: Secondary | ICD-10-CM | POA: Diagnosis not present

## 2023-11-20 DIAGNOSIS — R293 Abnormal posture: Secondary | ICD-10-CM

## 2023-11-20 DIAGNOSIS — M6281 Muscle weakness (generalized): Secondary | ICD-10-CM | POA: Diagnosis not present

## 2023-11-20 DIAGNOSIS — M546 Pain in thoracic spine: Secondary | ICD-10-CM | POA: Diagnosis not present

## 2023-11-22 DIAGNOSIS — Z419 Encounter for procedure for purposes other than remedying health state, unspecified: Secondary | ICD-10-CM | POA: Diagnosis not present

## 2023-11-26 ENCOUNTER — Ambulatory Visit

## 2023-11-26 DIAGNOSIS — M25511 Pain in right shoulder: Secondary | ICD-10-CM | POA: Diagnosis not present

## 2023-11-26 DIAGNOSIS — M6281 Muscle weakness (generalized): Secondary | ICD-10-CM | POA: Diagnosis not present

## 2023-11-26 DIAGNOSIS — G8929 Other chronic pain: Secondary | ICD-10-CM | POA: Diagnosis not present

## 2023-11-26 DIAGNOSIS — M546 Pain in thoracic spine: Secondary | ICD-10-CM | POA: Diagnosis not present

## 2023-11-26 DIAGNOSIS — R293 Abnormal posture: Secondary | ICD-10-CM

## 2023-11-26 NOTE — Therapy (Signed)
 OUTPATIENT PHYSICAL THERAPY TREATMENT   Patient Name: Marissa Garcia MRN: [id number] DOB:May 08, 1966, 58 y.o., female Today's Date: 11/26/2023  END OF SESSION:  PT End of Session - 11/26/23 1444     Visit Number 4    Number of Visits 16    Date for PT Re-Evaluation 01/05/24    Authorization Type [facility]    Authorization Time Period 10 PT visits from 11/10/23-01/09/24    Authorization - Visit Number 4    Authorization - Number of Visits 10    PT Start Time 1445    PT Stop Time 1515    PT Time Calculation (min) 30 min    Activity Tolerance Patient tolerated treatment well    Behavior During Therapy [facility] for tasks assessed/performed          Past Medical History:  Diagnosis Date   Burst fracture of T12 vertebra (HCC) 10/13/2018   Complication of anesthesia    Constipation    Headache    Hypercholesteremia    Parkinson's disease (HCC)    Past Surgical History:  Procedure Laterality Date   CESAREAN SECTION     Patient Active Problem List   Diagnosis Date Noted   Lumbar pain 03/22/2022   Impingement syndrome of right shoulder 08/05/2019   Chronic neck pain 08/05/2019   Sprain of right rotator cuff capsule 07/14/2019   Body mass index (BMI) 27.0-27.9, adult 07/14/2019   Nutritional counseling 07/13/2019   Parkinson's disease (HCC) 04/22/2019   Lumbar radiculopathy 04/22/2019   Burst fracture of thoracic vertebra (HCC) 10/15/2018   Atrophic vulvovaginitis 01/25/2016   Perimenopausal 01/25/2016   Dental caries 07/05/2010   Headache 03/21/2010   Anxiety state 02/27/2010   ACID REFLUX DISEASE 02/27/2010   WRIST PAIN, RIGHT 02/27/2010   DIZZINESS 02/27/2010   CHEST PAIN 02/15/2010   HYPERCHOLESTEROLEMIA 05/13/2008    PCP: Theotis Haze ORN, NP  REFERRING PROVIDER: Delbert Clam, MD  REFERRING DIAG:  M75.41 (ICD-10-CM) - Impingement syndrome of right shoulder  M54.6,G89.29 (ICD-10-CM) - Chronic midline thoracic back pain    THERAPY DIAG:  Chronic right  shoulder pain  Muscle weakness (generalized)  Pain in thoracic spine  Abnormal posture  Rationale for Evaluation and Treatment: Rehabilitation  ONSET DATE: 10/13/2018  SUBJECTIVE:                                                                                                                                                                                     Per eval: Patient presents to PT today d/t R shoulder pain and upper back pain that has been present since her fall in 2020. She states that her symptoms started after a fall, but they  were just focused on my back at the time, so they didn't really deal with my arm. She reports that it has been getting worse of the past few months. She would like to know what type of exercises that she can do at home. She has most difficulty with raising her R arm and reaching her back.  Patient verbalizes understanding that she can utilize interpretation services, however she states that she does not need that. She does prefer to receive written information in Spanish.  Hand dominance: Right  SUBJECTIVE STATEMENT: Patient reports that her pain is much higher today in her Rt shoulder especially. She states she tried to lift something into a cabinet last night and was unable to lift it all the way and then got stuck and couldn't lower it and had to wait for help.    PERTINENT HISTORY: Relevant PMHx includes T12 burst fracture, HA, Parkinson's Disease, Chronic Neck Pain, Sprain of Right Rotator Cuff Capsule, R Wrist Pain  PAIN:  Are you having pain? 8/10 R shoulder blade, no shoulder pain  Per eval: Yes: NPRS scale: 10/10 Pain location: R shoulder and BIL TS, occasionally radiates to R side of neck Pain description: burning, R hand paresthesia. Aggravating factors: lifting arm  Relieving factors: topical cream  PRECAUTIONS: None  RED FLAGS: None   WEIGHT BEARING RESTRICTIONS: No  FALLS:  Has patient fallen in last 6 months? No  LIVING  ENVIRONMENT: Lives with: lives with their family and lives with their daughter Lives in: House/apartment Stairs: Yes: Internal: 1 flight steps; not using d/t bedroom on first floor  Has following equipment at home: None  OCCUPATION: Cleaning houses  PLOF: Independent  PATIENT GOALS:To have less pain, be able to lift her arm, wash her back and perform work activities   NEXT MD VISIT: 11/11/2023 Neurology Appt  OBJECTIVE:  Note: Objective measures were completed at Evaluation unless otherwise noted.  DIAGNOSTIC FINDINGS:  10/13/2023: R SHOULDER XRAY FINDINGS: There is no evidence of fracture or dislocation. There is no evidence of arthropathy or other focal bone abnormality. Soft tissues are unremarkable.   IMPRESSION: Negative.  10/13/2023: CHEST XRAY FINDINGS: The heart size and mediastinal contours are within normal limits. Both lungs are clear. The visualized skeletal structures are unremarkable.   IMPRESSION: No active cardiopulmonary disease.   PATIENT SURVEYS:  Quick DASH: 56.8  COGNITION: Overall cognitive status: Within functional limits for tasks assessed     SENSATION: Not tested  POSTURE: Rounded Shoulders  UPPER EXTREMITY ROM:   Active ROM Right eval Left eval R 11/18/23  Shoulder flexion A 84 (P 110) WFL A: 114 deg *  Shoulder extension     Shoulder abduction A 80 (P 85) WFL   Shoulder adduction     Shoulder internal rotation Functional reach to L3    Shoulder external rotation     Elbow flexion     Elbow extension     Wrist flexion     Wrist extension     Wrist ulnar deviation     Wrist radial deviation     Wrist pronation     Wrist supination     (Blank rows = not tested)  UPPER EXTREMITY MMT:  MMT Right eval Left eval  Shoulder flexion    Shoulder extension    Shoulder abduction    Shoulder adduction    Shoulder internal rotation    Shoulder external rotation    Middle trapezius    Lower trapezius    Elbow flexion  Elbow  extension    Wrist flexion    Wrist extension    Wrist ulnar deviation    Wrist radial deviation    Wrist pronation    Wrist supination    Grip strength (lbs)    (Blank rows = not tested)  SHOULDER SPECIAL TESTS: Impingement tests: Neer impingement test: positive  and Hawkins/Kennedy impingement test: positive  Positive result, subsequent to intolerance of testing positions d/t painful passive and active shoulder elevation AROM    Mild symptom relief following gentle GH distraction and Grade II lateral mobilization   PALPATION:  Pain with palpation throughout TS CPA; painfree throughout LS CPA Pain with palpation along medial border of BIL scapula, and BIL parascapular mm.                                                                                                                              TREATMENT DATE:  [facility] Adult PT Treatment:                                                DATE: 11/26/23 Therapeutic Exercise: Finger ladder scaption x8 max 21, cues to relax UT Supine shoulder flexion with dowel 2x10 Supine chest press with dowel 2# 2x10 Sidelying shoulder trio: abduction, flexion, ER x10 ea Rt Neuromuscular re-ed: Staggered red band chest press, unilateral, RUE red band 2x8  Green band row 2x10   [facility] Adult PT Treatment:                                                DATE: 11/20/23 Therapeutic Exercise:  Seated thoracic ext 2x8  cues for head positioning Finger ladder scaption x8 max 21, cues to relax UT Rhomboid stretch at squat rack 3x30sec  Standing swiss ball flexion at table x10 Standing swiss ball abd at table x10  HEP update + education/handout, education on relevant anatomy/physiology and rationale for interventions throughout  Neuromuscular re-ed: Staggered red band chest press, unilateral, RUE red band 2x8  Red band row x12 cues for appropriate setup Green band row 2x8     [facility] Adult PT Treatment:                                                DATE:  11/18/23 Therapeutic Exercise: Scap retraction x10 cues for pacing Shrug x10 cues for emphasis on depression Shoulder flexion slide at counter x10 Shoulder abduction slide at counter x10 cues for reduced IR compensations Red band row 2x10 cues for reduced elbow compensations Supine shoulder flexion w/ dowel 2x8 Seated  thoracic ext w/ chair x8 cues for appropriate support and neck positioning HEP discussion/education  Manual Therapy: Seated; gentle STM and trigger point release R infraspinatus, deltoid; gentle STM LS, UT, and teres    PATIENT EDUCATION: Education details: rationale for interventions, HEP  Person educated: Patient Education method: Explanation, Demonstration, Tactile cues, Verbal cues Education comprehension: verbalized understanding, returned demonstration, verbal cues required, tactile cues required, and needs further education     HOME EXERCISE PROGRAM: Access Code: [email or web address] URL: [email or web address] Date: 11/20/2023 Prepared by: Alm Jenny  Exercises - Seated Shoulder Shrugs  - 1 x daily - 7 x weekly - 2 sets - 10 reps - 3 sec hold - Seated Bilateral Shoulder Flexion Towel Slide at Table Top  - 1 x daily - 7 x weekly - 2 sets - 10 reps - 3 sec hold - Seated Shoulder Abduction Towel Slide at Table Top with Forearm in Neutral  - 1 x daily - 7 x weekly - 2 sets - 10 reps - Standing Shoulder Row with Anchored Resistance  - 1 x daily - 7 x weekly - 2-3 sets - 8 reps  ASSESSMENT:  CLINICAL IMPRESSION: Patient presents to PT reporting increased Rt shoulder pain today and that she has been compliant with her HEP. Session today focused more on Rt shoulder strengthening and ROM. She has increased pain with abduction and flexion, even in gravity reduced positions. Terminated session a few minutes early today due to increased pain. Patient continues to benefit from skilled PT services and should be progressed as able to improve functional independence.     Per eval: Livie is a 58 y.o. female who was seen today for physical therapy evaluation and treatment for persistent R shoulder and Thoracic pain. She is demonstrating decreased R shoulder AROM, inability to tolerate testing position for shoulder impingement tests, and pain with palpation along BIL TS spine. She has related pain and difficulty with overhead reaching, lifting, upper body dressing, bathing, household management and occupational duties. She does respond well to gentle GH distraction and Grade II lateral mobilization, reporting mild pain relief.  She requires skilled PT services at this time to address relevant deficits and improve overall function.     OBJECTIVE IMPAIRMENTS: decreased activity tolerance, decreased ROM, decreased strength, impaired UE functional use, postural dysfunction, and pain.   ACTIVITY LIMITATIONS: carrying, lifting, sleeping, bathing, and dressing  PARTICIPATION LIMITATIONS: meal prep, cleaning, laundry, interpersonal relationship, shopping, and occupation  PERSONAL FACTORS: Profession, Time since onset of injury/illness/exacerbation, and 3+ comorbidities: Relevant PMHx includes T12 burst fracture, HA, Parkinson's Disease, Chronic Neck Pain, Sprain of Right Rotator Cuff Capsule, R Wrist Pain are also affecting patient's functional outcome.   REHAB POTENTIAL: Fair    CLINICAL DECISION MAKING: Evolving/moderate complexity  EVALUATION COMPLEXITY: Moderate   GOALS: Goals reviewed with patient? YES  SHORT TERM GOALS: Target date: 12/08/2023   Patient will be independent with initial home program at least 3 days/week.  Baseline: provided at eval Goal Status: INITIAL   2.  Patient will demonstrate improved postural awareness for at least 15 minutes while seated without need for cueing from PT.  Baseline: see objective measures Goal Status: INITIAL   3.  Patient will demonstrate improved R Shoulder elevation AROM to at least 145 degrees.  Baseline: 84  flexion, 80 abduction  Goal status: INITIAL   LONG TERM GOALS: Target date: 01/05/2024   Patient will report improved overall functional ability with QuickDASH score of 40 or less.  Baseline: 56 Goal Status:  INITIAL   2.  Patient demonstrate improved functional IR reach to at least T12 in order to improved ability to wash her back, without exacerbation of symptoms.  Baseline: T3 Goal status: INITIAL  3.  Patient will demonstrate ability to perform overhead lifting of at least 10# using appropriate body mechanics and with no more than minimal pain in order to safely perform normal daily/occupational tasks.   Baseline: unable to lift higher than chest height  Goal Status: INITIAL    4.  Patient will report ability to perform normal occupational duties, with no more than 4/10 pain at end of most work days.   Baseline: 10/10 pain   Goal Status: INITIAL     PLAN:  PT FREQUENCY: 1-2x/week  PT DURATION: 8 weeks  PLANNED INTERVENTIONS: 97164- PT Re-evaluation, 97110-Therapeutic exercises, 97530- Therapeutic activity, 97112- Neuromuscular re-education, 97535- Self Care, [id number]- Manual therapy, [id number]- Electrical stimulation (unattended), Patient/Family education, Taping, Joint mobilization, Spinal mobilization, Cryotherapy, and Moist heat   [employer] Authorization   Choose one: Rehabilitative  Standardized Assessment or Functional Outcome Tool: See Pain Assessment and Quick DASH  Score or Percent Disability: 56.8  Body Parts Treated (Select each separately):  Shoulder. Overall deficits/functional limitations for body part selected: moderate Cervicothoracic. Overall deficits/functional limitations for body part selected: moderate    PLAN FOR NEXT SESSION: address periscapular/postural mm endurance, address TS and R shoulder mobility, manual therapy and modalities as indicated, otherwise progress towards established rehab goals.    Corean Pouch PTA  11/26/2023 3:29 PM

## 2023-11-28 ENCOUNTER — Ambulatory Visit

## 2023-12-02 ENCOUNTER — Ambulatory Visit

## 2023-12-02 DIAGNOSIS — G8929 Other chronic pain: Secondary | ICD-10-CM | POA: Diagnosis not present

## 2023-12-02 DIAGNOSIS — R293 Abnormal posture: Secondary | ICD-10-CM

## 2023-12-02 DIAGNOSIS — M6281 Muscle weakness (generalized): Secondary | ICD-10-CM

## 2023-12-02 DIAGNOSIS — M546 Pain in thoracic spine: Secondary | ICD-10-CM

## 2023-12-02 DIAGNOSIS — M25511 Pain in right shoulder: Secondary | ICD-10-CM | POA: Diagnosis not present

## 2023-12-02 NOTE — Therapy (Signed)
 OUTPATIENT PHYSICAL THERAPY TREATMENT   Patient Name: Marissa Garcia MRN: [id number] DOB:[birth year], 58 y.o., female Today's Date: 12/02/2023  END OF SESSION:  PT End of Session - 12/02/23 0831     Visit Number 6    Number of Visits 16    Date for PT Re-Evaluation 01/05/24    Authorization Type [facility] [place]    Authorization Time Period 10 PT visits from 11/10/23-01/09/24    Authorization - Visit Number 5    Authorization - Number of Visits 10    PT Start Time 0830    PT Stop Time 0908    PT Time Calculation (min) 38 min    Activity Tolerance Patient tolerated treatment well    Behavior During Therapy [facility] for tasks assessed/performed           Past Medical History:  Diagnosis Date   Burst fracture of T12 vertebra (HCC) 10/13/2018   Complication of anesthesia    Constipation    Headache    Hypercholesteremia    Parkinson's disease (HCC)    Past Surgical History:  Procedure Laterality Date   CESAREAN SECTION     Patient Active Problem List   Diagnosis Date Noted   Lumbar pain 03/22/2022   Impingement syndrome of right shoulder 08/05/2019   Chronic neck pain 08/05/2019   Sprain of right rotator cuff capsule 07/14/2019   Body mass index (BMI) 27.0-27.9, adult 07/14/2019   Nutritional counseling 07/13/2019   Parkinson's disease (HCC) 04/22/2019   Lumbar radiculopathy 04/22/2019   Burst fracture of thoracic vertebra (HCC) 10/15/2018   Atrophic vulvovaginitis 01/25/2016   Perimenopausal 01/25/2016   Dental caries 07/05/2010   Headache 03/21/2010   Anxiety state 02/27/2010   ACID REFLUX DISEASE 02/27/2010   WRIST PAIN, RIGHT 02/27/2010   DIZZINESS 02/27/2010   CHEST PAIN 02/15/2010   HYPERCHOLESTEROLEMIA 05/13/2008    PCP: Theotis Haze ORN, NP  REFERRING PROVIDER: Delbert Clam, MD  REFERRING DIAG:  M75.41 (ICD-10-CM) - Impingement syndrome of right shoulder  M54.6,G89.29 (ICD-10-CM) - Chronic midline thoracic back pain    THERAPY DIAG:  Chronic right  shoulder pain  Muscle weakness (generalized)  Pain in thoracic spine  Abnormal posture  Rationale for Evaluation and Treatment: Rehabilitation  ONSET DATE: 10/13/2018  SUBJECTIVE:                                                                                                                                                                                     Per eval: Patient presents to PT today d/t R shoulder pain and upper back pain that has been present since her fall in 2020. She states that her symptoms started after a fall, but  they were just focused on my back at the time, so they didn't really deal with my arm. She reports that it has been getting worse of the past few months. She would like to know what type of exercises that she can do at home. She has most difficulty with raising her R arm and reaching her back.  Patient verbalizes understanding that she can utilize interpretation services, however she states that she does not need that. She does prefer to receive written information in Spanish.  Hand dominance: Right  SUBJECTIVE STATEMENT: Patient reports that her pain is the same as last week and that it was worse on Sunday and Monday this week.   PERTINENT HISTORY: Relevant PMHx includes T12 burst fracture, HA, Parkinson's Disease, Chronic Neck Pain, Sprain of Right Rotator Cuff Capsule, R Wrist Pain  PAIN:  Are you having pain? 8/10 R shoulder blade, no shoulder pain  Per eval: Yes: NPRS scale: 10/10 Pain location: R shoulder and BIL TS, occasionally radiates to R side of neck Pain description: burning, R hand paresthesia. Aggravating factors: lifting arm  Relieving factors: topical cream  PRECAUTIONS: None  RED FLAGS: None   WEIGHT BEARING RESTRICTIONS: No  FALLS:  Has patient fallen in last 6 months? No  LIVING ENVIRONMENT: Lives with: lives with their family and lives with their daughter Lives in: House/apartment Stairs: Yes: Internal: 1 flight steps;  not using d/t bedroom on first floor  Has following equipment at home: None  OCCUPATION: Cleaning houses  PLOF: Independent  PATIENT GOALS:To have less pain, be able to lift her arm, wash her back and perform work activities   NEXT MD VISIT: 11/11/2023 Neurology Appt  OBJECTIVE:  Note: Objective measures were completed at Evaluation unless otherwise noted.  DIAGNOSTIC FINDINGS:  10/13/2023: R SHOULDER XRAY FINDINGS: There is no evidence of fracture or dislocation. There is no evidence of arthropathy or other focal bone abnormality. Soft tissues are unremarkable.   IMPRESSION: Negative.  10/13/2023: CHEST XRAY FINDINGS: The heart size and mediastinal contours are within normal limits. Both lungs are clear. The visualized skeletal structures are unremarkable.   IMPRESSION: No active cardiopulmonary disease.   PATIENT SURVEYS:  Quick DASH: 56.8  COGNITION: Overall cognitive status: Within functional limits for tasks assessed     SENSATION: Not tested  POSTURE: Rounded Shoulders  UPPER EXTREMITY ROM:   Active ROM Right eval Left eval R 11/18/23  Shoulder flexion A 84 (P 110) WFL A: 114 deg *  Shoulder extension     Shoulder abduction A 80 (P 85) WFL   Shoulder adduction     Shoulder internal rotation Functional reach to L3    Shoulder external rotation     Elbow flexion     Elbow extension     Wrist flexion     Wrist extension     Wrist ulnar deviation     Wrist radial deviation     Wrist pronation     Wrist supination     (Blank rows = not tested)  UPPER EXTREMITY MMT:  MMT Right eval Left eval  Shoulder flexion    Shoulder extension    Shoulder abduction    Shoulder adduction    Shoulder internal rotation    Shoulder external rotation    Middle trapezius    Lower trapezius    Elbow flexion    Elbow extension    Wrist flexion    Wrist extension    Wrist ulnar deviation    Wrist radial deviation  Wrist pronation    Wrist supination     Grip strength (lbs)    (Blank rows = not tested)  SHOULDER SPECIAL TESTS: Impingement tests: Neer impingement test: positive  and Hawkins/Kennedy impingement test: positive  Positive result, subsequent to intolerance of testing positions d/t painful passive and active shoulder elevation AROM    Mild symptom relief following gentle GH distraction and Grade II lateral mobilization   PALPATION:  Pain with palpation throughout TS CPA; painfree throughout LS CPA Pain with palpation along medial border of BIL scapula, and BIL parascapular mm.                                                                                                                              TREATMENT DATE:  [facility] Adult PT Treatment:                                                DATE: 12/02/23 Therapeutic Exercise: Supine shoulder flexion with dowel 2x10 Supine shoulder flexion AAROM with other UE 2x10 Supine shoulder flexion AROM no weight RUE 2x10 Supine chest press with dowel no weight today 2x10 (started small ROM, increased with repetitions) Neuromuscular Re-ed: Seated scapular retraction 2x10 (pt reports pain relief) Seated shoulder shrugs 2x10 Sidelying shoulder trio: abduction, flexion, ER x10 ea Rt (abduction very small ROM)   [facility] Adult PT Treatment:                                                DATE: 11/26/23 Therapeutic Exercise: Finger ladder scaption x8 max 21, cues to relax UT Supine shoulder flexion with dowel 2x10 Supine chest press with dowel 2# 2x10 Sidelying shoulder trio: abduction, flexion, ER x10 ea Rt Neuromuscular re-ed: Staggered red band chest press, unilateral, RUE red band 2x8  Green band row 2x10   [facility] Adult PT Treatment:                                                DATE: 11/20/23 Therapeutic Exercise:  Seated thoracic ext 2x8  cues for head positioning Finger ladder scaption x8 max 21, cues to relax UT Rhomboid stretch at squat rack 3x30sec  Standing swiss ball flexion at  table x10 Standing swiss ball abd at table x10  HEP update + education/handout, education on relevant anatomy/physiology and rationale for interventions throughout  Neuromuscular re-ed: Staggered red band chest press, unilateral, RUE red band 2x8  Red band row x12 cues for appropriate setup Green band row 2x8    PATIENT EDUCATION: Education details: rationale for interventions, HEP  Person educated: Patient Education method: Explanation, Demonstration, Tactile cues, Verbal cues Education comprehension: verbalized understanding, returned demonstration, verbal cues required, tactile cues required, and needs further education     HOME EXERCISE PROGRAM: Access Code: [email or web address] URL: [email or web address] Date: 11/20/2023 Prepared by: Alm Jenny  Exercises - Seated Shoulder Shrugs  - 1 x daily - 7 x weekly - 2 sets - 10 reps - 3 sec hold - Seated Bilateral Shoulder Flexion Towel Slide at Table Top  - 1 x daily - 7 x weekly - 2 sets - 10 reps - 3 sec hold - Seated Shoulder Abduction Towel Slide at Table Top with Forearm in Neutral  - 1 x daily - 7 x weekly - 2 sets - 10 reps - Standing Shoulder Row with Anchored Resistance  - 1 x daily - 7 x weekly - 2-3 sets - 8 reps  ASSESSMENT:  CLINICAL IMPRESSION: Patient presents to PT reporting continued increase her Rt shoulder pain and that her function is continuing to decrease as well. Regressed session today to accommodate increased pain and worked on AAROM. Movements did become slightly easier as session progressed. Informed evaluating therapist of increased pain and decreased overall function. Patient continues to benefit from skilled PT services and should be progressed as able to improve functional independence.   Per eval: Cartina is a 58 y.o. female who was seen today for physical therapy evaluation and treatment for persistent R shoulder and Thoracic pain. She is demonstrating decreased R shoulder AROM, inability to tolerate  testing position for shoulder impingement tests, and pain with palpation along BIL TS spine. She has related pain and difficulty with overhead reaching, lifting, upper body dressing, bathing, household management and occupational duties. She does respond well to gentle GH distraction and Grade II lateral mobilization, reporting mild pain relief.  She requires skilled PT services at this time to address relevant deficits and improve overall function.     OBJECTIVE IMPAIRMENTS: decreased activity tolerance, decreased ROM, decreased strength, impaired UE functional use, postural dysfunction, and pain.   ACTIVITY LIMITATIONS: carrying, lifting, sleeping, bathing, and dressing  PARTICIPATION LIMITATIONS: meal prep, cleaning, laundry, interpersonal relationship, shopping, and occupation  PERSONAL FACTORS: Profession, Time since onset of injury/illness/exacerbation, and 3+ comorbidities: Relevant PMHx includes T12 burst fracture, HA, Parkinson's Disease, Chronic Neck Pain, Sprain of Right Rotator Cuff Capsule, R Wrist Pain are also affecting patient's functional outcome.   REHAB POTENTIAL: Fair    CLINICAL DECISION MAKING: Evolving/moderate complexity  EVALUATION COMPLEXITY: Moderate   GOALS: Goals reviewed with patient? YES  SHORT TERM GOALS: Target date: 12/08/2023   Patient will be independent with initial home program at least 3 days/week.  Baseline: provided at eval Goal Status: MET Pt reports adherence 12/02/23   2.  Patient will demonstrate improved postural awareness for at least 15 minutes while seated without need for cueing from PT.  Baseline: see objective measures Goal Status: INITIAL   3.  Patient will demonstrate improved R Shoulder elevation AROM to at least 145 degrees.  Baseline: 84 flexion, 80 abduction  Goal status: INITIAL   LONG TERM GOALS: Target date: 01/05/2024   Patient will report improved overall functional ability with QuickDASH score of 40 or less.   Baseline: 56 Goal Status: INITIAL   2.  Patient demonstrate improved functional IR reach to at least T12 in order to improved ability to wash her back, without exacerbation of symptoms.  Baseline: T3 Goal status: INITIAL  3.  Patient will demonstrate ability to perform overhead lifting  of at least 10# using appropriate body mechanics and with no more than minimal pain in order to safely perform normal daily/occupational tasks.   Baseline: unable to lift higher than chest height  Goal Status: INITIAL    4.  Patient will report ability to perform normal occupational duties, with no more than 4/10 pain at end of most work days.   Baseline: 10/10 pain   Goal Status: INITIAL     PLAN:  PT FREQUENCY: 1-2x/week  PT DURATION: 8 weeks  PLANNED INTERVENTIONS: 97164- PT Re-evaluation, 97110-Therapeutic exercises, 97530- Therapeutic activity, 97112- Neuromuscular re-education, 97535- Self Care, [id number]- Manual therapy, [id number]- Electrical stimulation (unattended), Patient/Family education, Taping, Joint mobilization, Spinal mobilization, Cryotherapy, and Moist heat   [employer] Authorization   Choose one: Rehabilitative  Standardized Assessment or Functional Outcome Tool: See Pain Assessment and Quick DASH  Score or Percent Disability: 56.8  Body Parts Treated (Select each separately):  Shoulder. Overall deficits/functional limitations for body part selected: moderate Cervicothoracic. Overall deficits/functional limitations for body part selected: moderate    PLAN FOR NEXT SESSION: address periscapular/postural mm endurance, address TS and R shoulder mobility, manual therapy and modalities as indicated, otherwise progress towards established rehab goals.    Corean Pouch PTA  12/02/2023 9:06 AM

## 2023-12-04 ENCOUNTER — Ambulatory Visit

## 2023-12-04 DIAGNOSIS — M6281 Muscle weakness (generalized): Secondary | ICD-10-CM

## 2023-12-04 DIAGNOSIS — G8929 Other chronic pain: Secondary | ICD-10-CM | POA: Diagnosis not present

## 2023-12-04 DIAGNOSIS — R293 Abnormal posture: Secondary | ICD-10-CM

## 2023-12-04 DIAGNOSIS — M25511 Pain in right shoulder: Secondary | ICD-10-CM | POA: Diagnosis not present

## 2023-12-04 DIAGNOSIS — M546 Pain in thoracic spine: Secondary | ICD-10-CM | POA: Diagnosis not present

## 2023-12-04 NOTE — Therapy (Signed)
 OUTPATIENT PHYSICAL THERAPY TREATMENT   Patient Name: Marissa Garcia MRN: [id number] DOB:[birth year], 58 y.o., female Today's Date: 12/04/2023  END OF SESSION:  PT End of Session - 12/04/23 0828     Visit Number 7    Number of Visits 16    Date for PT Re-Evaluation 01/05/24    Authorization Type [facility]    Authorization Time Period 10 PT visits from 11/10/23-01/09/24    Authorization - Visit Number 6    Authorization - Number of Visits 10    PT Start Time 0830    PT Stop Time 0908    PT Time Calculation (min) 38 min    Activity Tolerance Patient tolerated treatment well    Behavior During Therapy [facility] for tasks assessed/performed            Past Medical History:  Diagnosis Date   Burst fracture of T12 vertebra (HCC) 10/13/2018   Complication of anesthesia    Constipation    Headache    Hypercholesteremia    Parkinson's disease (HCC)    Past Surgical History:  Procedure Laterality Date   CESAREAN SECTION     Patient Active Problem List   Diagnosis Date Noted   Lumbar pain 03/22/2022   Impingement syndrome of right shoulder 08/05/2019   Chronic neck pain 08/05/2019   Sprain of right rotator cuff capsule 07/14/2019   Body mass index (BMI) 27.0-27.9, adult 07/14/2019   Nutritional counseling 07/13/2019   Parkinson's disease (HCC) 04/22/2019   Lumbar radiculopathy 04/22/2019   Burst fracture of thoracic vertebra (HCC) 10/15/2018   Atrophic vulvovaginitis 01/25/2016   Perimenopausal 01/25/2016   Dental caries 07/05/2010   Headache 03/21/2010   Anxiety state 02/27/2010   ACID REFLUX DISEASE 02/27/2010   WRIST PAIN, RIGHT 02/27/2010   DIZZINESS 02/27/2010   CHEST PAIN 02/15/2010   HYPERCHOLESTEROLEMIA 05/13/2008    PCP: Theotis Haze ORN, NP  REFERRING PROVIDER: Delbert Clam, MD  REFERRING DIAG:  M75.41 (ICD-10-CM) - Impingement syndrome of right shoulder  M54.6,G89.29 (ICD-10-CM) - Chronic midline thoracic back pain    THERAPY DIAG:  Chronic right  shoulder pain  Muscle weakness (generalized)  Pain in thoracic spine  Abnormal posture  Rationale for Evaluation and Treatment: Rehabilitation  ONSET DATE: 10/13/2018  SUBJECTIVE:                                                                                                                                                                                     Per eval: Patient presents to PT today d/t R shoulder pain and upper back pain that has been present since her fall in 2020. She states that her symptoms started after a fall,  but they were just focused on my back at the time, so they didn't really deal with my arm. She reports that it has been getting worse of the past few months. She would like to know what type of exercises that she can do at home. She has most difficulty with raising her R arm and reaching her back.  Patient verbalizes understanding that she can utilize interpretation services, however she states that she does not need that. She does prefer to receive written information in Spanish.  Hand dominance: Right  SUBJECTIVE STATEMENT: Patient reports that she thinks she was just having a bad day last time she was here. Her pain is better today, medium.   PERTINENT HISTORY: Relevant PMHx includes T12 burst fracture, HA, Parkinson's Disease, Chronic Neck Pain, Sprain of Right Rotator Cuff Capsule, R Wrist Pain  PAIN:  Are you having pain? 8/10 R shoulder blade, no shoulder pain  Per eval: Yes: NPRS scale: 10/10 Pain location: R shoulder and BIL TS, occasionally radiates to R side of neck Pain description: burning, R hand paresthesia. Aggravating factors: lifting arm  Relieving factors: topical cream  PRECAUTIONS: None  RED FLAGS: None   WEIGHT BEARING RESTRICTIONS: No  FALLS:  Has patient fallen in last 6 months? No  LIVING ENVIRONMENT: Lives with: lives with their family and lives with their daughter Lives in: House/apartment Stairs: Yes: Internal: 1  flight steps; not using d/t bedroom on first floor  Has following equipment at home: None  OCCUPATION: Cleaning houses  PLOF: Independent  PATIENT GOALS:To have less pain, be able to lift her arm, wash her back and perform work activities   NEXT MD VISIT: 11/11/2023 Neurology Appt  OBJECTIVE:  Note: Objective measures were completed at Evaluation unless otherwise noted.  DIAGNOSTIC FINDINGS:  10/13/2023: R SHOULDER XRAY FINDINGS: There is no evidence of fracture or dislocation. There is no evidence of arthropathy or other focal bone abnormality. Soft tissues are unremarkable.   IMPRESSION: Negative.  10/13/2023: CHEST XRAY FINDINGS: The heart size and mediastinal contours are within normal limits. Both lungs are clear. The visualized skeletal structures are unremarkable.   IMPRESSION: No active cardiopulmonary disease.   PATIENT SURVEYS:  Quick DASH: 56.8  COGNITION: Overall cognitive status: Within functional limits for tasks assessed     SENSATION: Not tested  POSTURE: Rounded Shoulders  UPPER EXTREMITY ROM:   Active ROM Right eval Left eval R 11/18/23  Shoulder flexion A 84 (P 110) WFL A: 114 deg *  Shoulder extension     Shoulder abduction A 80 (P 85) WFL   Shoulder adduction     Shoulder internal rotation Functional reach to L3    Shoulder external rotation     Elbow flexion     Elbow extension     Wrist flexion     Wrist extension     Wrist ulnar deviation     Wrist radial deviation     Wrist pronation     Wrist supination     (Blank rows = not tested)  UPPER EXTREMITY MMT:  MMT Right eval Left eval  Shoulder flexion    Shoulder extension    Shoulder abduction    Shoulder adduction    Shoulder internal rotation    Shoulder external rotation    Middle trapezius    Lower trapezius    Elbow flexion    Elbow extension    Wrist flexion    Wrist extension    Wrist ulnar deviation    Wrist  radial deviation    Wrist pronation    Wrist  supination    Grip strength (lbs)    (Blank rows = not tested)  SHOULDER SPECIAL TESTS: Impingement tests: Neer impingement test: positive  and Hawkins/Kennedy impingement test: positive  Positive result, subsequent to intolerance of testing positions d/t painful passive and active shoulder elevation AROM    Mild symptom relief following gentle GH distraction and Grade II lateral mobilization   PALPATION:  Pain with palpation throughout TS CPA; painfree throughout LS CPA Pain with palpation along medial border of BIL scapula, and BIL parascapular mm.                                                                                                                              TREATMENT DATE:   [facility] Adult PT Treatment:                                                DATE: 12/04/2023  Therapeutic Exercise: Seated Pulley x 2 min flexion  Standing finger ladder x 10 flexion, x 5 abduction  Supine shoulder flexion with dowel 2x10 Supine chest press with dowel 2lb 2x10  Neuromuscular Re-ed: Seated scapular retraction 2x10 (pt reports pain relief) Seated shoulder shrugs 2x10 Sidelying shoulder trio: abduction, ER x 10 each -- stopped today shoulder flexion d/t pain, revisit at next session  [facility] Adult PT Treatment:                                                DATE: 12/02/23 Therapeutic Exercise: Supine shoulder flexion with dowel 2x10 Supine shoulder flexion AAROM with other UE 2x10 Supine shoulder flexion AROM no weight RUE 2x10 Supine chest press with dowel no weight today 2x10 (started small ROM, increased with repetitions) Neuromuscular Re-ed: Seated scapular retraction 2x10 (pt reports pain relief) Seated shoulder shrugs 2x10 Sidelying shoulder trio: abduction, flexion, ER x10 ea Rt (abduction very small ROM)   [facility] Adult PT Treatment:                                                DATE: 11/26/23 Therapeutic Exercise: Finger ladder scaption x8 max 21, cues to relax UT Supine  shoulder flexion with dowel 2x10 Supine chest press with dowel 2# 2x10 Sidelying shoulder trio: abduction, flexion, ER x10 ea Rt Neuromuscular re-ed: Staggered red band chest press, unilateral, RUE red band 2x8  Green band row 2x10    PATIENT EDUCATION: Education details: rationale for interventions, HEP  Person educated: Patient Education method: Explanation, Demonstration, Tactile cues, Verbal  cues Education comprehension: verbalized understanding, returned demonstration, verbal cues required, tactile cues required, and needs further education     HOME EXERCISE PROGRAM: Access Code: [email or web address] URL: [email or web address] Date: 11/20/2023 Prepared by: Alm Jenny  Exercises - Seated Shoulder Shrugs  - 1 x daily - 7 x weekly - 2 sets - 10 reps - 3 sec hold - Seated Bilateral Shoulder Flexion Towel Slide at Table Top  - 1 x daily - 7 x weekly - 2 sets - 10 reps - 3 sec hold - Seated Shoulder Abduction Towel Slide at Table Top with Forearm in Neutral  - 1 x daily - 7 x weekly - 2 sets - 10 reps - Standing Shoulder Row with Anchored Resistance  - 1 x daily - 7 x weekly - 2-3 sets - 8 reps  ASSESSMENT:  CLINICAL IMPRESSION: 12/04/2023 Ellah had improved tolerance of today's treatment session, which focused on shoulder elevation ROM and strengthening as tolerated. Patient had most pain and difficulty with shoulder abduction and extension AROM. We will continue to progress per POC as tolerated, in order to reach established rehab goals.    Per eval: Matalyn is a 58 y.o. female who was seen today for physical therapy evaluation and treatment for persistent R shoulder and Thoracic pain. She is demonstrating decreased R shoulder AROM, inability to tolerate testing position for shoulder impingement tests, and pain with palpation along BIL TS spine. She has related pain and difficulty with overhead reaching, lifting, upper body dressing, bathing, household management and occupational  duties. She does respond well to gentle GH distraction and Grade II lateral mobilization, reporting mild pain relief.  She requires skilled PT services at this time to address relevant deficits and improve overall function.     OBJECTIVE IMPAIRMENTS: decreased activity tolerance, decreased ROM, decreased strength, impaired UE functional use, postural dysfunction, and pain.   ACTIVITY LIMITATIONS: carrying, lifting, sleeping, bathing, and dressing  PARTICIPATION LIMITATIONS: meal prep, cleaning, laundry, interpersonal relationship, shopping, and occupation  PERSONAL FACTORS: Profession, Time since onset of injury/illness/exacerbation, and 3+ comorbidities: Relevant PMHx includes T12 burst fracture, HA, Parkinson's Disease, Chronic Neck Pain, Sprain of Right Rotator Cuff Capsule, R Wrist Pain are also affecting patient's functional outcome.   REHAB POTENTIAL: Fair    CLINICAL DECISION MAKING: Evolving/moderate complexity  EVALUATION COMPLEXITY: Moderate   GOALS: Goals reviewed with patient? YES  SHORT TERM GOALS: Target date: 12/08/2023   Patient will be independent with initial home program at least 3 days/week.  Baseline: provided at eval Goal Status: MET Pt reports adherence 12/02/23   2.  Patient will demonstrate improved postural awareness for at least 15 minutes while seated without need for cueing from PT.  Baseline: see objective measures Goal Status: INITIAL   3.  Patient will demonstrate improved R Shoulder elevation AROM to at least 145 degrees.  Baseline: 84 flexion, 80 abduction  Goal status: INITIAL   LONG TERM GOALS: Target date: 01/05/2024   Patient will report improved overall functional ability with QuickDASH score of 40 or less.  Baseline: 56 Goal Status: INITIAL   2.  Patient demonstrate improved functional IR reach to at least T12 in order to improved ability to wash her back, without exacerbation of symptoms.  Baseline: T3 Goal status: INITIAL  3.   Patient will demonstrate ability to perform overhead lifting of at least 10# using appropriate body mechanics and with no more than minimal pain in order to safely perform normal daily/occupational tasks.   Baseline: unable to lift higher  than chest height  Goal Status: INITIAL    4.  Patient will report ability to perform normal occupational duties, with no more than 4/10 pain at end of most work days.   Baseline: 10/10 pain   Goal Status: INITIAL     PLAN:  PT FREQUENCY: 1-2x/week  PT DURATION: 8 weeks  PLANNED INTERVENTIONS: 97164- PT Re-evaluation, 97110-Therapeutic exercises, 97530- Therapeutic activity, 97112- Neuromuscular re-education, 97535- Self Care, [id number]- Manual therapy, [id number]- Electrical stimulation (unattended), Patient/Family education, Taping, Joint mobilization, Spinal mobilization, Cryotherapy, and Moist heat   [employer] Authorization   Choose one: Rehabilitative  Standardized Assessment or Functional Outcome Tool: See Pain Assessment and Quick DASH  Score or Percent Disability: 56.8  Body Parts Treated (Select each separately):  Shoulder. Overall deficits/functional limitations for body part selected: moderate Cervicothoracic. Overall deficits/functional limitations for body part selected: moderate    PLAN FOR NEXT SESSION: address periscapular/postural mm endurance, address TS and R shoulder mobility, manual therapy and modalities as indicated, otherwise progress towards established rehab goals.    Marko Molt, PT, DPT  12/04/2023 9:11 AM

## 2023-12-09 ENCOUNTER — Ambulatory Visit: Payer: Self-pay

## 2023-12-09 DIAGNOSIS — M546 Pain in thoracic spine: Secondary | ICD-10-CM | POA: Diagnosis not present

## 2023-12-09 DIAGNOSIS — G8929 Other chronic pain: Secondary | ICD-10-CM | POA: Diagnosis not present

## 2023-12-09 DIAGNOSIS — R293 Abnormal posture: Secondary | ICD-10-CM | POA: Diagnosis not present

## 2023-12-09 DIAGNOSIS — M6281 Muscle weakness (generalized): Secondary | ICD-10-CM | POA: Diagnosis not present

## 2023-12-09 DIAGNOSIS — M25511 Pain in right shoulder: Secondary | ICD-10-CM | POA: Diagnosis not present

## 2023-12-09 NOTE — Therapy (Signed)
 OUTPATIENT PHYSICAL THERAPY TREATMENT   Patient Name: Marissa Garcia MRN: [id number] DOB:May 17, 1965, 58 y.o., female Today's Date: 12/09/2023  END OF SESSION:      Past Medical History:  Diagnosis Date   Burst fracture of T12 vertebra (HCC) 10/13/2018   Complication of anesthesia    Constipation    Headache    Hypercholesteremia    Parkinson's disease [facility])    Past Surgical History:  Procedure Laterality Date   CESAREAN SECTION     Patient Active Problem List   Diagnosis Date Noted   Lumbar pain 03/22/2022   Impingement syndrome of right shoulder 08/05/2019   Chronic neck pain 08/05/2019   Sprain of right rotator cuff capsule 07/14/2019   Body mass index (BMI) 27.0-27.9, adult 07/14/2019   Nutritional counseling 07/13/2019   Parkinson's disease (HCC) 04/22/2019   Lumbar radiculopathy 04/22/2019   Burst fracture of thoracic vertebra (HCC) 10/15/2018   Atrophic vulvovaginitis 01/25/2016   Perimenopausal 01/25/2016   Dental caries 07/05/2010   Headache 03/21/2010   Anxiety state 02/27/2010   ACID REFLUX DISEASE 02/27/2010   WRIST PAIN, RIGHT 02/27/2010   DIZZINESS 02/27/2010   CHEST PAIN 02/15/2010   HYPERCHOLESTEROLEMIA 05/13/2008    PCP: Theotis Haze ORN, NP  REFERRING PROVIDER: Delbert Clam, MD  REFERRING DIAG:  M75.41 (ICD-10-CM) - Impingement syndrome of right shoulder  M54.6,G89.29 (ICD-10-CM) - Chronic midline thoracic back pain    THERAPY DIAG:  No diagnosis found.  Rationale for Evaluation and Treatment: Rehabilitation  ONSET DATE: 10/13/2018  SUBJECTIVE:                                                                                                                                                                                     Per eval: Patient presents to PT today d/t R shoulder pain and upper back pain that has been present since her fall in 2020. She states that her symptoms started after a fall, but they were just focused on my back at  the time, so they didn't really deal with my arm. She reports that it has been getting worse of the past few months. She would like to know what type of exercises that she can do at home. She has most difficulty with raising her R arm and reaching her back.  Patient verbalizes understanding that she can utilize interpretation services, however she states that she does not need that. She does prefer to receive written information in Spanish.  Hand dominance: Right  SUBJECTIVE STATEMENT: Patient reports that she thinks she was just having a bad day last time she was here. Her pain is better today, medium.   PERTINENT HISTORY: Relevant PMHx includes T12  burst fracture, HA, Parkinson's Disease, Chronic Neck Pain, Sprain of Right Rotator Cuff Capsule, R Wrist Pain  PAIN:  Are you having pain? 8/10 R shoulder blade, no shoulder pain  Per eval: Yes: NPRS scale: 10/10 Pain location: R shoulder and BIL TS, occasionally radiates to R side of neck Pain description: burning, R hand paresthesia. Aggravating factors: lifting arm  Relieving factors: topical cream  PRECAUTIONS: None  RED FLAGS: None   WEIGHT BEARING RESTRICTIONS: No  FALLS:  Has patient fallen in last 6 months? No  LIVING ENVIRONMENT: Lives with: lives with their family and lives with their daughter Lives in: House/apartment Stairs: Yes: Internal: 1 flight steps; not using d/t bedroom on first floor  Has following equipment at home: None  OCCUPATION: Cleaning houses  PLOF: Independent  PATIENT GOALS:To have less pain, be able to lift her arm, wash her back and perform work activities   NEXT MD VISIT: 11/11/2023 Neurology Appt  OBJECTIVE:  Note: Objective measures were completed at Evaluation unless otherwise noted.  DIAGNOSTIC FINDINGS:  10/13/2023: R SHOULDER XRAY FINDINGS: There is no evidence of fracture or dislocation. There is no evidence of arthropathy or other focal bone abnormality. Soft tissues are  unremarkable.   IMPRESSION: Negative.  10/13/2023: CHEST XRAY FINDINGS: The heart size and mediastinal contours are within normal limits. Both lungs are clear. The visualized skeletal structures are unremarkable.   IMPRESSION: No active cardiopulmonary disease.   PATIENT SURVEYS:  Quick DASH: 56.8  COGNITION: Overall cognitive status: Within functional limits for tasks assessed     SENSATION: Not tested  POSTURE: Rounded Shoulders  UPPER EXTREMITY ROM:   Active ROM Right eval Left eval R 11/18/23  Shoulder flexion A 84 (P 110) WFL A: 114 deg *  Shoulder extension     Shoulder abduction A 80 (P 85) WFL   Shoulder adduction     Shoulder internal rotation Functional reach to L3    Shoulder external rotation     Elbow flexion     Elbow extension     Wrist flexion     Wrist extension     Wrist ulnar deviation     Wrist radial deviation     Wrist pronation     Wrist supination     (Blank rows = not tested)  UPPER EXTREMITY MMT:  MMT Right eval Left eval  Shoulder flexion    Shoulder extension    Shoulder abduction    Shoulder adduction    Shoulder internal rotation    Shoulder external rotation    Middle trapezius    Lower trapezius    Elbow flexion    Elbow extension    Wrist flexion    Wrist extension    Wrist ulnar deviation    Wrist radial deviation    Wrist pronation    Wrist supination    Grip strength (lbs)    (Blank rows = not tested)  SHOULDER SPECIAL TESTS: Impingement tests: Neer impingement test: positive  and Hawkins/Kennedy impingement test: positive  Positive result, subsequent to intolerance of testing positions d/t painful passive and active shoulder elevation AROM    Mild symptom relief following gentle GH distraction and Grade II lateral mobilization   PALPATION:  Pain with palpation throughout TS CPA; painfree throughout LS CPA Pain with palpation along medial border of BIL scapula, and BIL parascapular mm.  TREATMENT DATE:   [facility] Adult PT Treatment:                                                DATE: 12/09/2023  Therapeutic Exercise: Seated Pulley x 2 min flexion  Standing finger ladder x 10 flexion, x 5 abduction  Supine shoulder flexion with dowel 2x10 Supine chest press with dowel 2lb 2x10  Neuromuscular Re-ed: Seated scapular retraction 2x10 (pt reports pain relief) Seated shoulder shrugs 2x10 Sidelying shoulder trio: abduction, ER x 10 each -- stopped today shoulder flexion d/t pain, revisit at next session  [facility] Adult PT Treatment:                                                DATE: 12/04/2023  Therapeutic Exercise: Seated Pulley x 2 min flexion  Standing finger ladder x 10 flexion, x 5 abduction  Supine shoulder flexion with dowel 2x10 Supine chest press with dowel 2lb 2x10  Neuromuscular Re-ed: Seated scapular retraction 2x10 (pt reports pain relief) Seated shoulder shrugs 2x10 Sidelying shoulder trio: abduction, ER x 10 each -- stopped today shoulder flexion d/t pain, revisit at next session  [facility] Adult PT Treatment:                                                DATE: 12/02/23 Therapeutic Exercise: Supine shoulder flexion with dowel 2x10 Supine shoulder flexion AAROM with other UE 2x10 Supine shoulder flexion AROM no weight RUE 2x10 Supine chest press with dowel no weight today 2x10 (started small ROM, increased with repetitions) Neuromuscular Re-ed: Seated scapular retraction 2x10 (pt reports pain relief) Seated shoulder shrugs 2x10 Sidelying shoulder trio: abduction, flexion, ER x10 ea Rt (abduction very small ROM)   [facility] Adult PT Treatment:                                                DATE: 11/26/23 Therapeutic Exercise: Finger ladder scaption x8 max 21, cues to relax UT Supine shoulder flexion with dowel 2x10 Supine chest press with dowel 2# 2x10 Sidelying  shoulder trio: abduction, flexion, ER x10 ea Rt Neuromuscular re-ed: Staggered red band chest press, unilateral, RUE red band 2x8  Green band row 2x10    PATIENT EDUCATION: Education details: rationale for interventions, HEP  Person educated: Patient Education method: Programmer, multimedia, Demonstration, Tactile cues, Verbal cues Education comprehension: verbalized understanding, returned demonstration, verbal cues required, tactile cues required, and needs further education     HOME EXERCISE PROGRAM: Access Code: [email or web address] URL: [email or web address] Date: 11/20/2023 Prepared by: Alm Jenny  Exercises - Seated Shoulder Shrugs  - 1 x daily - 7 x weekly - 2 sets - 10 reps - 3 sec hold - Seated Bilateral Shoulder Flexion Towel Slide at Table Top  - 1 x daily - 7 x weekly - 2 sets - 10 reps - 3 sec hold - Seated Shoulder Abduction Towel Slide at Table Top with Forearm in Neutral  - 1 x  daily - 7 x weekly - 2 sets - 10 reps - Standing Shoulder Row with Anchored Resistance  - 1 x daily - 7 x weekly - 2-3 sets - 8 reps  ASSESSMENT:  CLINICAL IMPRESSION: 12/09/2023 Marissa Garcia had improved tolerance of today's treatment session, which focused on shoulder elevation ROM and strengthening as tolerated. Patient had most pain and difficulty with shoulder abduction and extension AROM. We will continue to progress per POC as tolerated, in order to reach established rehab goals.    Per eval: Marissa Garcia is a 58 y.o. female who was seen today for physical therapy evaluation and treatment for persistent R shoulder and Thoracic pain. She is demonstrating decreased R shoulder AROM, inability to tolerate testing position for shoulder impingement tests, and pain with palpation along BIL TS spine. She has related pain and difficulty with overhead reaching, lifting, upper body dressing, bathing, household management and occupational duties. She does respond well to gentle GH distraction and Grade II lateral  mobilization, reporting mild pain relief.  She requires skilled PT services at this time to address relevant deficits and improve overall function.     OBJECTIVE IMPAIRMENTS: decreased activity tolerance, decreased ROM, decreased strength, impaired UE functional use, postural dysfunction, and pain.   ACTIVITY LIMITATIONS: carrying, lifting, sleeping, bathing, and dressing  PARTICIPATION LIMITATIONS: meal prep, cleaning, laundry, interpersonal relationship, shopping, and occupation  PERSONAL FACTORS: Profession, Time since onset of injury/illness/exacerbation, and 3+ comorbidities: Relevant PMHx includes T12 burst fracture, HA, Parkinson's Disease, Chronic Neck Pain, Sprain of Right Rotator Cuff Capsule, R Wrist Pain are also affecting patient's functional outcome.   REHAB POTENTIAL: Fair    CLINICAL DECISION MAKING: Evolving/moderate complexity  EVALUATION COMPLEXITY: Moderate   GOALS: Goals reviewed with patient? YES  SHORT TERM GOALS: Target date: 12/08/2023   Patient will be independent with initial home program at least 3 days/week.  Baseline: provided at eval Goal Status: MET Pt reports adherence 12/02/23   2.  Patient will demonstrate improved postural awareness for at least 15 minutes while seated without need for cueing from PT.  Baseline: see objective measures Goal Status: INITIAL   3.  Patient will demonstrate improved R Shoulder elevation AROM to at least 145 degrees.  Baseline: 84 flexion, 80 abduction  Goal status: INITIAL   LONG TERM GOALS: Target date: 01/05/2024   Patient will report improved overall functional ability with QuickDASH score of 40 or less.  Baseline: 56 Goal Status: INITIAL   2.  Patient demonstrate improved functional IR reach to at least T12 in order to improved ability to wash her back, without exacerbation of symptoms.  Baseline: T3 Goal status: INITIAL  3.  Patient will demonstrate ability to perform overhead lifting of at least 10#  using appropriate body mechanics and with no more than minimal pain in order to safely perform normal daily/occupational tasks.   Baseline: unable to lift higher than chest height  Goal Status: INITIAL    4.  Patient will report ability to perform normal occupational duties, with no more than 4/10 pain at end of most work days.   Baseline: 10/10 pain   Goal Status: INITIAL     PLAN:  PT FREQUENCY: 1-2x/week  PT DURATION: 8 weeks  PLANNED INTERVENTIONS: 97164- PT Re-evaluation, 97110-Therapeutic exercises, 97530- Therapeutic activity, 97112- Neuromuscular re-education, 97535- Self Care, [id number]- Manual therapy, [id number]- Electrical stimulation (unattended), Patient/Family education, Taping, Joint mobilization, Spinal mobilization, Cryotherapy, and Moist heat   [employer] Authorization   Choose one: Rehabilitative  Standardized Assessment or Functional  Outcome Tool: See Pain Assessment and Quick DASH  Score or Percent Disability: 56.8  Body Parts Treated (Select each separately):  Shoulder. Overall deficits/functional limitations for body part selected: moderate Cervicothoracic. Overall deficits/functional limitations for body part selected: moderate    PLAN FOR NEXT SESSION: address periscapular/postural mm endurance, address TS and R shoulder mobility, manual therapy and modalities as indicated, otherwise progress towards established rehab goals.    Marko Molt, PT, DPT  12/09/2023 5:43 PM

## 2023-12-11 ENCOUNTER — Ambulatory Visit: Payer: Self-pay

## 2023-12-11 ENCOUNTER — Encounter: Payer: Self-pay | Admitting: Neurology

## 2023-12-11 DIAGNOSIS — G8929 Other chronic pain: Secondary | ICD-10-CM | POA: Diagnosis not present

## 2023-12-11 DIAGNOSIS — M6281 Muscle weakness (generalized): Secondary | ICD-10-CM

## 2023-12-11 DIAGNOSIS — M25511 Pain in right shoulder: Secondary | ICD-10-CM | POA: Diagnosis not present

## 2023-12-11 DIAGNOSIS — R293 Abnormal posture: Secondary | ICD-10-CM

## 2023-12-11 DIAGNOSIS — M546 Pain in thoracic spine: Secondary | ICD-10-CM

## 2023-12-11 NOTE — Therapy (Signed)
 OUTPATIENT PHYSICAL THERAPY TREATMENT   Patient Name: Marissa Garcia MRN: [id number] DOB:1965-07-10, 58 y.o., female Today's Date: 12/12/2023  END OF SESSION:  PT End of Session - 12/11/23 1744     Visit Number 9    Number of Visits 16    Date for PT Re-Evaluation 01/05/24    Authorization Type [facility]    Authorization Time Period 10 PT visits from 11/10/23-01/09/24    Authorization - Visit Number 8    Authorization - Number of Visits 10    PT Start Time 1745    PT Stop Time 1825    PT Time Calculation (min) 40 min    Activity Tolerance Patient tolerated treatment well    Behavior During Therapy [facility] for tasks assessed/performed          Past Medical History:  Diagnosis Date   Burst fracture of T12 vertebra (HCC) 10/13/2018   Complication of anesthesia    Constipation    Headache    Hypercholesteremia    Parkinson's disease (HCC)    Past Surgical History:  Procedure Laterality Date   CESAREAN SECTION     Patient Active Problem List   Diagnosis Date Noted   Lumbar pain 03/22/2022   Impingement syndrome of right shoulder 08/05/2019   Chronic neck pain 08/05/2019   Sprain of right rotator cuff capsule 07/14/2019   Body mass index (BMI) 27.0-27.9, adult 07/14/2019   Nutritional counseling 07/13/2019   Parkinson's disease (HCC) 04/22/2019   Lumbar radiculopathy 04/22/2019   Burst fracture of thoracic vertebra (HCC) 10/15/2018   Atrophic vulvovaginitis 01/25/2016   Perimenopausal 01/25/2016   Dental caries 07/05/2010   Headache 03/21/2010   Anxiety state 02/27/2010   ACID REFLUX DISEASE 02/27/2010   WRIST PAIN, RIGHT 02/27/2010   DIZZINESS 02/27/2010   CHEST PAIN 02/15/2010   HYPERCHOLESTEROLEMIA 05/13/2008    PCP: Theotis Haze ORN, NP  REFERRING PROVIDER: Delbert Clam, MD  REFERRING DIAG:  M75.41 (ICD-10-CM) - Impingement syndrome of right shoulder  M54.6,G89.29 (ICD-10-CM) - Chronic midline thoracic back pain    THERAPY DIAG:  Chronic right  shoulder pain  Muscle weakness (generalized)  Pain in thoracic spine  Abnormal posture   Rationale for Evaluation and Treatment: Rehabilitation  ONSET DATE: 10/13/2018  SUBJECTIVE:                                                                                                                                                                                     Per eval: Patient presents to PT today d/t R shoulder pain and upper back pain that has been present since her fall in 2020. She states that her symptoms started after a fall, but  they were just focused on my back at the time, so they didn't really deal with my arm. She reports that it has been getting worse of the past few months. She would like to know what type of exercises that she can do at home. She has most difficulty with raising her R arm and reaching her back.  Patient verbalizes understanding that she can utilize interpretation services, however she states that she does not need that. She does prefer to receive written information in Spanish.  Hand dominance: Right  SUBJECTIVE STATEMENT: Patient reports that the last session was helpful for her pain from the [facility].   PERTINENT HISTORY: Relevant PMHx includes T12 burst fracture, HA, Parkinson's Disease, Chronic Neck Pain, Sprain of Right Rotator Cuff Capsule, R Wrist Pain  PAIN:  Are you having pain? 8/10 R shoulder blade, no shoulder pain  Per eval: Yes: NPRS scale: 10/10 Pain location: R shoulder and BIL TS, occasionally radiates to R side of neck Pain description: burning, R hand paresthesia. Aggravating factors: lifting arm  Relieving factors: topical cream  PRECAUTIONS: None  RED FLAGS: None   WEIGHT BEARING RESTRICTIONS: No  FALLS:  Has patient fallen in last 6 months? No  LIVING ENVIRONMENT: Lives with: lives with their family and lives with their daughter Lives in: House/apartment Stairs: Yes: Internal: 1 flight steps; not using d/t bedroom on first  floor  Has following equipment at home: None  OCCUPATION: Cleaning houses  PLOF: Independent  PATIENT GOALS:To have less pain, be able to lift her arm, wash her back and perform work activities   NEXT MD VISIT: 11/11/2023 Neurology Appt  OBJECTIVE:  Note: Objective measures were completed at Evaluation unless otherwise noted.  DIAGNOSTIC FINDINGS:  10/13/2023: R SHOULDER XRAY FINDINGS: There is no evidence of fracture or dislocation. There is no evidence of arthropathy or other focal bone abnormality. Soft tissues are unremarkable.   IMPRESSION: Negative.  10/13/2023: CHEST XRAY FINDINGS: The heart size and mediastinal contours are within normal limits. Both lungs are clear. The visualized skeletal structures are unremarkable.   IMPRESSION: No active cardiopulmonary disease.   PATIENT SURVEYS:  Quick DASH: 56.8  COGNITION: Overall cognitive status: Within functional limits for tasks assessed     SENSATION: Not tested  POSTURE: Rounded Shoulders  UPPER EXTREMITY ROM:   Active ROM Right eval Left eval R 11/18/23  Shoulder flexion A 84 (P 110) WFL A: 114 deg *  Shoulder extension     Shoulder abduction A 80 (P 85) WFL   Shoulder adduction     Shoulder internal rotation Functional reach to L3    Shoulder external rotation     Elbow flexion     Elbow extension     Wrist flexion     Wrist extension     Wrist ulnar deviation     Wrist radial deviation     Wrist pronation     Wrist supination     (Blank rows = not tested)  UPPER EXTREMITY MMT:  MMT Right eval Left eval  Shoulder flexion    Shoulder extension    Shoulder abduction    Shoulder adduction    Shoulder internal rotation    Shoulder external rotation    Middle trapezius    Lower trapezius    Elbow flexion    Elbow extension    Wrist flexion    Wrist extension    Wrist ulnar deviation    Wrist radial deviation    Wrist pronation  Wrist supination    Grip strength (lbs)    (Blank  rows = not tested)  SHOULDER SPECIAL TESTS: Impingement tests: Neer impingement test: positive  and Hawkins/Kennedy impingement test: positive  Positive result, subsequent to intolerance of testing positions d/t painful passive and active shoulder elevation AROM    Mild symptom relief following gentle GH distraction and Grade II lateral mobilization   PALPATION:  Pain with palpation throughout TS CPA; painfree throughout LS CPA Pain with palpation along medial border of BIL scapula, and BIL parascapular mm.                                                                                                                              TREATMENT DATE:  [facility] Adult PT Treatment:                                                DATE: 12/11/23 Therapeutic Exercise: Seated Pulley x 3 min flexion  Supine shoulder flexion with dowel 2# x8 Supine chest press with dowel x8 Manual Therapy: STM and myofascial trigger point release for L UT, scalene, levator scap Positional release Rt upper trap Neuromuscular re-ed: Supine L shoulder isometric from 45 degrees of scaption  Seated scapular retraction 2x10 (pt reports pain relief) Standing shoulder rows RTB 2 x 10  Standing shoulder extension RTB 2 x 10    [facility] Adult PT Treatment:                                                DATE: 12/09/2023  Manual Therapy:  STM and myofascial trigger point release for L UT, scalene, levator scap  Neuromuscular Re-ed: Supine L shoulder isometric from 45 degrees of scaption  Seated scapular retraction 2x10 (pt reports pain relief) Standing shoulder rows RTB 2 x 10  Standing shoulder extension RTB 2 x 10   [facility] Adult PT Treatment:                                                DATE: 12/04/2023  Therapeutic Exercise: Seated Pulley x 2 min flexion  Standing finger ladder x 10 flexion, x 5 abduction  Supine shoulder flexion with dowel 2x10 Supine chest press with dowel 2lb 2x10  Neuromuscular Re-ed: Seated  scapular retraction 2x10 (pt reports pain relief) Seated shoulder shrugs 2x10 Sidelying shoulder trio: abduction, ER x 10 each -- stopped today shoulder flexion d/t pain, revisit at next session   PATIENT EDUCATION: Education details: rationale for interventions, HEP  Person educated: Patient Education method: Explanation, Demonstration, Tactile cues,  Verbal cues Education comprehension: verbalized understanding, returned demonstration, verbal cues required, tactile cues required, and needs further education     HOME EXERCISE PROGRAM: Access Code: [email or web address] URL: [email or web address] Date: 11/20/2023 Prepared by: Alm Jenny  Exercises - Seated Shoulder Shrugs  - 1 x daily - 7 x weekly - 2 sets - 10 reps - 3 sec hold - Seated Bilateral Shoulder Flexion Towel Slide at Table Top  - 1 x daily - 7 x weekly - 2 sets - 10 reps - 3 sec hold - Seated Shoulder Abduction Towel Slide at Table Top with Forearm in Neutral  - 1 x daily - 7 x weekly - 2 sets - 10 reps - Standing Shoulder Row with Anchored Resistance  - 1 x daily - 7 x weekly - 2-3 sets - 8 reps  ASSESSMENT:  CLINICAL IMPRESSION: Patient presents to PT reporting continued pain in her Rt shoulder, states that the manual performed last session was helpful. She displayed less tolerance for strengthening today, especially with motion >90 flexion. Manual techniques performed at end of session significantly reduce her pain. Patient continues to benefit from skilled PT services and should be progressed as able to improve functional independence.   Per eval: Nalea is a 58 y.o. female who was seen today for physical therapy evaluation and treatment for persistent R shoulder and Thoracic pain. She is demonstrating decreased R shoulder AROM, inability to tolerate testing position for shoulder impingement tests, and pain with palpation along BIL TS spine. She has related pain and difficulty with overhead reaching, lifting, upper body  dressing, bathing, household management and occupational duties. She does respond well to gentle GH distraction and Grade II lateral mobilization, reporting mild pain relief.  She requires skilled PT services at this time to address relevant deficits and improve overall function.     OBJECTIVE IMPAIRMENTS: decreased activity tolerance, decreased ROM, decreased strength, impaired UE functional use, postural dysfunction, and pain.   ACTIVITY LIMITATIONS: carrying, lifting, sleeping, bathing, and dressing  PARTICIPATION LIMITATIONS: meal prep, cleaning, laundry, interpersonal relationship, shopping, and occupation  PERSONAL FACTORS: Profession, Time since onset of injury/illness/exacerbation, and 3+ comorbidities: Relevant PMHx includes T12 burst fracture, HA, Parkinson's Disease, Chronic Neck Pain, Sprain of Right Rotator Cuff Capsule, R Wrist Pain are also affecting patient's functional outcome.   REHAB POTENTIAL: Fair    CLINICAL DECISION MAKING: Evolving/moderate complexity  EVALUATION COMPLEXITY: Moderate   GOALS: Goals reviewed with patient? YES  SHORT TERM GOALS: Target date: 12/08/2023   Patient will be independent with initial home program at least 3 days/week.  Baseline: provided at eval Goal Status: MET Pt reports adherence 12/02/23   2.  Patient will demonstrate improved postural awareness for at least 15 minutes while seated without need for cueing from PT.  Baseline: see objective measures Goal Status: INITIAL   3.  Patient will demonstrate improved R Shoulder elevation AROM to at least 145 degrees.  Baseline: 84 flexion, 80 abduction  Goal status: INITIAL   LONG TERM GOALS: Target date: 01/05/2024   Patient will report improved overall functional ability with QuickDASH score of 40 or less.  Baseline: 56 Goal Status: INITIAL   2.  Patient demonstrate improved functional IR reach to at least T12 in order to improved ability to wash her back, without exacerbation of  symptoms.  Baseline: T3 Goal status: INITIAL  3.  Patient will demonstrate ability to perform overhead lifting of at least 10# using appropriate body mechanics and with no more than minimal pain in  order to safely perform normal daily/occupational tasks.   Baseline: unable to lift higher than chest height  Goal Status: INITIAL    4.  Patient will report ability to perform normal occupational duties, with no more than 4/10 pain at end of most work days.   Baseline: 10/10 pain   Goal Status: INITIAL     PLAN:  PT FREQUENCY: 1-2x/week  PT DURATION: 8 weeks  PLANNED INTERVENTIONS: 97164- PT Re-evaluation, 97110-Therapeutic exercises, 97530- Therapeutic activity, 97112- Neuromuscular re-education, 97535- Self Care, [id number]- Manual therapy, [id number]- Electrical stimulation (unattended), Patient/Family education, Taping, Joint mobilization, Spinal mobilization, Cryotherapy, and Moist heat   [employer] Authorization   Choose one: Rehabilitative  Standardized Assessment or Functional Outcome Tool: See Pain Assessment and Quick DASH  Score or Percent Disability: 56.8  Body Parts Treated (Select each separately):  Shoulder. Overall deficits/functional limitations for body part selected: moderate Cervicothoracic. Overall deficits/functional limitations for body part selected: moderate    PLAN FOR NEXT SESSION: address periscapular/postural mm endurance, address TS and R shoulder mobility, manual therapy and modalities as indicated, otherwise progress towards established rehab goals.    Corean Pouch PTA  12/12/2023 11:10 AM

## 2023-12-15 ENCOUNTER — Telehealth: Payer: Self-pay | Admitting: Neurology

## 2023-12-15 ENCOUNTER — Other Ambulatory Visit: Payer: Self-pay

## 2023-12-15 NOTE — Telephone Encounter (Signed)
 Pt.s daughter called as she rcvd two conflicting statements, Nurse tld Pt needs to give consent on Botox before appt to be made and Pharmacy says consent was already given. Pt would like to make botox appt, pls cl back and advise if consent

## 2023-12-15 NOTE — Telephone Encounter (Signed)
 Left a message to sch for her Botox

## 2023-12-16 ENCOUNTER — Ambulatory Visit: Payer: Self-pay | Attending: Nurse Practitioner

## 2023-12-16 DIAGNOSIS — M6281 Muscle weakness (generalized): Secondary | ICD-10-CM | POA: Insufficient documentation

## 2023-12-16 DIAGNOSIS — M546 Pain in thoracic spine: Secondary | ICD-10-CM | POA: Diagnosis not present

## 2023-12-16 DIAGNOSIS — M25511 Pain in right shoulder: Secondary | ICD-10-CM | POA: Insufficient documentation

## 2023-12-16 DIAGNOSIS — R293 Abnormal posture: Secondary | ICD-10-CM | POA: Insufficient documentation

## 2023-12-16 DIAGNOSIS — G8929 Other chronic pain: Secondary | ICD-10-CM | POA: Diagnosis not present

## 2023-12-16 NOTE — Therapy (Signed)
 OUTPATIENT PHYSICAL THERAPY TREATMENT   Patient Name: Marissa Garcia MRN: [id number] DOB:28-Sep-1965, 58 y.o., female Today's Date: 12/16/2023  END OF SESSION:  PT End of Session - 12/16/23 1744     Visit Number 10    Number of Visits 16    Date for PT Re-Evaluation 01/05/24    Authorization Type [facility]    Authorization Time Period 10 PT visits from 11/10/23-01/09/24    Authorization - Visit Number 9    Authorization - Number of Visits 10    PT Start Time 1745    PT Stop Time 1823    PT Time Calculation (min) 38 min    Activity Tolerance Patient tolerated treatment well    Behavior During Therapy [facility] for tasks assessed/performed          Past Medical History:  Diagnosis Date   Burst fracture of T12 vertebra (HCC) 10/13/2018   Complication of anesthesia    Constipation    Headache    Hypercholesteremia    Parkinson's disease (HCC)    Past Surgical History:  Procedure Laterality Date   CESAREAN SECTION     Patient Active Problem List   Diagnosis Date Noted   Lumbar pain 03/22/2022   Impingement syndrome of right shoulder 08/05/2019   Chronic neck pain 08/05/2019   Sprain of right rotator cuff capsule 07/14/2019   Body mass index (BMI) 27.0-27.9, adult 07/14/2019   Nutritional counseling 07/13/2019   Parkinson's disease (HCC) 04/22/2019   Lumbar radiculopathy 04/22/2019   Burst fracture of thoracic vertebra (HCC) 10/15/2018   Atrophic vulvovaginitis 01/25/2016   Perimenopausal 01/25/2016   Dental caries 07/05/2010   Headache 03/21/2010   Anxiety state 02/27/2010   ACID REFLUX DISEASE 02/27/2010   WRIST PAIN, RIGHT 02/27/2010   DIZZINESS 02/27/2010   CHEST PAIN 02/15/2010   HYPERCHOLESTEROLEMIA 05/13/2008    PCP: Theotis Haze ORN, NP  REFERRING PROVIDER: Delbert Clam, MD  REFERRING DIAG:  M75.41 (ICD-10-CM) - Impingement syndrome of right shoulder  M54.6,G89.29 (ICD-10-CM) - Chronic midline thoracic back pain    THERAPY DIAG:  Chronic right  shoulder pain  Muscle weakness (generalized)  Pain in thoracic spine  Abnormal posture   Rationale for Evaluation and Treatment: Rehabilitation  ONSET DATE: 10/13/2018  SUBJECTIVE:                                                                                                                                                                                     Per eval: Patient presents to PT today d/t R shoulder pain and upper back pain that has been present since her fall in 2020. She states that her symptoms started after a fall, but  they were just focused on my back at the time, so they didn't really deal with my arm. She reports that it has been getting worse of the past few months. She would like to know what type of exercises that she can do at home. She has most difficulty with raising her R arm and reaching her back.  Patient verbalizes understanding that she can utilize interpretation services, however she states that she does not need that. She does prefer to receive written information in Spanish.  Hand dominance: Right  SUBJECTIVE STATEMENT: Patient reports that she is feeling good today, is not having pain. She states she was able to sleep well after the last session after the [facility] and had a relaxing weekend.    PERTINENT HISTORY: Relevant PMHx includes T12 burst fracture, HA, Parkinson's Disease, Chronic Neck Pain, Sprain of Right Rotator Cuff Capsule, R Wrist Pain  PAIN:  Are you having pain? 8/10 R shoulder blade, no shoulder pain  Per eval: Yes: NPRS scale: 10/10 Pain location: R shoulder and BIL TS, occasionally radiates to R side of neck Pain description: burning, R hand paresthesia. Aggravating factors: lifting arm  Relieving factors: topical cream  PRECAUTIONS: None  RED FLAGS: None   WEIGHT BEARING RESTRICTIONS: No  FALLS:  Has patient fallen in last 6 months? No  LIVING ENVIRONMENT: Lives with: lives with their family and lives with their  daughter Lives in: House/apartment Stairs: Yes: Internal: 1 flight steps; not using d/t bedroom on first floor  Has following equipment at home: None  OCCUPATION: Cleaning houses  PLOF: Independent  PATIENT GOALS:To have less pain, be able to lift her arm, wash her back and perform work activities   NEXT MD VISIT: 11/11/2023 Neurology Appt  OBJECTIVE:  Note: Objective measures were completed at Evaluation unless otherwise noted.  DIAGNOSTIC FINDINGS:  10/13/2023: R SHOULDER XRAY FINDINGS: There is no evidence of fracture or dislocation. There is no evidence of arthropathy or other focal bone abnormality. Soft tissues are unremarkable.   IMPRESSION: Negative.  10/13/2023: CHEST XRAY FINDINGS: The heart size and mediastinal contours are within normal limits. Both lungs are clear. The visualized skeletal structures are unremarkable.   IMPRESSION: No active cardiopulmonary disease.   PATIENT SURVEYS:  Quick DASH: 56.8  COGNITION: Overall cognitive status: Within functional limits for tasks assessed     SENSATION: Not tested  POSTURE: Rounded Shoulders  UPPER EXTREMITY ROM:   Active ROM Right eval Left eval R 11/18/23  Shoulder flexion A 84 (P 110) WFL A: 114 deg *  Shoulder extension     Shoulder abduction A 80 (P 85) WFL   Shoulder adduction     Shoulder internal rotation Functional reach to L3    Shoulder external rotation     Elbow flexion     Elbow extension     Wrist flexion     Wrist extension     Wrist ulnar deviation     Wrist radial deviation     Wrist pronation     Wrist supination     (Blank rows = not tested)  UPPER EXTREMITY MMT:  MMT Right eval Left eval  Shoulder flexion    Shoulder extension    Shoulder abduction    Shoulder adduction    Shoulder internal rotation    Shoulder external rotation    Middle trapezius    Lower trapezius    Elbow flexion    Elbow extension    Wrist flexion    Wrist extension  Wrist ulnar deviation     Wrist radial deviation    Wrist pronation    Wrist supination    Grip strength (lbs)    (Blank rows = not tested)  SHOULDER SPECIAL TESTS: Impingement tests: Neer impingement test: positive  and Hawkins/Kennedy impingement test: positive  Positive result, subsequent to intolerance of testing positions d/t painful passive and active shoulder elevation AROM    Mild symptom relief following gentle GH distraction and Grade II lateral mobilization   PALPATION:  Pain with palpation throughout TS CPA; painfree throughout LS CPA Pain with palpation along medial border of BIL scapula, and BIL parascapular mm.                                                                                                                              TREATMENT DATE:  [facility] Adult PT Treatment:                                                DATE: 12/16/23 Therapeutic Exercise: Seated Pulley x 3 min flexion  Supine shoulder flexion with dowel 2x10 Supine chest press with dowel 2x10 Supine AAROM shoulder flexion RUE x1 (increased pain) Manual Therapy: STM and myofascial trigger point release for L UT, scalene, levator scap So release Positional release Rt upper trap Neuromuscular re-ed: Supine L shoulder isometric from 45 degrees of scaption  Standing shoulder rows RTB 2 x 10  Standing shoulder extension RTB 2 x 10   [facility] Adult PT Treatment:                                                DATE: 12/11/23 Therapeutic Exercise: Seated Pulley x 3 min flexion  Supine shoulder flexion with dowel 2# x8 Supine chest press with dowel x8 Manual Therapy: STM and myofascial trigger point release for L UT, scalene, levator scap Positional release Rt upper trap Neuromuscular re-ed: Supine L shoulder isometric from 45 degrees of scaption  Seated scapular retraction 2x10 (pt reports pain relief) Standing shoulder rows RTB 2 x 10  Standing shoulder extension RTB 2 x 10    [facility] Adult PT Treatment:                                                 DATE: 12/09/2023  Manual Therapy:  STM and myofascial trigger point release for L UT, scalene, levator scap  Neuromuscular Re-ed: Supine L shoulder isometric from 45 degrees of scaption  Seated scapular retraction 2x10 (pt reports pain relief) Standing shoulder rows RTB 2 x 10  Standing shoulder extension RTB 2 x 10    PATIENT EDUCATION: Education details: rationale for interventions, HEP  Person educated: Patient Education method: Explanation, Demonstration, Tactile cues, Verbal cues Education comprehension: verbalized understanding, returned demonstration, verbal cues required, tactile cues required, and needs further education     HOME EXERCISE PROGRAM: Access Code: [email or web address] URL: [email or web address] Date: 11/20/2023 Prepared by: Alm Jenny  Exercises - Seated Shoulder Shrugs  - 1 x daily - 7 x weekly - 2 sets - 10 reps - 3 sec hold - Seated Bilateral Shoulder Flexion Towel Slide at Table Top  - 1 x daily - 7 x weekly - 2 sets - 10 reps - 3 sec hold - Seated Shoulder Abduction Towel Slide at Table Top with Forearm in Neutral  - 1 x daily - 7 x weekly - 2 sets - 10 reps - Standing Shoulder Row with Anchored Resistance  - 1 x daily - 7 x weekly - 2-3 sets - 8 reps  ASSESSMENT:  CLINICAL IMPRESSION: Patient presents to PT reporting no current pain and that she felt much improved after last session from manual techniques. We were able to complete more exercises today without increasing her pain. Continued manual techniques as she has the most benefit from these. Patient was able to tolerate all prescribed exercises with no adverse effects. Patient continues to benefit from skilled PT services and should be progressed as able to improve functional independence.   Per eval: Aniesa is a 58 y.o. female who was seen today for physical therapy evaluation and treatment for persistent R shoulder and Thoracic pain. She is demonstrating decreased R  shoulder AROM, inability to tolerate testing position for shoulder impingement tests, and pain with palpation along BIL TS spine. She has related pain and difficulty with overhead reaching, lifting, upper body dressing, bathing, household management and occupational duties. She does respond well to gentle GH distraction and Grade II lateral mobilization, reporting mild pain relief.  She requires skilled PT services at this time to address relevant deficits and improve overall function.     OBJECTIVE IMPAIRMENTS: decreased activity tolerance, decreased ROM, decreased strength, impaired UE functional use, postural dysfunction, and pain.   ACTIVITY LIMITATIONS: carrying, lifting, sleeping, bathing, and dressing  PARTICIPATION LIMITATIONS: meal prep, cleaning, laundry, interpersonal relationship, shopping, and occupation  PERSONAL FACTORS: Profession, Time since onset of injury/illness/exacerbation, and 3+ comorbidities: Relevant PMHx includes T12 burst fracture, HA, Parkinson's Disease, Chronic Neck Pain, Sprain of Right Rotator Cuff Capsule, R Wrist Pain are also affecting patient's functional outcome.   REHAB POTENTIAL: Fair    CLINICAL DECISION MAKING: Evolving/moderate complexity  EVALUATION COMPLEXITY: Moderate   GOALS: Goals reviewed with patient? YES  SHORT TERM GOALS: Target date: 12/08/2023   Patient will be independent with initial home program at least 3 days/week.  Baseline: provided at eval Goal Status: MET Pt reports adherence 12/02/23   2.  Patient will demonstrate improved postural awareness for at least 15 minutes while seated without need for cueing from PT.  Baseline: see objective measures Goal Status: INITIAL   3.  Patient will demonstrate improved R Shoulder elevation AROM to at least 145 degrees.  Baseline: 84 flexion, 80 abduction  Goal status: INITIAL   LONG TERM GOALS: Target date: 01/05/2024   Patient will report improved overall functional ability with  QuickDASH score of 40 or less.  Baseline: 56 Goal Status: INITIAL   2.  Patient demonstrate improved functional IR reach to at least T12 in order to improved ability to  wash her back, without exacerbation of symptoms.  Baseline: T3 Goal status: INITIAL  3.  Patient will demonstrate ability to perform overhead lifting of at least 10# using appropriate body mechanics and with no more than minimal pain in order to safely perform normal daily/occupational tasks.   Baseline: unable to lift higher than chest height  Goal Status: INITIAL    4.  Patient will report ability to perform normal occupational duties, with no more than 4/10 pain at end of most work days.   Baseline: 10/10 pain   Goal Status: INITIAL     PLAN:  PT FREQUENCY: 1-2x/week  PT DURATION: 8 weeks  PLANNED INTERVENTIONS: 97164- PT Re-evaluation, 97110-Therapeutic exercises, 97530- Therapeutic activity, 97112- Neuromuscular re-education, 97535- Self Care, [id number]- Manual therapy, [id number]- Electrical stimulation (unattended), Patient/Family education, Taping, Joint mobilization, Spinal mobilization, Cryotherapy, and Moist heat   [employer] Authorization   Choose one: Rehabilitative  Standardized Assessment or Functional Outcome Tool: See Pain Assessment and Quick DASH  Score or Percent Disability: 56.8  Body Parts Treated (Select each separately):  Shoulder. Overall deficits/functional limitations for body part selected: moderate Cervicothoracic. Overall deficits/functional limitations for body part selected: moderate    PLAN FOR NEXT SESSION: address periscapular/postural mm endurance, address TS and R shoulder mobility, manual therapy and modalities as indicated, otherwise progress towards established rehab goals.    Corean Pouch PTA  12/16/2023 6:25 PM

## 2023-12-17 NOTE — Therapy (Incomplete)
 OUTPATIENT PHYSICAL THERAPY PROGRESS NOTE + RECERTIFICATION   Patient Name: Marissa Garcia MRN: [id number] DOB:[birth year], 58 y.o., female Today's Date: 12/17/2023   Progress Note Reporting Period 11/10/23 to 12/18/23  See note below for Objective Data and Assessment of Progress/Goals.      END OF SESSION:    Past Medical History:  Diagnosis Date   Burst fracture of T12 vertebra (HCC) 10/13/2018   Complication of anesthesia    Constipation    Headache    Hypercholesteremia    Parkinson's disease [facility])    Past Surgical History:  Procedure Laterality Date   CESAREAN SECTION     Patient Active Problem List   Diagnosis Date Noted   Lumbar pain 03/22/2022   Impingement syndrome of right shoulder 08/05/2019   Chronic neck pain 08/05/2019   Sprain of right rotator cuff capsule 07/14/2019   Body mass index (BMI) 27.0-27.9, adult 07/14/2019   Nutritional counseling 07/13/2019   Parkinson's disease (HCC) 04/22/2019   Lumbar radiculopathy 04/22/2019   Burst fracture of thoracic vertebra (HCC) 10/15/2018   Atrophic vulvovaginitis 01/25/2016   Perimenopausal 01/25/2016   Dental caries 07/05/2010   Headache 03/21/2010   Anxiety state 02/27/2010   ACID REFLUX DISEASE 02/27/2010   WRIST PAIN, RIGHT 02/27/2010   DIZZINESS 02/27/2010   CHEST PAIN 02/15/2010   HYPERCHOLESTEROLEMIA 05/13/2008    PCP: Theotis Haze ORN, NP  REFERRING PROVIDER: Delbert Clam, MD  REFERRING DIAG:  M75.41 (ICD-10-CM) - Impingement syndrome of right shoulder  M54.6,G89.29 (ICD-10-CM) - Chronic midline thoracic back pain    THERAPY DIAG:  No diagnosis found.   Rationale for Evaluation and Treatment: Rehabilitation  ONSET DATE: 10/13/2018  SUBJECTIVE:                                                                                                                                                                                     Per eval: Patient presents to PT today d/t R shoulder pain and upper  back pain that has been present since her fall in 2020. She states that her symptoms started after a fall, but they were just focused on my back at the time, so they didn't really deal with my arm. She reports that it has been getting worse of the past few months. She would like to know what type of exercises that she can do at home. She has most difficulty with raising her R arm and reaching her back.  Patient verbalizes understanding that she can utilize interpretation services, however she states that she does not need that. She does prefer to receive written information in Spanish.  Hand dominance: Right  SUBJECTIVE STATEMENT: 12/17/2023: ***  *** Patient  reports that she is feeling good today, is not having pain. She states she was able to sleep well after the last session after the [facility] and had a relaxing weekend.    PERTINENT HISTORY: Relevant PMHx includes T12 burst fracture, HA, Parkinson's Disease, Chronic Neck Pain, Sprain of Right Rotator Cuff Capsule, R Wrist Pain  PAIN:  Are you having pain? *** 8/10 R shoulder blade, no shoulder pain  Per eval: Yes: NPRS scale: 10/10 Pain location: R shoulder and BIL TS, occasionally radiates to R side of neck Pain description: burning, R hand paresthesia. Aggravating factors: lifting arm  Relieving factors: topical cream  PRECAUTIONS: None  RED FLAGS: None   WEIGHT BEARING RESTRICTIONS: No  FALLS:  Has patient fallen in last 6 months? No  LIVING ENVIRONMENT: Lives with: lives with their family and lives with their daughter Lives in: House/apartment Stairs: Yes: Internal: 1 flight steps; not using d/t bedroom on first floor  Has following equipment at home: None  OCCUPATION: Cleaning houses  PLOF: Independent  PATIENT GOALS:To have less pain, be able to lift her arm, wash her back and perform work activities   NEXT MD VISIT: 11/11/2023 Neurology Appt  OBJECTIVE:  Note: Objective measures were completed at Evaluation unless  otherwise noted.  DIAGNOSTIC FINDINGS:  10/13/2023: R SHOULDER XRAY FINDINGS: There is no evidence of fracture or dislocation. There is no evidence of arthropathy or other focal bone abnormality. Soft tissues are unremarkable.   IMPRESSION: Negative.  10/13/2023: CHEST XRAY FINDINGS: The heart size and mediastinal contours are within normal limits. Both lungs are clear. The visualized skeletal structures are unremarkable.   IMPRESSION: No active cardiopulmonary disease.   PATIENT SURVEYS:  Quick DASH: 56.8  12/18/23: ***  COGNITION: Overall cognitive status: Within functional limits for tasks assessed     SENSATION: Not tested  POSTURE: Rounded Shoulders  UPPER EXTREMITY ROM:   Active ROM Right eval Left eval R 11/18/23 R 12/18/23  Shoulder flexion A 84 (P 110) WFL A: 114 deg * ***   Shoulder extension      Shoulder abduction A 80 (P 85) WFL    Shoulder adduction      Shoulder internal rotation Functional reach to L3     Shoulder external rotation      Elbow flexion      Elbow extension      Wrist flexion      Wrist extension      Wrist ulnar deviation      Wrist radial deviation      Wrist pronation      Wrist supination      (Blank rows = not tested)  UPPER EXTREMITY MMT:  MMT Right eval Left eval  Shoulder flexion    Shoulder extension    Shoulder abduction    Shoulder adduction    Shoulder internal rotation    Shoulder external rotation    Middle trapezius    Lower trapezius    Elbow flexion    Elbow extension    Wrist flexion    Wrist extension    Wrist ulnar deviation    Wrist radial deviation    Wrist pronation    Wrist supination    Grip strength (lbs)    (Blank rows = not tested)  SHOULDER SPECIAL TESTS: Impingement tests: Neer impingement test: positive  and Hawkins/Kennedy impingement test: positive  Positive result, subsequent to intolerance of testing positions d/t painful passive and active shoulder elevation AROM    Mild symptom  relief following gentle GH distraction and Grade II lateral mobilization   PALPATION:  Pain with palpation throughout TS CPA; painfree throughout LS CPA Pain with palpation along medial border of BIL scapula, and BIL parascapular mm.                                                                                                                              TREATMENT DATE:  [facility] Adult PT Treatment:                                                DATE: 12/18/23 Therapeutic Exercise: *** Manual Therapy: *** Neuromuscular re-ed: *** Therapeutic Activity: *** Modalities: *** Self Care: ***    [facility] Adult PT Treatment:                                                DATE: 12/16/23 Therapeutic Exercise: Seated Pulley x 3 min flexion  Supine shoulder flexion with dowel 2x10 Supine chest press with dowel 2x10 Supine AAROM shoulder flexion RUE x1 (increased pain) Manual Therapy: STM and myofascial trigger point release for L UT, scalene, levator scap So release Positional release Rt upper trap Neuromuscular re-ed: Supine L shoulder isometric from 45 degrees of scaption  Standing shoulder rows RTB 2 x 10  Standing shoulder extension RTB 2 x 10   [facility] Adult PT Treatment:                                                DATE: 12/11/23 Therapeutic Exercise: Seated Pulley x 3 min flexion  Supine shoulder flexion with dowel 2# x8 Supine chest press with dowel x8 Manual Therapy: STM and myofascial trigger point release for L UT, scalene, levator scap Positional release Rt upper trap Neuromuscular re-ed: Supine L shoulder isometric from 45 degrees of scaption  Seated scapular retraction 2x10 (pt reports pain relief) Standing shoulder rows RTB 2 x 10  Standing shoulder extension RTB 2 x 10    [facility] Adult PT Treatment:                                                DATE: 12/09/2023  Manual Therapy:  STM and myofascial trigger point release for L UT, scalene, levator scap  Neuromuscular  Re-ed: Supine L shoulder isometric from 45 degrees of scaption  Seated scapular retraction 2x10 (pt reports pain relief) Standing shoulder rows RTB 2 x 10  Standing shoulder extension RTB 2 x 10    PATIENT EDUCATION: Education details: rationale for interventions, HEP  Person educated: Patient Education method: Explanation, Demonstration, Tactile cues, Verbal cues Education comprehension: verbalized understanding, returned demonstration, verbal cues required, tactile cues required, and needs further education     HOME EXERCISE PROGRAM: Access Code: [email or web address] URL: [email or web address] Date: 11/20/2023 Prepared by: Alm Jenny  Exercises - Seated Shoulder Shrugs  - 1 x daily - 7 x weekly - 2 sets - 10 reps - 3 sec hold - Seated Bilateral Shoulder Flexion Towel Slide at Table Top  - 1 x daily - 7 x weekly - 2 sets - 10 reps - 3 sec hold - Seated Shoulder Abduction Towel Slide at Table Top with Forearm in Neutral  - 1 x daily - 7 x weekly - 2 sets - 10 reps - Standing Shoulder Row with Anchored Resistance  - 1 x daily - 7 x weekly - 2-3 sets - 8 reps  ASSESSMENT:  CLINICAL IMPRESSION: 12/17/2023: ***  *** Patient presents to PT reporting no current pain and that she felt much improved after last session from manual techniques. We were able to complete more exercises today without increasing her pain. Continued manual techniques as she has the most benefit from these. Patient was able to tolerate all prescribed exercises with no adverse effects. Patient continues to benefit from skilled PT services and should be progressed as able to improve functional independence.   Per eval: Kiamesha is a 58 y.o. female who was seen today for physical therapy evaluation and treatment for persistent R shoulder and Thoracic pain. She is demonstrating decreased R shoulder AROM, inability to tolerate testing position for shoulder impingement tests, and pain with palpation along BIL TS spine. She  has related pain and difficulty with overhead reaching, lifting, upper body dressing, bathing, household management and occupational duties. She does respond well to gentle GH distraction and Grade II lateral mobilization, reporting mild pain relief.  She requires skilled PT services at this time to address relevant deficits and improve overall function.     OBJECTIVE IMPAIRMENTS: decreased activity tolerance, decreased ROM, decreased strength, impaired UE functional use, postural dysfunction, and pain.   ACTIVITY LIMITATIONS: carrying, lifting, sleeping, bathing, and dressing  PARTICIPATION LIMITATIONS: meal prep, cleaning, laundry, interpersonal relationship, shopping, and occupation  PERSONAL FACTORS: Profession, Time since onset of injury/illness/exacerbation, and 3+ comorbidities: Relevant PMHx includes T12 burst fracture, HA, Parkinson's Disease, Chronic Neck Pain, Sprain of Right Rotator Cuff Capsule, R Wrist Pain are also affecting patient's functional outcome.   REHAB POTENTIAL: Fair    CLINICAL DECISION MAKING: Evolving/moderate complexity  EVALUATION COMPLEXITY: Moderate   GOALS: Goals reviewed with patient? YES  SHORT TERM GOALS: Target date: 12/08/2023   Patient will be independent with initial home program at least 3 days/week.  Baseline: provided at eval Goal Status: MET Pt reports adherence 12/02/23   2.  Patient will demonstrate improved postural awareness for at least 15 minutes while seated without need for cueing from PT.  Baseline: see objective measures 12/18/23: ***   Goal Status: ***  3.  Patient will demonstrate improved R Shoulder elevation AROM to at least 145 degrees.  Baseline: 84 flexion, 80 abduction  12/18/23: ***  Goal Status: ***   LONG TERM GOALS: Target date: 01/05/2024   Patient will report improved overall functional ability with QuickDASH score of 40 or less.  Baseline: 56 12/18/23: ***  Goal Status: ***  2.  Patient demonstrate improved  functional  IR reach to at least T12 in order to improved ability to wash her back, without exacerbation of symptoms.  Baseline: T3 12/18/23: ***  Goal Status: ***  3.  Patient will demonstrate ability to perform overhead lifting of at least 10# using appropriate body mechanics and with no more than minimal pain in order to safely perform normal daily/occupational tasks.   Baseline: unable to lift higher than chest height 12/18/23: ***   Goal Status: ***   4.  Patient will report ability to perform normal occupational duties, with no more than 4/10 pain at end of most work days.   Baseline: 10/10 pain 12/18/23: ***   Goal Status: ***    PLAN: (updated 12/18/23) ***   PT FREQUENCY: 1-2x/week  PT DURATION: 8 weeks  PLANNED INTERVENTIONS: 02835- PT Re-evaluation, 97110-Therapeutic exercises, 97530- Therapeutic activity, 97112- Neuromuscular re-education, 97535- Self Care, 02859- Manual therapy, [id number]- Electrical stimulation (unattended), Patient/Family education, Taping, Joint mobilization, Spinal mobilization, Cryotherapy, and Moist heat   [employer] Authorization   Choose one: Rehabilitative  Standardized Assessment or Functional Outcome Tool: See Pain Assessment and Quick DASH  Score or Percent Disability: 56.8 (eval) 12/18/23: ***   Body Parts Treated (Select each separately):  Shoulder. Overall deficits/functional limitations for body part selected: moderate Cervicothoracic. Overall deficits/functional limitations for body part selected: moderate    PLAN FOR NEXT SESSION: address periscapular/postural mm endurance, address TS and R shoulder mobility, manual therapy and modalities as indicated, otherwise progress towards established rehab goals.    Alm DELENA Jenny PT, DPT 12/17/2023 11:24 AM

## 2023-12-18 ENCOUNTER — Ambulatory Visit: Payer: Self-pay | Admitting: Physical Therapy

## 2023-12-18 NOTE — Telephone Encounter (Signed)
 Pt is sch for 01-09-24

## 2023-12-23 DIAGNOSIS — Z419 Encounter for procedure for purposes other than remedying health state, unspecified: Secondary | ICD-10-CM | POA: Diagnosis not present

## 2023-12-24 ENCOUNTER — Ambulatory Visit

## 2023-12-24 DIAGNOSIS — M25511 Pain in right shoulder: Secondary | ICD-10-CM | POA: Diagnosis not present

## 2023-12-24 DIAGNOSIS — R293 Abnormal posture: Secondary | ICD-10-CM

## 2023-12-24 DIAGNOSIS — G8929 Other chronic pain: Secondary | ICD-10-CM

## 2023-12-24 DIAGNOSIS — M546 Pain in thoracic spine: Secondary | ICD-10-CM

## 2023-12-24 DIAGNOSIS — M6281 Muscle weakness (generalized): Secondary | ICD-10-CM

## 2023-12-24 NOTE — Therapy (Signed)
 OUTPATIENT PHYSICAL THERAPY TREATMENT   Patient Name: Marissa Garcia MRN: [id number] DOB:February 10, 1966, 58 y.o., female Today's Date: 12/24/2023  END OF SESSION:  PT End of Session - 12/24/23 1451     Visit Number 11    Number of Visits 16    Date for PT Re-Evaluation 01/05/24    Authorization Type [facility]    Authorization Time Period 10 PT visits from 11/10/23-01/09/24    Authorization - Visit Number 10    Authorization - Number of Visits 10    PT Start Time 1448    PT Stop Time 1527    PT Time Calculation (min) 39 min    Activity Tolerance Patient tolerated treatment well    Behavior During Therapy [facility] for tasks assessed/performed           Past Medical History:  Diagnosis Date   Burst fracture of T12 vertebra (HCC) 10/13/2018   Complication of anesthesia    Constipation    Headache    Hypercholesteremia    Parkinson's disease (HCC)    Past Surgical History:  Procedure Laterality Date   CESAREAN SECTION     Patient Active Problem List   Diagnosis Date Noted   Lumbar pain 03/22/2022   Impingement syndrome of right shoulder 08/05/2019   Chronic neck pain 08/05/2019   Sprain of right rotator cuff capsule 07/14/2019   Body mass index (BMI) 27.0-27.9, adult 07/14/2019   Nutritional counseling 07/13/2019   Parkinson's disease (HCC) 04/22/2019   Lumbar radiculopathy 04/22/2019   Burst fracture of thoracic vertebra (HCC) 10/15/2018   Atrophic vulvovaginitis 01/25/2016   Perimenopausal 01/25/2016   Dental caries 07/05/2010   Headache 03/21/2010   Anxiety state 02/27/2010   ACID REFLUX DISEASE 02/27/2010   WRIST PAIN, RIGHT 02/27/2010   DIZZINESS 02/27/2010   CHEST PAIN 02/15/2010   HYPERCHOLESTEROLEMIA 05/13/2008    PCP: Theotis Haze ORN, NP  REFERRING PROVIDER: Delbert Clam, MD  REFERRING DIAG:  M75.41 (ICD-10-CM) - Impingement syndrome of right shoulder  M54.6,G89.29 (ICD-10-CM) - Chronic midline thoracic back pain    THERAPY DIAG:  Chronic right  shoulder pain  Muscle weakness (generalized)  Abnormal posture  Pain in thoracic spine   Rationale for Evaluation and Treatment: Rehabilitation  ONSET DATE: 10/13/2018  SUBJECTIVE:                                                                                                                                                                                     Per eval: Patient presents to PT today d/t R shoulder pain and upper back pain that has been present since her fall in 2020. She states that her symptoms started after a fall,  but they were just focused on my back at the time, so they didn't really deal with my arm. She reports that it has been getting worse of the past few months. She would like to know what type of exercises that she can do at home. She has most difficulty with raising her R arm and reaching her back.  Patient verbalizes understanding that she can utilize interpretation services, however she states that she does not need that. She does prefer to receive written information in Spanish.  Hand dominance: Right  SUBJECTIVE STATEMENT: Patient reports increased pain today, at a 7/10 today, was at a 10/10 overnight last night.   PERTINENT HISTORY: Relevant PMHx includes T12 burst fracture, HA, Parkinson's Disease, Chronic Neck Pain, Sprain of Right Rotator Cuff Capsule, R Wrist Pain  PAIN:  Are you having pain? 8/10 R shoulder blade, no shoulder pain  Per eval: Yes: NPRS scale: 10/10 Pain location: R shoulder and BIL TS, occasionally radiates to R side of neck Pain description: burning, R hand paresthesia. Aggravating factors: lifting arm  Relieving factors: topical cream  PRECAUTIONS: None  RED FLAGS: None   WEIGHT BEARING RESTRICTIONS: No  FALLS:  Has patient fallen in last 6 months? No  LIVING ENVIRONMENT: Lives with: lives with their family and lives with their daughter Lives in: House/apartment Stairs: Yes: Internal: 1 flight steps; not using d/t  bedroom on first floor  Has following equipment at home: None  OCCUPATION: Cleaning houses  PLOF: Independent  PATIENT GOALS:To have less pain, be able to lift her arm, wash her back and perform work activities   NEXT MD VISIT: 11/11/2023 Neurology Appt  OBJECTIVE:  Note: Objective measures were completed at Evaluation unless otherwise noted.  DIAGNOSTIC FINDINGS:  10/13/2023: R SHOULDER XRAY FINDINGS: There is no evidence of fracture or dislocation. There is no evidence of arthropathy or other focal bone abnormality. Soft tissues are unremarkable.   IMPRESSION: Negative.  10/13/2023: CHEST XRAY FINDINGS: The heart size and mediastinal contours are within normal limits. Both lungs are clear. The visualized skeletal structures are unremarkable.   IMPRESSION: No active cardiopulmonary disease.   PATIENT SURVEYS:  Quick DASH: 56.8  COGNITION: Overall cognitive status: Within functional limits for tasks assessed     SENSATION: Not tested  POSTURE: Rounded Shoulders  UPPER EXTREMITY ROM:   Active ROM Right eval Left eval R 11/18/23 Right 12/24/23  Shoulder flexion A 84 (P 110) WFL A: 114 deg * A: 120*  Shoulder extension      Shoulder abduction A 80 (P 85) WFL    Shoulder adduction      Shoulder internal rotation Functional reach to L3     Shoulder external rotation      Elbow flexion      Elbow extension      Wrist flexion      Wrist extension      Wrist ulnar deviation      Wrist radial deviation      Wrist pronation      Wrist supination      (Blank rows = not tested)  UPPER EXTREMITY MMT:  MMT Right eval Left eval  Shoulder flexion    Shoulder extension    Shoulder abduction    Shoulder adduction    Shoulder internal rotation    Shoulder external rotation    Middle trapezius    Lower trapezius    Elbow flexion    Elbow extension    Wrist flexion    Wrist extension  Wrist ulnar deviation    Wrist radial deviation    Wrist pronation     Wrist supination    Grip strength (lbs)    (Blank rows = not tested)  SHOULDER SPECIAL TESTS: Impingement tests: Neer impingement test: positive  and Hawkins/Kennedy impingement test: positive  Positive result, subsequent to intolerance of testing positions d/t painful passive and active shoulder elevation AROM    Mild symptom relief following gentle GH distraction and Grade II lateral mobilization   PALPATION:  Pain with palpation throughout TS CPA; painfree throughout LS CPA Pain with palpation along medial border of BIL scapula, and BIL parascapular mm.                                                                                                                              TREATMENT DATE:  [facility] Adult PT Treatment:                                                DATE: 12/24/23 Therapeutic Exercise: Seated Pulley x 3 min flexion/scaption Supine shoulder flexion with dowel x5 (painful) Supine chest press with dowel 2x10 Manual Therapy: STM and myofascial trigger point release for L UT, scalene, levator scap So release Positional release Rt upper trap Neuromuscular re-ed: Standing shoulder rows RTB 2 x 10  Standing shoulder extension RTB 2 x 10 Supine Rt shoulder isometric from 45 degrees of scaption    [facility] Adult PT Treatment:                                                DATE: 12/16/23 Therapeutic Exercise: Seated Pulley x 3 min flexion  Supine shoulder flexion with dowel 2x10 Supine chest press with dowel 2x10 Supine AAROM shoulder flexion RUE x1 (increased pain) Manual Therapy: STM and myofascial trigger point release for L UT, scalene, levator scap So release Positional release Rt upper trap Neuromuscular re-ed: Supine L shoulder isometric from 45 degrees of scaption  Standing shoulder rows RTB 2 x 10  Standing shoulder extension RTB 2 x 10   [facility] Adult PT Treatment:                                                DATE: 12/11/23 Therapeutic Exercise: Seated Pulley x  3 min flexion  Supine shoulder flexion with dowel 2# x8 Supine chest press with dowel x8 Manual Therapy: STM and myofascial trigger point release for L UT, scalene, levator scap Positional release Rt upper trap Neuromuscular re-ed: Supine L shoulder isometric from 45 degrees of scaption  Seated scapular retraction 2x10 (pt reports pain relief) Standing shoulder rows RTB 2 x 10  Standing shoulder extension RTB 2 x 10     PATIENT EDUCATION: Education details: rationale for interventions, HEP  Person educated: Patient Education method: Explanation, Demonstration, Tactile cues, Verbal cues Education comprehension: verbalized understanding, returned demonstration, verbal cues required, tactile cues required, and needs further education     HOME EXERCISE PROGRAM: Access Code: [email or web address] URL: [email or web address] Date: 11/20/2023 Prepared by: Alm Jenny  Exercises - Seated Shoulder Shrugs  - 1 x daily - 7 x weekly - 2 sets - 10 reps - 3 sec hold - Seated Bilateral Shoulder Flexion Towel Slide at Table Top  - 1 x daily - 7 x weekly - 2 sets - 10 reps - 3 sec hold - Seated Shoulder Abduction Towel Slide at Table Top with Forearm in Neutral  - 1 x daily - 7 x weekly - 2 sets - 10 reps - Standing Shoulder Row with Anchored Resistance  - 1 x daily - 7 x weekly - 2-3 sets - 8 reps  ASSESSMENT:  CLINICAL IMPRESSION: Patient presents to Pt reporting increased pain in her Rt shoulder today, stating that it was at a 10/10 last night when she was trying to sleep, could not find a comfortable position. Session today focused on gentle strengthening and ROM, she did well with isometrics today, but did have increased pain with flexion. She continues to respond well to manual techniques, reporting significantly  Patient continues to benefit from skilled PT services and should be progressed as able to improve functional independence.   Per eval: Fantasy is a 58 y.o. female who was seen  today for physical therapy evaluation and treatment for persistent R shoulder and Thoracic pain. She is demonstrating decreased R shoulder AROM, inability to tolerate testing position for shoulder impingement tests, and pain with palpation along BIL TS spine. She has related pain and difficulty with overhead reaching, lifting, upper body dressing, bathing, household management and occupational duties. She does respond well to gentle GH distraction and Grade II lateral mobilization, reporting mild pain relief.  She requires skilled PT services at this time to address relevant deficits and improve overall function.     OBJECTIVE IMPAIRMENTS: decreased activity tolerance, decreased ROM, decreased strength, impaired UE functional use, postural dysfunction, and pain.   ACTIVITY LIMITATIONS: carrying, lifting, sleeping, bathing, and dressing  PARTICIPATION LIMITATIONS: meal prep, cleaning, laundry, interpersonal relationship, shopping, and occupation  PERSONAL FACTORS: Profession, Time since onset of injury/illness/exacerbation, and 3+ comorbidities: Relevant PMHx includes T12 burst fracture, HA, Parkinson's Disease, Chronic Neck Pain, Sprain of Right Rotator Cuff Capsule, R Wrist Pain are also affecting patient's functional outcome.   REHAB POTENTIAL: Fair    CLINICAL DECISION MAKING: Evolving/moderate complexity  EVALUATION COMPLEXITY: Moderate   GOALS: Goals reviewed with patient? YES  SHORT TERM GOALS: Target date: 12/08/2023   Patient will be independent with initial home program at least 3 days/week.  Baseline: provided at eval Goal Status: MET Pt reports adherence 12/02/23   2.  Patient will demonstrate improved postural awareness for at least 15 minutes while seated without need for cueing from PT.  Baseline: see objective measures Goal Status: Ongoing   3.  Patient will demonstrate improved R Shoulder elevation AROM to at least 145 degrees.  Baseline: 84 flexion, 80 abduction   Goal status: Progressing 12/24/23: 120   LONG TERM GOALS: Target date: 01/05/2024   Patient will report improved overall functional ability with QuickDASH score of  40 or less.  Baseline: 56 Goal Status: INITIAL   2.  Patient demonstrate improved functional IR reach to at least T12 in order to improved ability to wash her back, without exacerbation of symptoms.  Baseline: T3 Goal status: INITIAL  3.  Patient will demonstrate ability to perform overhead lifting of at least 10# using appropriate body mechanics and with no more than minimal pain in order to safely perform normal daily/occupational tasks.   Baseline: unable to lift higher than chest height  Goal Status: INITIAL    4.  Patient will report ability to perform normal occupational duties, with no more than 4/10 pain at end of most work days.   Baseline: 10/10 pain   Goal Status: INITIAL    PLAN:  PT FREQUENCY: 1-2x/week  PT DURATION: 8 weeks  PLANNED INTERVENTIONS: 97164- PT Re-evaluation, 97110-Therapeutic exercises, 97530- Therapeutic activity, 97112- Neuromuscular re-education, 97535- Self Care, [id number]- Manual therapy, [id number]- Electrical stimulation (unattended), Patient/Family education, Taping, Joint mobilization, Spinal mobilization, Cryotherapy, and Moist heat   [employer] Authorization   Choose one: Rehabilitative  Standardized Assessment or Functional Outcome Tool: See Pain Assessment and Quick DASH  Score or Percent Disability: 56.8  Body Parts Treated (Select each separately):  Shoulder. Overall deficits/functional limitations for body part selected: moderate Cervicothoracic. Overall deficits/functional limitations for body part selected: moderate    PLAN FOR NEXT SESSION: address periscapular/postural mm endurance, address TS and R shoulder mobility, manual therapy and modalities as indicated, otherwise progress towards established rehab goals.    Corean Pouch PTA  12/24/2023 3:37 PM

## 2023-12-26 ENCOUNTER — Ambulatory Visit

## 2023-12-26 DIAGNOSIS — M546 Pain in thoracic spine: Secondary | ICD-10-CM

## 2023-12-26 DIAGNOSIS — R293 Abnormal posture: Secondary | ICD-10-CM

## 2023-12-26 DIAGNOSIS — G8929 Other chronic pain: Secondary | ICD-10-CM | POA: Diagnosis not present

## 2023-12-26 DIAGNOSIS — M25511 Pain in right shoulder: Secondary | ICD-10-CM | POA: Diagnosis not present

## 2023-12-26 DIAGNOSIS — M6281 Muscle weakness (generalized): Secondary | ICD-10-CM | POA: Diagnosis not present

## 2023-12-26 NOTE — Therapy (Signed)
 OUTPATIENT PHYSICAL THERAPY TREATMENT/RECERTIFICATION   Patient Name: Marissa Garcia MRN: [id number] DOB:[birth year], 58 y.o., female Today's Date: 12/26/2023  END OF SESSION:   PT End of Session - 12/26/23 1355     Visit Number 12    Number of Visits 16    Date for PT Re-Evaluation 01/23/24    Authorization Type [employer]    Authorization Time Period additional auth req'd    Authorization - Visit Number --    Authorization - Number of Visits --    PT Start Time 1347    PT Stop Time 1425    PT Time Calculation (min) 38 min    Activity Tolerance Patient tolerated treatment well    Behavior During Therapy [facility] for tasks assessed/performed            Past Medical History:  Diagnosis Date   Burst fracture of T12 vertebra (HCC) 10/13/2018   Complication of anesthesia    Constipation    Headache    Hypercholesteremia    Parkinson's disease (HCC)    Past Surgical History:  Procedure Laterality Date   CESAREAN SECTION     Patient Active Problem List   Diagnosis Date Noted   Lumbar pain 03/22/2022   Impingement syndrome of right shoulder 08/05/2019   Chronic neck pain 08/05/2019   Sprain of right rotator cuff capsule 07/14/2019   Body mass index (BMI) 27.0-27.9, adult 07/14/2019   Nutritional counseling 07/13/2019   Parkinson's disease (HCC) 04/22/2019   Lumbar radiculopathy 04/22/2019   Burst fracture of thoracic vertebra (HCC) 10/15/2018   Atrophic vulvovaginitis 01/25/2016   Perimenopausal 01/25/2016   Dental caries 07/05/2010   Headache 03/21/2010   Anxiety state 02/27/2010   ACID REFLUX DISEASE 02/27/2010   WRIST PAIN, RIGHT 02/27/2010   DIZZINESS 02/27/2010   CHEST PAIN 02/15/2010   HYPERCHOLESTEROLEMIA 05/13/2008    PCP: Theotis Haze ORN, NP  REFERRING PROVIDER: Delbert Clam, MD  REFERRING DIAG:  M75.41 (ICD-10-CM) - Impingement syndrome of right shoulder  M54.6,G89.29 (ICD-10-CM) - Chronic midline thoracic back pain    THERAPY DIAG:  Chronic  right shoulder pain  Muscle weakness (generalized)  Abnormal posture  Pain in thoracic spine   Rationale for Evaluation and Treatment: Rehabilitation  ONSET DATE: 10/13/2018  SUBJECTIVE:                                                                                                                                                                                     Per eval: Patient presents to PT today d/t R shoulder pain and upper back pain that has been present since her fall in 2020. She states that her symptoms started after a fall,  but they were just focused on my back at the time, so they didn't really deal with my arm. She reports that it has been getting worse of the past few months. She would like to know what type of exercises that she can do at home. She has most difficulty with raising her R arm and reaching her back.  Patient verbalizes understanding that she can utilize interpretation services, however she states that she does not need that. She does prefer to receive written information in Spanish.  Hand dominance: Right  SUBJECTIVE STATEMENT: 12/26/23: Patient reports that her pain is improved after PT sessions, but it doesn't last >24 hours. She feels that her ROM and strength has not made much improvement. She would like to continue with PT, but also plans to get an MRI to find out if anything is happening with her muscle.    PERTINENT HISTORY: Relevant PMHx includes T12 burst fracture, HA, Parkinson's Disease, Chronic Neck Pain, Sprain of Right Rotator Cuff Capsule, R Wrist Pain  PAIN:  Are you having pain? 8/10 R shoulder blade, no shoulder pain  Per eval: Yes: NPRS scale: 10/10 Pain location: R shoulder and BIL TS, occasionally radiates to R side of neck Pain description: burning, R hand paresthesia. Aggravating factors: lifting arm  Relieving factors: topical cream  PRECAUTIONS: None  RED FLAGS: None   WEIGHT BEARING RESTRICTIONS: No  FALLS:  Has  patient fallen in last 6 months? No  LIVING ENVIRONMENT: Lives with: lives with their family and lives with their daughter Lives in: House/apartment Stairs: Yes: Internal: 1 flight steps; not using d/t bedroom on first floor  Has following equipment at home: None  OCCUPATION: Cleaning houses  PLOF: Independent  PATIENT GOALS:To have less pain, be able to lift her arm, wash her back and perform work activities   NEXT MD VISIT: 11/11/2023 Neurology Appt  OBJECTIVE:  Note: Objective measures were completed at Evaluation unless otherwise noted.  DIAGNOSTIC FINDINGS:  10/13/2023: R SHOULDER XRAY FINDINGS: There is no evidence of fracture or dislocation. There is no evidence of arthropathy or other focal bone abnormality. Soft tissues are unremarkable.   IMPRESSION: Negative.  10/13/2023: CHEST XRAY FINDINGS: The heart size and mediastinal contours are within normal limits. Both lungs are clear. The visualized skeletal structures are unremarkable.   IMPRESSION: No active cardiopulmonary disease.   PATIENT SURVEYS:  Quick DASH: 56.8  12/26/2023: 70.45  COGNITION: Overall cognitive status: Within functional limits for tasks assessed     SENSATION: Not tested  POSTURE: Rounded Shoulders  UPPER EXTREMITY ROM:   Active ROM Right eval Left eval R 11/18/23 Right 12/24/23  Shoulder flexion A 84 (P 110) WFL A: 114 deg * A: 120*  Shoulder extension      Shoulder abduction A 80 (P 85) WFL    Shoulder adduction      Shoulder internal rotation Functional reach to L3     Shoulder external rotation      Elbow flexion      Elbow extension      Wrist flexion      Wrist extension      Wrist ulnar deviation      Wrist radial deviation      Wrist pronation      Wrist supination      (Blank rows = not tested)  UPPER EXTREMITY MMT:  MMT Right eval Left eval Right 12/26/2023  Left 12/26/2023   Shoulder flexion 4- 4+ 4 4+  Shoulder extension  Shoulder abduction 4 4+ 44  4+  Shoulder adduction      Shoulder internal rotation 4 4 4 4   Shoulder external rotation 2+ 4- 3- 4  Middle trapezius      Lower trapezius      Elbow flexion      Elbow extension      Wrist flexion      Wrist extension      Wrist ulnar deviation      Wrist radial deviation      Wrist pronation      Wrist supination      Grip strength (lbs)      (Blank rows = not tested)  SHOULDER SPECIAL TESTS: Impingement tests: Neer impingement test: positive  and Hawkins/Kennedy impingement test: positive  Positive result, subsequent to intolerance of testing positions d/t painful passive and active shoulder elevation AROM    Mild symptom relief following gentle GH distraction and Grade II lateral mobilization   PALPATION:  Pain with palpation throughout TS CPA; painfree throughout LS CPA Pain with palpation along medial border of BIL scapula, and BIL parascapular mm.                                                                                                                              TREATMENT DATE:   [facility] Adult PT Treatment:                                                DATE: 12/26/2023   Therapeutic Activity:  Reassessment of objective measures and subjective assessment regarding progress towards established goals and updated plan for addressing remaining deficits and rehab goals.   Subjective assessment concurrent with:  Seated Pulley x 2 min flexion/scaption each STM and myofascial trigger point release for L UT, scalene, levator scap SO release Standing shoulder rows RTB 2 x 10  Standing shoulder extension RTB 2 x 10    [facility] Adult PT Treatment:                                                DATE: 12/24/23 Therapeutic Exercise: Seated Pulley x 3 min flexion/scaption Supine shoulder flexion with dowel x5 (painful) Supine chest press with dowel 2x10 Manual Therapy: STM and myofascial trigger point release for L UT, scalene, levator scap So release Positional release  Rt upper trap Neuromuscular re-ed: Standing shoulder rows RTB 2 x 10  Standing shoulder extension RTB 2 x 10 Supine Rt shoulder isometric from 45 degrees of scaption    [facility] Adult PT Treatment:  DATE: 12/16/23 Therapeutic Exercise: Seated Pulley x 3 min flexion  Supine shoulder flexion with dowel 2x10 Supine chest press with dowel 2x10 Supine AAROM shoulder flexion RUE x1 (increased pain) Manual Therapy: STM and myofascial trigger point release for L UT, scalene, levator scap So release Positional release Rt upper trap Neuromuscular re-ed: Supine L shoulder isometric from 45 degrees of scaption  Standing shoulder rows RTB 2 x 10  Standing shoulder extension RTB 2 x 10      PATIENT EDUCATION: Education details: rationale for interventions, HEP  Person educated: Patient Education method: Explanation, Demonstration, Tactile cues, Verbal cues Education comprehension: verbalized understanding, returned demonstration, verbal cues required, tactile cues required, and needs further education     HOME EXERCISE PROGRAM: Access Code: [email or web address] URL: [email or web address] Date: 11/20/2023 Prepared by: Alm Jenny  Exercises - Seated Shoulder Shrugs  - 1 x daily - 7 x weekly - 2 sets - 10 reps - 3 sec hold - Seated Bilateral Shoulder Flexion Towel Slide at Table Top  - 1 x daily - 7 x weekly - 2 sets - 10 reps - 3 sec hold - Seated Shoulder Abduction Towel Slide at Table Top with Forearm in Neutral  - 1 x daily - 7 x weekly - 2 sets - 10 reps - Standing Shoulder Row with Anchored Resistance  - 1 x daily - 7 x weekly - 2-3 sets - 8 reps  ASSESSMENT:  CLINICAL IMPRESSION: Patient has attended 12 PT sessions to address R shoulder and thoracic pain with associated mobility deficits. She is demonstrating good improvement of shoulder flexion AROM by about 40 degrees, and gradual improvement of UE MMT. She continues to have difficulty  with internal rotation AROM, MMT and functional reaching. She has related difficulty with upper body bathing/dressing and repetitive UE activities, including household cleaning. She requires ongoing skilled PT intervention in order to address remaining deficits and progress towards functional rehab goals. Patient will benefit from ongoing PT services, with decreased frequency from 2x/week (next week) to 1x/week for 6 weeks.     Per eval: Marissa Garcia is a 58 y.o. female who was seen today for physical therapy evaluation and treatment for persistent R shoulder and Thoracic pain. She is demonstrating decreased R shoulder AROM, inability to tolerate testing position for shoulder impingement tests, and pain with palpation along BIL TS spine. She has related pain and difficulty with overhead reaching, lifting, upper body dressing, bathing, household management and occupational duties. She does respond well to gentle GH distraction and Grade II lateral mobilization, reporting mild pain relief.  She requires skilled PT services at this time to address relevant deficits and improve overall function.     OBJECTIVE IMPAIRMENTS: decreased activity tolerance, decreased ROM, decreased strength, impaired UE functional use, postural dysfunction, and pain.   ACTIVITY LIMITATIONS: carrying, lifting, sleeping, bathing, and dressing  PARTICIPATION LIMITATIONS: meal prep, cleaning, laundry, interpersonal relationship, shopping, and occupation  PERSONAL FACTORS: Profession, Time since onset of injury/illness/exacerbation, and 3+ comorbidities: Relevant PMHx includes T12 burst fracture, HA, Parkinson's Disease, Chronic Neck Pain, Sprain of Right Rotator Cuff Capsule, R Wrist Pain are also affecting patient's functional outcome.   REHAB POTENTIAL: Fair    CLINICAL DECISION MAKING: Evolving/moderate complexity  EVALUATION COMPLEXITY: Moderate   GOALS: Goals reviewed with patient? YES  SHORT TERM GOALS: Target date:  12/08/2023   Patient will be independent with initial home program at least 3 days/week.  Baseline: provided at eval Goal Status: MET Pt reports adherence 12/02/23   2.  Patient will demonstrate improved postural awareness for at least 15 minutes while seated without need for cueing from PT.  Baseline: see objective measures Goal Status: Ongoing   3.  Patient will demonstrate improved R Shoulder elevation AROM to at least 145 degrees.  Baseline: 84 flexion, 80 abduction  Goal status: Progressing 12/24/23: 120   LONG TERM GOALS: Target date: 02/07/2024   Patient will report improved overall functional ability with QuickDASH score of 40 or less.  Baseline: 56 12/26/2023: 70.45 Goal Status: ONGOING  2.  Patient demonstrate improved functional IR reach to at least T12 in order to improved ability to wash her back, without exacerbation of symptoms.  Baseline: L3 12/26/2023: L3 Goal status: ONGOING   3.  Patient will demonstrate ability to perform overhead lifting of at least 10# using appropriate body mechanics and with no more than minimal pain in order to safely perform normal daily/occupational tasks.   Baseline: unable to lift higher than chest height  12/26/23: only able to lift from the floor  Goal Status: ONGOING   4.  Patient will report ability to perform normal occupational duties, with no more than 4/10 pain at end of most work days.   Baseline: 10/10 pain   12/26/2023: patient replying more on CL UE for cleaning, but has moderate-to-severe pain using R arm for reaching, lifting (waist level, counter height, overhead), reaching behind her back.    Goal Status: ONGOING    PLAN:  PT FREQUENCY: 2x/week for 1 weeks, 1x/week for 4 weeks   PT DURATION: other: 5 weeks  PLANNED INTERVENTIONS: 97164- PT Re-evaluation, 97110-Therapeutic exercises, 97530- Therapeutic activity, 97112- Neuromuscular re-education, 97535- Self Care, [id number]- Manual therapy, [id number]- Electrical  stimulation (unattended), Patient/Family education, Taping, Joint mobilization, Spinal mobilization, Cryotherapy, and Moist heat   [employer] Authorization   Choose one: Rehabilitative  Standardized Assessment or Functional Outcome Tool: See Pain Assessment and Quick DASH  Score or Percent Disability: 56.8  12/26/23: 70.45  Body Parts Treated (Select each separately):  Shoulder. Overall deficits/functional limitations for body part selected: moderate Cervicothoracic. Overall deficits/functional limitations for body part selected: moderate    PLAN FOR NEXT SESSION: address periscapular/postural mm endurance, address TS and R shoulder mobility, manual therapy and modalities as indicated, otherwise progress towards established rehab goals.    Marko Molt, PT, DPT  12/27/2023 8:34 AM

## 2023-12-30 ENCOUNTER — Ambulatory Visit

## 2023-12-30 DIAGNOSIS — M6281 Muscle weakness (generalized): Secondary | ICD-10-CM | POA: Diagnosis not present

## 2023-12-30 DIAGNOSIS — R293 Abnormal posture: Secondary | ICD-10-CM

## 2023-12-30 DIAGNOSIS — M546 Pain in thoracic spine: Secondary | ICD-10-CM

## 2023-12-30 DIAGNOSIS — G8929 Other chronic pain: Secondary | ICD-10-CM | POA: Diagnosis not present

## 2023-12-30 DIAGNOSIS — M25511 Pain in right shoulder: Secondary | ICD-10-CM | POA: Diagnosis not present

## 2023-12-30 NOTE — Therapy (Signed)
 OUTPATIENT PHYSICAL THERAPY TREATMENT   Patient Name: Marissa Garcia MRN: [id number] DOB:October 16, 1965, 58 y.o., female Today's Date: 12/30/2023  END OF SESSION:   PT End of Session - 12/30/23 1527     Visit Number 13    Number of Visits 16    Date for PT Re-Evaluation 01/23/24    Authorization Type [employer]    Authorization Time Period additional auth req'd    PT Start Time 1530    PT Stop Time 1601    PT Time Calculation (min) 31 min    Activity Tolerance Patient tolerated treatment well;Patient limited by pain    Behavior During Therapy [facility] for tasks assessed/performed         Past Medical History:  Diagnosis Date   Burst fracture of T12 vertebra (HCC) 10/13/2018   Complication of anesthesia    Constipation    Headache    Hypercholesteremia    Parkinson's disease (HCC)    Past Surgical History:  Procedure Laterality Date   CESAREAN SECTION     Patient Active Problem List   Diagnosis Date Noted   Lumbar pain 03/22/2022   Impingement syndrome of right shoulder 08/05/2019   Chronic neck pain 08/05/2019   Sprain of right rotator cuff capsule 07/14/2019   Body mass index (BMI) 27.0-27.9, adult 07/14/2019   Nutritional counseling 07/13/2019   Parkinson's disease (HCC) 04/22/2019   Lumbar radiculopathy 04/22/2019   Burst fracture of thoracic vertebra (HCC) 10/15/2018   Atrophic vulvovaginitis 01/25/2016   Perimenopausal 01/25/2016   Dental caries 07/05/2010   Headache 03/21/2010   Anxiety state 02/27/2010   ACID REFLUX DISEASE 02/27/2010   WRIST PAIN, RIGHT 02/27/2010   DIZZINESS 02/27/2010   CHEST PAIN 02/15/2010   HYPERCHOLESTEROLEMIA 05/13/2008    PCP: Theotis Haze ORN, NP  REFERRING PROVIDER: Delbert Clam, MD  REFERRING DIAG:  M75.41 (ICD-10-CM) - Impingement syndrome of right shoulder  M54.6,G89.29 (ICD-10-CM) - Chronic midline thoracic back pain    THERAPY DIAG:  Chronic right shoulder pain  Muscle weakness (generalized)  Abnormal  posture  Pain in thoracic spine   Rationale for Evaluation and Treatment: Rehabilitation  ONSET DATE: 10/13/2018  SUBJECTIVE:                                                                                                                                                                                     Per eval: Patient presents to PT today d/t R shoulder pain and upper back pain that has been present since her fall in 2020. She states that her symptoms started after a fall, but they were just focused on my back at the time, so they didn't really deal with  my arm. She reports that it has been getting worse of the past few months. She would like to know what type of exercises that she can do at home. She has most difficulty with raising her R arm and reaching her back.  Patient verbalizes understanding that she can utilize interpretation services, however she states that she does not need that. She does prefer to receive written information in Spanish.  Hand dominance: Right  SUBJECTIVE STATEMENT: Patient reports that her pain is not as bad today as it was last session, but that It continues, and it interferes with her sleep.    PERTINENT HISTORY: Relevant PMHx includes T12 burst fracture, HA, Parkinson's Disease, Chronic Neck Pain, Sprain of Right Rotator Cuff Capsule, R Wrist Pain  PAIN:  Are you having pain? 8/10 R shoulder blade, no shoulder pain  Per eval: Yes: NPRS scale: 10/10 Pain location: R shoulder and BIL TS, occasionally radiates to R side of neck Pain description: burning, R hand paresthesia. Aggravating factors: lifting arm  Relieving factors: topical cream  PRECAUTIONS: None  RED FLAGS: None   WEIGHT BEARING RESTRICTIONS: No  FALLS:  Has patient fallen in last 6 months? No  LIVING ENVIRONMENT: Lives with: lives with their family and lives with their daughter Lives in: House/apartment Stairs: Yes: Internal: 1 flight steps; not using d/t bedroom on first  floor  Has following equipment at home: None  OCCUPATION: Cleaning houses  PLOF: Independent  PATIENT GOALS:To have less pain, be able to lift her arm, wash her back and perform work activities   NEXT MD VISIT: 11/11/2023 Neurology Appt  OBJECTIVE:  Note: Objective measures were completed at Evaluation unless otherwise noted.  DIAGNOSTIC FINDINGS:  10/13/2023: R SHOULDER XRAY FINDINGS: There is no evidence of fracture or dislocation. There is no evidence of arthropathy or other focal bone abnormality. Soft tissues are unremarkable.   IMPRESSION: Negative.  10/13/2023: CHEST XRAY FINDINGS: The heart size and mediastinal contours are within normal limits. Both lungs are clear. The visualized skeletal structures are unremarkable.   IMPRESSION: No active cardiopulmonary disease.   PATIENT SURVEYS:  Quick DASH: 56.8  12/26/2023: 70.45  COGNITION: Overall cognitive status: Within functional limits for tasks assessed     SENSATION: Not tested  POSTURE: Rounded Shoulders  UPPER EXTREMITY ROM:   Active ROM Right eval Left eval R 11/18/23 Right 12/24/23  Shoulder flexion A 84 (P 110) WFL A: 114 deg * A: 120*  Shoulder extension      Shoulder abduction A 80 (P 85) WFL    Shoulder adduction      Shoulder internal rotation Functional reach to L3     Shoulder external rotation      Elbow flexion      Elbow extension      Wrist flexion      Wrist extension      Wrist ulnar deviation      Wrist radial deviation      Wrist pronation      Wrist supination      (Blank rows = not tested)  UPPER EXTREMITY MMT:  MMT Right eval Left eval Right 12/26/2023  Left 12/26/2023   Shoulder flexion 4- 4+ 4 4+  Shoulder extension      Shoulder abduction 4 4+ 44 4+  Shoulder adduction      Shoulder internal rotation 4 4 4 4   Shoulder external rotation 2+ 4- 3- 4  Middle trapezius      Lower trapezius  Elbow flexion      Elbow extension      Wrist flexion      Wrist  extension      Wrist ulnar deviation      Wrist radial deviation      Wrist pronation      Wrist supination      Grip strength (lbs)      (Blank rows = not tested)  SHOULDER SPECIAL TESTS: Impingement tests: Neer impingement test: positive  and Hawkins/Kennedy impingement test: positive  Positive result, subsequent to intolerance of testing positions d/t painful passive and active shoulder elevation AROM    Mild symptom relief following gentle GH distraction and Grade II lateral mobilization   PALPATION:  Pain with palpation throughout TS CPA; painfree throughout LS CPA Pain with palpation along medial border of BIL scapula, and BIL parascapular mm.                                                                                                                              TREATMENT DATE:  [facility] Adult PT Treatment:                                                DATE: 12/30/23 Therapeutic Exercise: Seated Pulley x 2 min flexion/scaption each Supine shoulder flexion with dowel x5 (painful) Supine chest press with dowel 2x10 Manual Therapy: STM and myofascial trigger point release for L UT, scalene, levator scap So release Positional release Rt upper trap Neuromuscular re-ed: Standing shoulder rows RTB 2 x 10  Standing shoulder extension RTB 2 x 10 Seated BIL ER with scap retraction x10 YTB Sidelying trio ER x10, flex x3, abdx2 Seated horizontal abduction YTB x10  [facility] Adult PT Treatment:                                                DATE: 12/26/2023   Therapeutic Activity:  Reassessment of objective measures and subjective assessment regarding progress towards established goals and updated plan for addressing remaining deficits and rehab goals.   Subjective assessment concurrent with:  Seated Pulley x 2 min flexion/scaption each STM and myofascial trigger point release for L UT, scalene, levator scap SO release Standing shoulder rows RTB 2 x 10  Standing shoulder extension  RTB 2 x 10    [facility] Adult PT Treatment:                                                DATE: 12/24/23 Therapeutic Exercise: Seated Pulley x 3 min flexion/scaption Supine shoulder flexion  with dowel x5 (painful) Supine chest press with dowel 2x10 Manual Therapy: STM and myofascial trigger point release for L UT, scalene, levator scap So release Positional release Rt upper trap Neuromuscular re-ed: Standing shoulder rows RTB 2 x 10  Standing shoulder extension RTB 2 x 10 Supine Rt shoulder isometric from 45 degrees of scaption     PATIENT EDUCATION: Education details: rationale for interventions, HEP  Person educated: Patient Education method: Explanation, Demonstration, Tactile cues, Verbal cues Education comprehension: verbalized understanding, returned demonstration, verbal cues required, tactile cues required, and needs further education     HOME EXERCISE PROGRAM: Access Code: [email or web address] URL: [email or web address] Date: 11/20/2023 Prepared by: Alm Jenny  Exercises - Seated Shoulder Shrugs  - 1 x daily - 7 x weekly - 2 sets - 10 reps - 3 sec hold - Seated Bilateral Shoulder Flexion Towel Slide at Table Top  - 1 x daily - 7 x weekly - 2 sets - 10 reps - 3 sec hold - Seated Shoulder Abduction Towel Slide at Table Top with Forearm in Neutral  - 1 x daily - 7 x weekly - 2 sets - 10 reps - Standing Shoulder Row with Anchored Resistance  - 1 x daily - 7 x weekly - 2-3 sets - 8 reps  ASSESSMENT:  CLINICAL IMPRESSION: Patient presents to PT reporting improvements in her pain from previous session, but continued pain that interferes especially with her sleep. Session today continued to focus on shoulder and periscapular strengthening within her pain tolerance. She continues to note significant decrease in tension from manual techniques. Patient continues to benefit from skilled PT services and should be progressed as able to improve functional independence.   Per eval:  Marissa Garcia is a 58 y.o. female who was seen today for physical therapy evaluation and treatment for persistent R shoulder and Thoracic pain. She is demonstrating decreased R shoulder AROM, inability to tolerate testing position for shoulder impingement tests, and pain with palpation along BIL TS spine. She has related pain and difficulty with overhead reaching, lifting, upper body dressing, bathing, household management and occupational duties. She does respond well to gentle GH distraction and Grade II lateral mobilization, reporting mild pain relief.  She requires skilled PT services at this time to address relevant deficits and improve overall function.     OBJECTIVE IMPAIRMENTS: decreased activity tolerance, decreased ROM, decreased strength, impaired UE functional use, postural dysfunction, and pain.   ACTIVITY LIMITATIONS: carrying, lifting, sleeping, bathing, and dressing  PARTICIPATION LIMITATIONS: meal prep, cleaning, laundry, interpersonal relationship, shopping, and occupation  PERSONAL FACTORS: Profession, Time since onset of injury/illness/exacerbation, and 3+ comorbidities: Relevant PMHx includes T12 burst fracture, HA, Parkinson's Disease, Chronic Neck Pain, Sprain of Right Rotator Cuff Capsule, R Wrist Pain are also affecting patient's functional outcome.   REHAB POTENTIAL: Fair    CLINICAL DECISION MAKING: Evolving/moderate complexity  EVALUATION COMPLEXITY: Moderate   GOALS: Goals reviewed with patient? YES  SHORT TERM GOALS: Target date: 12/08/2023   Patient will be independent with initial home program at least 3 days/week.  Baseline: provided at eval Goal Status: MET Pt reports adherence 12/02/23   2.  Patient will demonstrate improved postural awareness for at least 15 minutes while seated without need for cueing from PT.  Baseline: see objective measures Goal Status: Ongoing   3.  Patient will demonstrate improved R Shoulder elevation AROM to at least 145 degrees.   Baseline: 84 flexion, 80 abduction  Goal status: Progressing 12/24/23: 120   LONG TERM GOALS: Target  date: 02/07/2024   Patient will report improved overall functional ability with QuickDASH score of 40 or less.  Baseline: 56 12/30/2023: 70.45 Goal Status: ONGOING  2.  Patient demonstrate improved functional IR reach to at least T12 in order to improved ability to wash her back, without exacerbation of symptoms.  Baseline: L3 12/26/2023: L3 Goal status: ONGOING   3.  Patient will demonstrate ability to perform overhead lifting of at least 10# using appropriate body mechanics and with no more than minimal pain in order to safely perform normal daily/occupational tasks.   Baseline: unable to lift higher than chest height  12/26/23: only able to lift from the floor  Goal Status: ONGOING   4.  Patient will report ability to perform normal occupational duties, with no more than 4/10 pain at end of most work days.   Baseline: 10/10 pain   12/26/2023: patient replying more on CL UE for cleaning, but has moderate-to-severe pain using R arm for reaching, lifting (waist level, counter height, overhead), reaching behind her back.    Goal Status: ONGOING    PLAN:  PT FREQUENCY: 2x/week for 1 weeks, 1x/week for 4 weeks   PT DURATION: other: 5 weeks  PLANNED INTERVENTIONS: 97164- PT Re-evaluation, 97110-Therapeutic exercises, 97530- Therapeutic activity, 97112- Neuromuscular re-education, 97535- Self Care, [id number]- Manual therapy, [id number]- Electrical stimulation (unattended), Patient/Family education, Taping, Joint mobilization, Spinal mobilization, Cryotherapy, and Moist heat   [employer] Authorization   Choose one: Rehabilitative  Standardized Assessment or Functional Outcome Tool: See Pain Assessment and Quick DASH  Score or Percent Disability: 56.8  12/26/23: 70.45  Body Parts Treated (Select each separately):  Shoulder. Overall deficits/functional limitations for body part selected:  moderate Cervicothoracic. Overall deficits/functional limitations for body part selected: moderate    PLAN FOR NEXT SESSION: address periscapular/postural mm endurance, address TS and R shoulder mobility, manual therapy and modalities as indicated, otherwise progress towards established rehab goals.    Corean Pouch PTA  12/30/2023 4:12 PM

## 2024-01-01 ENCOUNTER — Other Ambulatory Visit: Payer: Self-pay

## 2024-01-01 ENCOUNTER — Ambulatory Visit

## 2024-01-03 ENCOUNTER — Ambulatory Visit: Payer: Self-pay

## 2024-01-03 DIAGNOSIS — R293 Abnormal posture: Secondary | ICD-10-CM

## 2024-01-03 DIAGNOSIS — M25511 Pain in right shoulder: Secondary | ICD-10-CM | POA: Diagnosis not present

## 2024-01-03 DIAGNOSIS — M546 Pain in thoracic spine: Secondary | ICD-10-CM | POA: Diagnosis not present

## 2024-01-03 DIAGNOSIS — M6281 Muscle weakness (generalized): Secondary | ICD-10-CM | POA: Diagnosis not present

## 2024-01-03 DIAGNOSIS — G8929 Other chronic pain: Secondary | ICD-10-CM

## 2024-01-03 NOTE — Therapy (Signed)
 OUTPATIENT PHYSICAL THERAPY TREATMENT   Patient Name: Nitzia Perren MRN: [id number] DOB:09-19-1965, 58 y.o., female Today's Date: 01/03/2024  END OF SESSION:   PT End of Session - 01/03/24 0930     Visit Number 14    Date for PT Re-Evaluation 01/23/24    Authorization Type [employer]    Authorization Time Period additional auth req'd    PT Start Time 0945    PT Stop Time 1025    PT Time Calculation (min) 40 min    Activity Tolerance Patient tolerated treatment well;Patient limited by pain          Past Medical History:  Diagnosis Date   Burst fracture of T12 vertebra (HCC) 10/13/2018   Complication of anesthesia    Constipation    Headache    Hypercholesteremia    Parkinson's disease (HCC)    Past Surgical History:  Procedure Laterality Date   CESAREAN SECTION     Patient Active Problem List   Diagnosis Date Noted   Lumbar pain 03/22/2022   Impingement syndrome of right shoulder 08/05/2019   Chronic neck pain 08/05/2019   Sprain of right rotator cuff capsule 07/14/2019   Body mass index (BMI) 27.0-27.9, adult 07/14/2019   Nutritional counseling 07/13/2019   Parkinson's disease (HCC) 04/22/2019   Lumbar radiculopathy 04/22/2019   Burst fracture of thoracic vertebra (HCC) 10/15/2018   Atrophic vulvovaginitis 01/25/2016   Perimenopausal 01/25/2016   Dental caries 07/05/2010   Headache 03/21/2010   Anxiety state 02/27/2010   ACID REFLUX DISEASE 02/27/2010   WRIST PAIN, RIGHT 02/27/2010   DIZZINESS 02/27/2010   CHEST PAIN 02/15/2010   HYPERCHOLESTEROLEMIA 05/13/2008    PCP: Theotis Haze ORN, NP  REFERRING PROVIDER: Delbert Clam, MD  REFERRING DIAG:  M75.41 (ICD-10-CM) - Impingement syndrome of right shoulder  M54.6,G89.29 (ICD-10-CM) - Chronic midline thoracic back pain    THERAPY DIAG:  Chronic right shoulder pain  Muscle weakness (generalized)  Abnormal posture  Pain in thoracic spine   Rationale for Evaluation and Treatment:  Rehabilitation  ONSET DATE: 10/13/2018  SUBJECTIVE:                                                                                                                                                                                     Per eval: Patient presents to PT today d/t R shoulder pain and upper back pain that has been present since her fall in 2020. She states that her symptoms started after a fall, but they were just focused on my back at the time, so they didn't really deal with my arm. She reports that it has been getting worse of the past few months. She  would like to know what type of exercises that she can do at home. She has most difficulty with raising her R arm and reaching her back.  Patient verbalizes understanding that she can utilize interpretation services, however she states that she does not need that. She does prefer to receive written information in Spanish.  Hand dominance: Right  SUBJECTIVE STATEMENT: Patient reports that her pain isn't too bad today.   PERTINENT HISTORY: Relevant PMHx includes T12 burst fracture, HA, Parkinson's Disease, Chronic Neck Pain, Sprain of Right Rotator Cuff Capsule, R Wrist Pain  PAIN:  Are you having pain? 8/10 R shoulder blade, no shoulder pain  Per eval: Yes: NPRS scale: 10/10 Pain location: R shoulder and BIL TS, occasionally radiates to R side of neck Pain description: burning, R hand paresthesia. Aggravating factors: lifting arm  Relieving factors: topical cream  PRECAUTIONS: None  RED FLAGS: None   WEIGHT BEARING RESTRICTIONS: No  FALLS:  Has patient fallen in last 6 months? No  LIVING ENVIRONMENT: Lives with: lives with their family and lives with their daughter Lives in: House/apartment Stairs: Yes: Internal: 1 flight steps; not using d/t bedroom on first floor  Has following equipment at home: None  OCCUPATION: Cleaning houses  PLOF: Independent  PATIENT GOALS:To have less pain, be able to lift her arm, wash  her back and perform work activities   NEXT MD VISIT: 11/11/2023 Neurology Appt  OBJECTIVE:  Note: Objective measures were completed at Evaluation unless otherwise noted.  DIAGNOSTIC FINDINGS:  10/13/2023: R SHOULDER XRAY FINDINGS: There is no evidence of fracture or dislocation. There is no evidence of arthropathy or other focal bone abnormality. Soft tissues are unremarkable.   IMPRESSION: Negative.  10/13/2023: CHEST XRAY FINDINGS: The heart size and mediastinal contours are within normal limits. Both lungs are clear. The visualized skeletal structures are unremarkable.   IMPRESSION: No active cardiopulmonary disease.   PATIENT SURVEYS:  Quick DASH: 56.8  12/26/2023: 70.45  COGNITION: Overall cognitive status: Within functional limits for tasks assessed     SENSATION: Not tested  POSTURE: Rounded Shoulders  UPPER EXTREMITY ROM:   Active ROM Right eval Left eval R 11/18/23 Right 12/24/23  Shoulder flexion A 84 (P 110) WFL A: 114 deg * A: 120*  Shoulder extension      Shoulder abduction A 80 (P 85) WFL    Shoulder adduction      Shoulder internal rotation Functional reach to L3     Shoulder external rotation      Elbow flexion      Elbow extension      Wrist flexion      Wrist extension      Wrist ulnar deviation      Wrist radial deviation      Wrist pronation      Wrist supination      (Blank rows = not tested)  UPPER EXTREMITY MMT:  MMT Right eval Left eval Right 12/26/2023  Left 12/26/2023   Shoulder flexion 4- 4+ 4 4+  Shoulder extension      Shoulder abduction 4 4+ 44 4+  Shoulder adduction      Shoulder internal rotation 4 4 4 4   Shoulder external rotation 2+ 4- 3- 4  Middle trapezius      Lower trapezius      Elbow flexion      Elbow extension      Wrist flexion      Wrist extension      Wrist ulnar deviation  Wrist radial deviation      Wrist pronation      Wrist supination      Grip strength (lbs)      (Blank rows = not  tested)  SHOULDER SPECIAL TESTS: Impingement tests: Neer impingement test: positive  and Hawkins/Kennedy impingement test: positive  Positive result, subsequent to intolerance of testing positions d/t painful passive and active shoulder elevation AROM    Mild symptom relief following gentle GH distraction and Grade II lateral mobilization   PALPATION:  Pain with palpation throughout TS CPA; painfree throughout LS CPA Pain with palpation along medial border of BIL scapula, and BIL parascapular mm.                                                                                                                              TREATMENT DATE:  [facility] Adult PT Treatment:                                                DATE: 01/03/24 Therapeutic Exercise: Seated Pulley x 3 min flexion/scaption each Supine shoulder flexion with dowel x1 (painful) Supine chest press with dowel 2x10 Manual Therapy: STM and myofascial trigger point release for L UT, scalene, levator scap So release Positional release Rt upper trap Neuromuscular re-ed: Standing shoulder rows RTB 2 x 10  Standing shoulder extension RTB 2 x 10 Seated BIL ER with scap retraction x10 YTB (difficult) Sidelying ER RUE 2x10  Prone rows RUE x10 (painful) Prone extension RUE x10 (painful) Seated horizontal abduction YTB 2x10  [facility] Adult PT Treatment:                                                DATE: 12/30/23 Therapeutic Exercise: Seated Pulley x 2 min flexion/scaption each Supine shoulder flexion with dowel x5 (painful) Supine chest press with dowel 2x10 Manual Therapy: STM and myofascial trigger point release for L UT, scalene, levator scap So release Positional release Rt upper trap Neuromuscular re-ed: Standing shoulder rows RTB 2 x 10  Standing shoulder extension RTB 2 x 10 Seated BIL ER with scap retraction x10 YTB Sidelying trio ER x10, flex x3, abdx2 Seated horizontal abduction YTB x10  [facility] Adult PT Treatment:                                                 DATE: 12/26/2023   Therapeutic Activity:  Reassessment of objective measures and subjective assessment regarding progress towards established goals and updated plan for addressing remaining deficits and rehab goals.   Subjective  assessment concurrent with:  Seated Pulley x 2 min flexion/scaption each STM and myofascial trigger point release for L UT, scalene, levator scap SO release Standing shoulder rows RTB 2 x 10  Standing shoulder extension RTB 2 x 10    PATIENT EDUCATION: Education details: rationale for interventions, HEP  Person educated: Patient Education method: Explanation, Demonstration, Tactile cues, Verbal cues Education comprehension: verbalized understanding, returned demonstration, verbal cues required, tactile cues required, and needs further education     HOME EXERCISE PROGRAM: Access Code: [email or web address] URL: [email or web address] Date: 11/20/2023 Prepared by: Alm Jenny  Exercises - Seated Shoulder Shrugs  - 1 x daily - 7 x weekly - 2 sets - 10 reps - 3 sec hold - Seated Bilateral Shoulder Flexion Towel Slide at Table Top  - 1 x daily - 7 x weekly - 2 sets - 10 reps - 3 sec hold - Seated Shoulder Abduction Towel Slide at Table Top with Forearm in Neutral  - 1 x daily - 7 x weekly - 2 sets - 10 reps - Standing Shoulder Row with Anchored Resistance  - 1 x daily - 7 x weekly - 2-3 sets - 8 reps  ASSESSMENT:  CLINICAL IMPRESSION: Patient presents to PT reporting mild pain in her shoulder today, slept ok but not great last night. Session today continued to focus on shoulder ROM and periscapular strengthening as well as the use of manual techniques to decrease tension and pain. She continues to report significant benefit from manual. Patient was able to tolerate all prescribed exercises with no adverse effects. Patient continues to benefit from skilled PT services and should be progressed as able to improve functional  independence.   Per eval: Lua is a 58 y.o. female who was seen today for physical therapy evaluation and treatment for persistent R shoulder and Thoracic pain. She is demonstrating decreased R shoulder AROM, inability to tolerate testing position for shoulder impingement tests, and pain with palpation along BIL TS spine. She has related pain and difficulty with overhead reaching, lifting, upper body dressing, bathing, household management and occupational duties. She does respond well to gentle GH distraction and Grade II lateral mobilization, reporting mild pain relief.  She requires skilled PT services at this time to address relevant deficits and improve overall function.     OBJECTIVE IMPAIRMENTS: decreased activity tolerance, decreased ROM, decreased strength, impaired UE functional use, postural dysfunction, and pain.   ACTIVITY LIMITATIONS: carrying, lifting, sleeping, bathing, and dressing  PARTICIPATION LIMITATIONS: meal prep, cleaning, laundry, interpersonal relationship, shopping, and occupation  PERSONAL FACTORS: Profession, Time since onset of injury/illness/exacerbation, and 3+ comorbidities: Relevant PMHx includes T12 burst fracture, HA, Parkinson's Disease, Chronic Neck Pain, Sprain of Right Rotator Cuff Capsule, R Wrist Pain are also affecting patient's functional outcome.   REHAB POTENTIAL: Fair    CLINICAL DECISION MAKING: Evolving/moderate complexity  EVALUATION COMPLEXITY: Moderate   GOALS: Goals reviewed with patient? YES  SHORT TERM GOALS: Target date: 12/08/2023   Patient will be independent with initial home program at least 3 days/week.  Baseline: provided at eval Goal Status: MET Pt reports adherence 12/02/23   2.  Patient will demonstrate improved postural awareness for at least 15 minutes while seated without need for cueing from PT.  Baseline: see objective measures Goal Status: Ongoing   3.  Patient will demonstrate improved R Shoulder elevation AROM  to at least 145 degrees.  Baseline: 84 flexion, 80 abduction  Goal status: Progressing 12/24/23: 120   LONG TERM GOALS: Target date: 02/07/2024  Patient will report improved overall functional ability with QuickDASH score of 40 or less.  Baseline: 56 01/03/2024: 70.45 Goal Status: ONGOING  2.  Patient demonstrate improved functional IR reach to at least T12 in order to improved ability to wash her back, without exacerbation of symptoms.  Baseline: L3 12/26/2023: L3 Goal status: ONGOING   3.  Patient will demonstrate ability to perform overhead lifting of at least 10# using appropriate body mechanics and with no more than minimal pain in order to safely perform normal daily/occupational tasks.   Baseline: unable to lift higher than chest height  12/26/23: only able to lift from the floor  Goal Status: ONGOING   4.  Patient will report ability to perform normal occupational duties, with no more than 4/10 pain at end of most work days.   Baseline: 10/10 pain   12/26/2023: patient replying more on CL UE for cleaning, but has moderate-to-severe pain using R arm for reaching, lifting (waist level, counter height, overhead), reaching behind her back.    Goal Status: ONGOING    PLAN:  PT FREQUENCY: 2x/week for 1 weeks, 1x/week for 4 weeks   PT DURATION: other: 5 weeks  PLANNED INTERVENTIONS: 97164- PT Re-evaluation, 97110-Therapeutic exercises, 97530- Therapeutic activity, 97112- Neuromuscular re-education, 97535- Self Care, [id number]- Manual therapy, [id number]- Electrical stimulation (unattended), Patient/Family education, Taping, Joint mobilization, Spinal mobilization, Cryotherapy, and Moist heat   [employer] Authorization   Choose one: Rehabilitative  Standardized Assessment or Functional Outcome Tool: See Pain Assessment and Quick DASH  Score or Percent Disability: 56.8  12/26/23: 70.45  Body Parts Treated (Select each separately):  Shoulder. Overall deficits/functional limitations  for body part selected: moderate Cervicothoracic. Overall deficits/functional limitations for body part selected: moderate    PLAN FOR NEXT SESSION: address periscapular/postural mm endurance, address TS and R shoulder mobility, manual therapy and modalities as indicated, otherwise progress towards established rehab goals.    Corean Pouch PTA  01/03/2024 10:29 AM

## 2024-01-05 ENCOUNTER — Other Ambulatory Visit: Payer: Self-pay

## 2024-01-06 ENCOUNTER — Ambulatory Visit

## 2024-01-06 DIAGNOSIS — R293 Abnormal posture: Secondary | ICD-10-CM | POA: Diagnosis not present

## 2024-01-06 DIAGNOSIS — M6281 Muscle weakness (generalized): Secondary | ICD-10-CM

## 2024-01-06 DIAGNOSIS — G8929 Other chronic pain: Secondary | ICD-10-CM

## 2024-01-06 DIAGNOSIS — M546 Pain in thoracic spine: Secondary | ICD-10-CM

## 2024-01-06 DIAGNOSIS — M25511 Pain in right shoulder: Secondary | ICD-10-CM | POA: Diagnosis not present

## 2024-01-06 NOTE — Therapy (Signed)
 OUTPATIENT PHYSICAL THERAPY TREATMENT   Patient Name: Marissa Garcia MRN: [id number] DOB:August 09, 1965, 58 y.o., female Today's Date: 01/06/2024  END OF SESSION:   PT End of Session - 01/06/24 1745     Visit Number 15    Number of Visits 16    Date for PT Re-Evaluation 01/23/24    Authorization Type [employer]    Authorization Time Period additional auth req'd    PT Start Time 1745    PT Stop Time 1825    PT Time Calculation (min) 40 min    Activity Tolerance Patient tolerated treatment well;Patient limited by pain    Behavior During Therapy [facility] for tasks assessed/performed          Past Medical History:  Diagnosis Date   Burst fracture of T12 vertebra (HCC) 10/13/2018   Complication of anesthesia    Constipation    Headache    Hypercholesteremia    Parkinson's disease (HCC)    Past Surgical History:  Procedure Laterality Date   CESAREAN SECTION     Patient Active Problem List   Diagnosis Date Noted   Lumbar pain 03/22/2022   Impingement syndrome of right shoulder 08/05/2019   Chronic neck pain 08/05/2019   Sprain of right rotator cuff capsule 07/14/2019   Body mass index (BMI) 27.0-27.9, adult 07/14/2019   Nutritional counseling 07/13/2019   Parkinson's disease (HCC) 04/22/2019   Lumbar radiculopathy 04/22/2019   Burst fracture of thoracic vertebra (HCC) 10/15/2018   Atrophic vulvovaginitis 01/25/2016   Perimenopausal 01/25/2016   Dental caries 07/05/2010   Headache 03/21/2010   Anxiety state 02/27/2010   ACID REFLUX DISEASE 02/27/2010   WRIST PAIN, RIGHT 02/27/2010   DIZZINESS 02/27/2010   CHEST PAIN 02/15/2010   HYPERCHOLESTEROLEMIA 05/13/2008    PCP: Theotis Haze ORN, NP  REFERRING PROVIDER: Delbert Clam, MD  REFERRING DIAG:  M75.41 (ICD-10-CM) - Impingement syndrome of right shoulder  M54.6,G89.29 (ICD-10-CM) - Chronic midline thoracic back pain    THERAPY DIAG:  Chronic right shoulder pain  Muscle weakness (generalized)  Abnormal  posture  Pain in thoracic spine   Rationale for Evaluation and Treatment: Rehabilitation  ONSET DATE: 10/13/2018  SUBJECTIVE:                                                                                                                                                                                     Per eval: Patient presents to PT today d/t R shoulder pain and upper back pain that has been present since her fall in 2020. She states that her symptoms started after a fall, but they were just focused on my back at the time, so they didn't really deal  with my arm. She reports that it has been getting worse of the past few months. She would like to know what type of exercises that she can do at home. She has most difficulty with raising her R arm and reaching her back.  Patient verbalizes understanding that she can utilize interpretation services, however she states that she does not need that. She does prefer to receive written information in Spanish.  Hand dominance: Right  SUBJECTIVE STATEMENT: Patient reports that her pain isn't too bad today.   PERTINENT HISTORY: Relevant PMHx includes T12 burst fracture, HA, Parkinson's Disease, Chronic Neck Pain, Sprain of Right Rotator Cuff Capsule, R Wrist Pain  PAIN:  Are you having pain? 8/10 R shoulder blade, no shoulder pain  Per eval: Yes: NPRS scale: 10/10 Pain location: R shoulder and BIL TS, occasionally radiates to R side of neck Pain description: burning, R hand paresthesia. Aggravating factors: lifting arm  Relieving factors: topical cream  PRECAUTIONS: None  RED FLAGS: None   WEIGHT BEARING RESTRICTIONS: No  FALLS:  Has patient fallen in last 6 months? No  LIVING ENVIRONMENT: Lives with: lives with their family and lives with their daughter Lives in: House/apartment Stairs: Yes: Internal: 1 flight steps; not using d/t bedroom on first floor  Has following equipment at home: None  OCCUPATION: Cleaning  houses  PLOF: Independent  PATIENT GOALS:To have less pain, be able to lift her arm, wash her back and perform work activities   NEXT MD VISIT: 11/11/2023 Neurology Appt  OBJECTIVE:  Note: Objective measures were completed at Evaluation unless otherwise noted.  DIAGNOSTIC FINDINGS:  10/13/2023: R SHOULDER XRAY FINDINGS: There is no evidence of fracture or dislocation. There is no evidence of arthropathy or other focal bone abnormality. Soft tissues are unremarkable.   IMPRESSION: Negative.  10/13/2023: CHEST XRAY FINDINGS: The heart size and mediastinal contours are within normal limits. Both lungs are clear. The visualized skeletal structures are unremarkable.   IMPRESSION: No active cardiopulmonary disease.   PATIENT SURVEYS:  Quick DASH: 56.8  12/26/2023: 70.45  COGNITION: Overall cognitive status: Within functional limits for tasks assessed     SENSATION: Not tested  POSTURE: Rounded Shoulders  UPPER EXTREMITY ROM:   Active ROM Right eval Left eval R 11/18/23 Right 12/24/23  Shoulder flexion A 84 (P 110) WFL A: 114 deg * A: 120*  Shoulder extension      Shoulder abduction A 80 (P 85) WFL    Shoulder adduction      Shoulder internal rotation Functional reach to L3     Shoulder external rotation      Elbow flexion      Elbow extension      Wrist flexion      Wrist extension      Wrist ulnar deviation      Wrist radial deviation      Wrist pronation      Wrist supination      (Blank rows = not tested)  UPPER EXTREMITY MMT:  MMT Right eval Left eval Right 12/26/2023  Left 12/26/2023   Shoulder flexion 4- 4+ 4 4+  Shoulder extension      Shoulder abduction 4 4+ 44 4+  Shoulder adduction      Shoulder internal rotation 4 4 4 4   Shoulder external rotation 2+ 4- 3- 4  Middle trapezius      Lower trapezius      Elbow flexion      Elbow extension      Wrist  flexion      Wrist extension      Wrist ulnar deviation      Wrist radial deviation       Wrist pronation      Wrist supination      Grip strength (lbs)      (Blank rows = not tested)  SHOULDER SPECIAL TESTS: Impingement tests: Neer impingement test: positive  and Hawkins/Kennedy impingement test: positive  Positive result, subsequent to intolerance of testing positions d/t painful passive and active shoulder elevation AROM    Mild symptom relief following gentle GH distraction and Grade II lateral mobilization   PALPATION:  Pain with palpation throughout TS CPA; painfree throughout LS CPA Pain with palpation along medial border of BIL scapula, and BIL parascapular mm.                                                                                                                              TREATMENT DATE:  [facility] Adult PT Treatment:                                                DATE: 01/06/24 Therapeutic Exercise: Seated Pulley x 3 min flexion/scaption each Supine chest press with dowel 2x10 Manual Therapy: STM and myofascial trigger point release for L UT, scalene, levator scap So release Positional release Rt upper trap Neuromuscular re-ed: Standing shoulder rows RTB 3 x 10 Sidelying ER RUE 2x10  Seated horizontal abduction YTB 2x10  [facility] Adult PT Treatment:                                                DATE: 01/03/24 Therapeutic Exercise: Seated Pulley x 3 min flexion/scaption each Supine shoulder flexion with dowel x1 (painful) Supine chest press with dowel 2x10 Manual Therapy: STM and myofascial trigger point release for L UT, scalene, levator scap So release Positional release Rt upper trap Neuromuscular re-ed: Standing shoulder rows RTB 2 x 10  Standing shoulder extension RTB 2 x 10 Seated BIL ER with scap retraction x10 YTB (difficult) Sidelying ER RUE 2x10  Prone rows RUE x10 (painful) Prone extension RUE x10 (painful) Seated horizontal abduction YTB 2x10  [facility] Adult PT Treatment:                                                DATE:  12/30/23 Therapeutic Exercise: Seated Pulley x 2 min flexion/scaption each Supine shoulder flexion with dowel x5 (painful) Supine chest press with dowel 2x10 Manual Therapy: STM and myofascial trigger point release for L UT, scalene,  levator scap So release Positional release Rt upper trap Neuromuscular re-ed: Standing shoulder rows RTB 2 x 10  Standing shoulder extension RTB 2 x 10 Seated BIL ER with scap retraction x10 YTB Sidelying trio ER x10, flex x3, abdx2 Seated horizontal abduction YTB x10     PATIENT EDUCATION: Education details: rationale for interventions, HEP  Person educated: Patient Education method: Explanation, Demonstration, Tactile cues, Verbal cues Education comprehension: verbalized understanding, returned demonstration, verbal cues required, tactile cues required, and needs further education     HOME EXERCISE PROGRAM: Access Code: [email or web address] URL: [email or web address] Date: 11/20/2023 Prepared by: Alm Jenny  Exercises - Seated Shoulder Shrugs  - 1 x daily - 7 x weekly - 2 sets - 10 reps - 3 sec hold - Seated Bilateral Shoulder Flexion Towel Slide at Table Top  - 1 x daily - 7 x weekly - 2 sets - 10 reps - 3 sec hold - Seated Shoulder Abduction Towel Slide at Table Top with Forearm in Neutral  - 1 x daily - 7 x weekly - 2 sets - 10 reps - Standing Shoulder Row with Anchored Resistance  - 1 x daily - 7 x weekly - 2-3 sets - 8 reps  ASSESSMENT:  CLINICAL IMPRESSION: Patient presents to PT reporting increased pain in her shoulder today and that she did not sleep well. Session today continued to focus on shoulder ROM, mobility, strengthening, and manual techniques to decrease tension. Patient was able to tolerate all prescribed exercises with no adverse effects. Patient continues to benefit from skilled PT services and should be progressed as able to improve functional independence.   Per eval: Marissa Garcia is a 58 y.o. female who was seen today for  physical therapy evaluation and treatment for persistent R shoulder and Thoracic pain. She is demonstrating decreased R shoulder AROM, inability to tolerate testing position for shoulder impingement tests, and pain with palpation along BIL TS spine. She has related pain and difficulty with overhead reaching, lifting, upper body dressing, bathing, household management and occupational duties. She does respond well to gentle GH distraction and Grade II lateral mobilization, reporting mild pain relief.  She requires skilled PT services at this time to address relevant deficits and improve overall function.     OBJECTIVE IMPAIRMENTS: decreased activity tolerance, decreased ROM, decreased strength, impaired UE functional use, postural dysfunction, and pain.   ACTIVITY LIMITATIONS: carrying, lifting, sleeping, bathing, and dressing  PARTICIPATION LIMITATIONS: meal prep, cleaning, laundry, interpersonal relationship, shopping, and occupation  PERSONAL FACTORS: Profession, Time since onset of injury/illness/exacerbation, and 3+ comorbidities: Relevant PMHx includes T12 burst fracture, HA, Parkinson's Disease, Chronic Neck Pain, Sprain of Right Rotator Cuff Capsule, R Wrist Pain are also affecting patient's functional outcome.   REHAB POTENTIAL: Fair    CLINICAL DECISION MAKING: Evolving/moderate complexity  EVALUATION COMPLEXITY: Moderate   GOALS: Goals reviewed with patient? YES  SHORT TERM GOALS: Target date: 12/08/2023   Patient will be independent with initial home program at least 3 days/week.  Baseline: provided at eval Goal Status: MET Pt reports adherence 12/02/23   2.  Patient will demonstrate improved postural awareness for at least 15 minutes while seated without need for cueing from PT.  Baseline: see objective measures Goal Status: Ongoing   3.  Patient will demonstrate improved R Shoulder elevation AROM to at least 145 degrees.  Baseline: 84 flexion, 80 abduction  Goal status:  Progressing 12/24/23: 120   LONG TERM GOALS: Target date: 02/07/2024   Patient will report improved overall functional ability  with QuickDASH score of 40 or less.  Baseline: 56 01/06/2024: 70.45 Goal Status: ONGOING  2.  Patient demonstrate improved functional IR reach to at least T12 in order to improved ability to wash her back, without exacerbation of symptoms.  Baseline: L3 12/26/2023: L3 Goal status: ONGOING   3.  Patient will demonstrate ability to perform overhead lifting of at least 10# using appropriate body mechanics and with no more than minimal pain in order to safely perform normal daily/occupational tasks.   Baseline: unable to lift higher than chest height  12/26/23: only able to lift from the floor  Goal Status: ONGOING   4.  Patient will report ability to perform normal occupational duties, with no more than 4/10 pain at end of most work days.   Baseline: 10/10 pain   12/26/2023: patient replying more on CL UE for cleaning, but has moderate-to-severe pain using R arm for reaching, lifting (waist level, counter height, overhead), reaching behind her back.    Goal Status: ONGOING    PLAN:  PT FREQUENCY: 2x/week for 1 weeks, 1x/week for 4 weeks   PT DURATION: other: 5 weeks  PLANNED INTERVENTIONS: 97164- PT Re-evaluation, 97110-Therapeutic exercises, 97530- Therapeutic activity, 97112- Neuromuscular re-education, 97535- Self Care, [id number]- Manual therapy, [id number]- Electrical stimulation (unattended), Patient/Family education, Taping, Joint mobilization, Spinal mobilization, Cryotherapy, and Moist heat   [employer] Authorization   Choose one: Rehabilitative  Standardized Assessment or Functional Outcome Tool: See Pain Assessment and Quick DASH  Score or Percent Disability: 56.8  12/26/23: 70.45  Body Parts Treated (Select each separately):  Shoulder. Overall deficits/functional limitations for body part selected: moderate Cervicothoracic. Overall deficits/functional  limitations for body part selected: moderate    PLAN FOR NEXT SESSION: address periscapular/postural mm endurance, address TS and R shoulder mobility, manual therapy and modalities as indicated, otherwise progress towards established rehab goals.    Corean Pouch PTA  01/06/2024 5:48 PM

## 2024-01-08 ENCOUNTER — Ambulatory Visit

## 2024-01-08 DIAGNOSIS — R293 Abnormal posture: Secondary | ICD-10-CM | POA: Diagnosis not present

## 2024-01-08 DIAGNOSIS — M546 Pain in thoracic spine: Secondary | ICD-10-CM | POA: Diagnosis not present

## 2024-01-08 DIAGNOSIS — G8929 Other chronic pain: Secondary | ICD-10-CM | POA: Diagnosis not present

## 2024-01-08 DIAGNOSIS — M25511 Pain in right shoulder: Secondary | ICD-10-CM | POA: Diagnosis not present

## 2024-01-08 DIAGNOSIS — M6281 Muscle weakness (generalized): Secondary | ICD-10-CM

## 2024-01-08 NOTE — Therapy (Signed)
 OUTPATIENT PHYSICAL THERAPY TREATMENT   Patient Name: Marissa Garcia MRN: [id number] DOB:[birth year], 58 y.o., female Today's Date: 01/08/2024  END OF SESSION:   PT End of Session - 01/08/24 0834     Visit Number 16    Number of Visits 16    Date for PT Re-Evaluation 01/23/24    Authorization Type [facility]    Authorization Time Period 6 additional visits approved, 12/26/23-02/15/24    Authorization - Visit Number 5    Authorization - Number of Visits 6    PT Start Time 0831    PT Stop Time 0906    PT Time Calculation (min) 35 min    Activity Tolerance Patient tolerated treatment well;Patient limited by pain    Behavior During Therapy [facility] for tasks assessed/performed           Past Medical History:  Diagnosis Date   Burst fracture of T12 vertebra (HCC) 10/13/2018   Complication of anesthesia    Constipation    Headache    Hypercholesteremia    Parkinson's disease (HCC)    Past Surgical History:  Procedure Laterality Date   CESAREAN SECTION     Patient Active Problem List   Diagnosis Date Noted   Lumbar pain 03/22/2022   Impingement syndrome of right shoulder 08/05/2019   Chronic neck pain 08/05/2019   Sprain of right rotator cuff capsule 07/14/2019   Body mass index (BMI) 27.0-27.9, adult 07/14/2019   Nutritional counseling 07/13/2019   Parkinson's disease (HCC) 04/22/2019   Lumbar radiculopathy 04/22/2019   Burst fracture of thoracic vertebra (HCC) 10/15/2018   Atrophic vulvovaginitis 01/25/2016   Perimenopausal 01/25/2016   Dental caries 07/05/2010   Headache 03/21/2010   Anxiety state 02/27/2010   ACID REFLUX DISEASE 02/27/2010   WRIST PAIN, RIGHT 02/27/2010   DIZZINESS 02/27/2010   CHEST PAIN 02/15/2010   HYPERCHOLESTEROLEMIA 05/13/2008    PCP: Theotis Haze ORN, NP  REFERRING PROVIDER: Delbert Clam, MD  REFERRING DIAG:  M75.41 (ICD-10-CM) - Impingement syndrome of right shoulder  M54.6,G89.29 (ICD-10-CM) - Chronic midline thoracic back pain     THERAPY DIAG:  Chronic right shoulder pain  Muscle weakness (generalized)  Abnormal posture  Pain in thoracic spine   Rationale for Evaluation and Treatment: Rehabilitation  ONSET DATE: 10/13/2018  SUBJECTIVE:                                                                                                                                                                                     Per eval: Patient presents to PT today d/t R shoulder pain and upper back pain that has been present since her fall in 2020. She states that her symptoms  started after a fall, but they were just focused on my back at the time, so they didn't really deal with my arm. She reports that it has been getting worse of the past few months. She would like to know what type of exercises that she can do at home. She has most difficulty with raising her R arm and reaching her back.  Patient verbalizes understanding that she can utilize interpretation services, however she states that she does not need that. She does prefer to receive written information in Spanish.  Hand dominance: Right  SUBJECTIVE STATEMENT: Patient reports that she had a lot of pain last night.   PERTINENT HISTORY: Relevant PMHx includes T12 burst fracture, HA, Parkinson's Disease, Chronic Neck Pain, Sprain of Right Rotator Cuff Capsule, R Wrist Pain  PAIN:  Are you having pain? 8/10 R shoulder blade, no shoulder pain  Per eval: Yes: NPRS scale: 10/10 Pain location: R shoulder and BIL TS, occasionally radiates to R side of neck Pain description: burning, R hand paresthesia. Aggravating factors: lifting arm  Relieving factors: topical cream  PRECAUTIONS: None  RED FLAGS: None   WEIGHT BEARING RESTRICTIONS: No  FALLS:  Has patient fallen in last 6 months? No  LIVING ENVIRONMENT: Lives with: lives with their family and lives with their daughter Lives in: House/apartment Stairs: Yes: Internal: 1 flight steps; not using d/t  bedroom on first floor  Has following equipment at home: None  OCCUPATION: Cleaning houses  PLOF: Independent  PATIENT GOALS:To have less pain, be able to lift her arm, wash her back and perform work activities   NEXT MD VISIT: 11/11/2023 Neurology Appt  OBJECTIVE:  Note: Objective measures were completed at Evaluation unless otherwise noted.  DIAGNOSTIC FINDINGS:  10/13/2023: R SHOULDER XRAY FINDINGS: There is no evidence of fracture or dislocation. There is no evidence of arthropathy or other focal bone abnormality. Soft tissues are unremarkable.   IMPRESSION: Negative.  10/13/2023: CHEST XRAY FINDINGS: The heart size and mediastinal contours are within normal limits. Both lungs are clear. The visualized skeletal structures are unremarkable.   IMPRESSION: No active cardiopulmonary disease.   PATIENT SURVEYS:  Quick DASH: 56.8  12/26/2023: 70.45  COGNITION: Overall cognitive status: Within functional limits for tasks assessed     SENSATION: Not tested  POSTURE: Rounded Shoulders  UPPER EXTREMITY ROM:   Active ROM Right eval Left eval R 11/18/23 Right 12/24/23  Shoulder flexion A 84 (P 110) WFL A: 114 deg * A: 120*  Shoulder extension      Shoulder abduction A 80 (P 85) WFL    Shoulder adduction      Shoulder internal rotation Functional reach to L3     Shoulder external rotation      Elbow flexion      Elbow extension      Wrist flexion      Wrist extension      Wrist ulnar deviation      Wrist radial deviation      Wrist pronation      Wrist supination      (Blank rows = not tested)  UPPER EXTREMITY MMT:  MMT Right eval Left eval Right 12/26/2023  Left 12/26/2023   Shoulder flexion 4- 4+ 4 4+  Shoulder extension      Shoulder abduction 4 4+ 44 4+  Shoulder adduction      Shoulder internal rotation 4 4 4 4   Shoulder external rotation 2+ 4- 3- 4  Middle trapezius  Lower trapezius      Elbow flexion      Elbow extension      Wrist flexion       Wrist extension      Wrist ulnar deviation      Wrist radial deviation      Wrist pronation      Wrist supination      Grip strength (lbs)      (Blank rows = not tested)  SHOULDER SPECIAL TESTS: Impingement tests: Neer impingement test: positive  and Hawkins/Kennedy impingement test: positive  Positive result, subsequent to intolerance of testing positions d/t painful passive and active shoulder elevation AROM    Mild symptom relief following gentle GH distraction and Grade II lateral mobilization   PALPATION:  Pain with palpation throughout TS CPA; painfree throughout LS CPA Pain with palpation along medial border of BIL scapula, and BIL parascapular mm.                                                                                                                              TREATMENT DATE:   [facility] Adult PT Treatment:                                                DATE: 01/08/2024  Therapeutic Exercise: Seated Pulley x 2 min flexion/scaption each Supine chest press with dowel 2x10, 2.5lb Updated and reviewed HEP  Manual Therapy: STM and myofascial trigger point release for L UT, scalene, levator scap So release Positional release Rt upper trap  Neuromuscular re-ed: Standing shoulder rows GTB 2 x 10 Standing Shoulder extension GTB 2 x 10  Sidelying ER RUE 2x10  Seated horizontal abduction RTB 2x10   [facility] Adult PT Treatment:                                                DATE: 01/06/24 Therapeutic Exercise: Seated Pulley x 3 min flexion/scaption each Supine chest press with dowel 2x10 Manual Therapy: STM and myofascial trigger point release for L UT, scalene, levator scap So release Positional release Rt upper trap Neuromuscular re-ed: Standing shoulder rows RTB 3 x 10 Sidelying ER RUE 2x10  Seated horizontal abduction YTB 2x10      PATIENT EDUCATION: Education details: rationale for interventions, HEP  Person educated: Patient Education method:  Explanation, Demonstration, Tactile cues, Verbal cues Education comprehension: verbalized understanding, returned demonstration, verbal cues required, tactile cues required, and needs further education     HOME EXERCISE PROGRAM: Access Code: [email or web address] URL: [email or web address] Date: 11/20/2023 Prepared by: Alm Jenny  Exercises - Seated Shoulder Shrugs  - 1 x daily - 7 x weekly - 2 sets - 10 reps - 3  sec hold - Seated Bilateral Shoulder Flexion Towel Slide at Table Top  - 1 x daily - 7 x weekly - 2 sets - 10 reps - 3 sec hold - Seated Shoulder Abduction Towel Slide at Table Top with Forearm in Neutral  - 1 x daily - 7 x weekly - 2 sets - 10 reps - Standing Shoulder Row with Anchored Resistance  - 1 x daily - 7 x weekly - 2-3 sets - 8 reps  ASSESSMENT:  CLINICAL IMPRESSION: Latondra had good tolerance of today's treatment session, which focused on progression of resistance for periscapular strengthening. Patient continues to respond well to manual therapy. Plan is for patient to remain independent with HEP for 2 weeks followed by 1 additional visit for discharge from skilled PT.    Per eval: Anslee is a 58 y.o. female who was seen today for physical therapy evaluation and treatment for persistent R shoulder and Thoracic pain. She is demonstrating decreased R shoulder AROM, inability to tolerate testing position for shoulder impingement tests, and pain with palpation along BIL TS spine. She has related pain and difficulty with overhead reaching, lifting, upper body dressing, bathing, household management and occupational duties. She does respond well to gentle GH distraction and Grade II lateral mobilization, reporting mild pain relief.  She requires skilled PT services at this time to address relevant deficits and improve overall function.     OBJECTIVE IMPAIRMENTS: decreased activity tolerance, decreased ROM, decreased strength, impaired UE functional use, postural dysfunction, and  pain.   ACTIVITY LIMITATIONS: carrying, lifting, sleeping, bathing, and dressing  PARTICIPATION LIMITATIONS: meal prep, cleaning, laundry, interpersonal relationship, shopping, and occupation  PERSONAL FACTORS: Profession, Time since onset of injury/illness/exacerbation, and 3+ comorbidities: Relevant PMHx includes T12 burst fracture, HA, Parkinson's Disease, Chronic Neck Pain, Sprain of Right Rotator Cuff Capsule, R Wrist Pain are also affecting patient's functional outcome.   REHAB POTENTIAL: Fair    CLINICAL DECISION MAKING: Evolving/moderate complexity  EVALUATION COMPLEXITY: Moderate   GOALS: Goals reviewed with patient? YES  SHORT TERM GOALS: Target date: 12/08/2023   Patient will be independent with initial home program at least 3 days/week.  Baseline: provided at eval Goal Status: MET Pt reports adherence 12/02/23   2.  Patient will demonstrate improved postural awareness for at least 15 minutes while seated without need for cueing from PT.  Baseline: see objective measures Goal Status: Ongoing   3.  Patient will demonstrate improved R Shoulder elevation AROM to at least 145 degrees.  Baseline: 84 flexion, 80 abduction  Goal status: Progressing 12/24/23: 120   LONG TERM GOALS: Target date: 02/07/2024   Patient will report improved overall functional ability with QuickDASH score of 40 or less.  Baseline: 56  70.45 Goal Status: ONGOING  2.  Patient demonstrate improved functional IR reach to at least T12 in order to improved ability to wash her back, without exacerbation of symptoms.  Baseline: L3 12/26/2023: L3 Goal status: ONGOING   3.  Patient will demonstrate ability to perform overhead lifting of at least 10# using appropriate body mechanics and with no more than minimal pain in order to safely perform normal daily/occupational tasks.   Baseline: unable to lift higher than chest height  12/26/23: only able to lift from the floor  Goal Status: ONGOING   4.   Patient will report ability to perform normal occupational duties, with no more than 4/10 pain at end of most work days.   Baseline: 10/10 pain   12/26/2023: patient replying more  on CL UE for cleaning, but has moderate-to-severe pain using R arm for reaching, lifting (waist level, counter height, overhead), reaching behind her back.    Goal Status: ONGOING    PLAN:  PT FREQUENCY: 2x/week for 1 weeks, 1x/week for 4 weeks   PT DURATION: other: 5 weeks  PLANNED INTERVENTIONS: 97164- PT Re-evaluation, 97110-Therapeutic exercises, 97530- Therapeutic activity, 97112- Neuromuscular re-education, 97535- Self Care, [id number]- Manual therapy, [id number]- Electrical stimulation (unattended), Patient/Family education, Taping, Joint mobilization, Spinal mobilization, Cryotherapy, and Moist heat   [employer] Authorization   Choose one: Rehabilitative  Standardized Assessment or Functional Outcome Tool: See Pain Assessment and Quick DASH  Score or Percent Disability: 56.8  12/26/23: 70.45  Body Parts Treated (Select each separately):  Shoulder. Overall deficits/functional limitations for body part selected: moderate Cervicothoracic. Overall deficits/functional limitations for body part selected: moderate    PLAN FOR NEXT SESSION: address periscapular/postural mm endurance, address TS and R shoulder mobility, manual therapy and modalities as indicated, otherwise progress towards established rehab goals.    Marko Molt, PT, DPT  01/08/2024 9:15 AM

## 2024-01-09 ENCOUNTER — Ambulatory Visit: Admitting: Neurology

## 2024-01-09 DIAGNOSIS — R482 Apraxia: Secondary | ICD-10-CM

## 2024-01-09 DIAGNOSIS — G245 Blepharospasm: Secondary | ICD-10-CM

## 2024-01-09 MED ORDER — INCOBOTULINUMTOXINA 50 UNITS IM SOLR
50.0000 [IU] | INTRAMUSCULAR | Status: AC
  Administered 2024-01-09: 25 [IU] via INTRAMUSCULAR

## 2024-01-09 NOTE — Addendum Note (Signed)
 Addended by: MERCER CREE C on: 01/09/2024 03:09 PM   Modules accepted: Orders

## 2024-01-09 NOTE — Procedures (Signed)
 [facility]   Procedure Note Botox  Attending: Dr. 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  Risk discussed included, but were not limited pain and discomfort, bleeding, bruising, excessive weakness, venous thrombosis, muscle atrophy and drooping of eyelids.  A copy of the patient medication guide was given to the patient which explains the blackbox warning.  Patients identity and treatment sites confirmed Yes.  .  Details of Procedure: Skin was cleaned with alcohol.  A 30 gauge, 1/2 inch needle was introduced to the target muscle.  Prior to injection, the needle plunger was aspirated to make sure the needle was not within a blood vessel.  There was no blood retrieved on aspiration.    Following is a summary of the muscles injected  And the amount of incobotulinumtoxin A used:   Dilution 0.9% preservative free saline mixed with 50 u incobotulinumtoxin A to make 5 U per 0.1cc   Injections  Location Left  Right Units  Upper lateral orbicularis oculi  2.5 2.5 5  Upper medial orbicularis oculi      Lateral orbicularis oculi  5 5 10   Lower lateral orbicularis oculi  2.5 2.5 5  Lower medial orbicularis oculi 2.5 2.5 5  Trapezius          TOTAL UNITS:   25   Agent: Incobotulinumtoxin A.  1 vials of Botox were used,   Total injected (Units): 25  Total wasted (Units): 25   Pt tolerated procedure well without complications.   Reinjection is anticipated in 3 months.

## 2024-01-21 ENCOUNTER — Ambulatory Visit: Payer: Self-pay | Attending: Nurse Practitioner

## 2024-01-21 DIAGNOSIS — M6281 Muscle weakness (generalized): Secondary | ICD-10-CM | POA: Diagnosis not present

## 2024-01-21 DIAGNOSIS — M25511 Pain in right shoulder: Secondary | ICD-10-CM | POA: Insufficient documentation

## 2024-01-21 DIAGNOSIS — R293 Abnormal posture: Secondary | ICD-10-CM | POA: Diagnosis not present

## 2024-01-21 DIAGNOSIS — G8929 Other chronic pain: Secondary | ICD-10-CM | POA: Insufficient documentation

## 2024-01-21 DIAGNOSIS — M546 Pain in thoracic spine: Secondary | ICD-10-CM | POA: Insufficient documentation

## 2024-01-21 NOTE — Therapy (Signed)
 OUTPATIENT PHYSICAL THERAPY TREATMENT   Patient Name: Marissa Garcia MRN: [id number] DOB:01-27-1966, 58 y.o., female Today's Date: 01/21/2024  END OF SESSION:   PT End of Session - 01/21/24 1618     Visit Number 17    Date for PT Re-Evaluation 01/23/24    Authorization Type [employer]    Authorization Time Period 6 additional visits approved, 12/26/23-02/15/24    Authorization - Visit Number 6    Authorization - Number of Visits 6    PT Start Time 1619    PT Stop Time 1649    PT Time Calculation (min) 30 min    Activity Tolerance Patient tolerated treatment well;Patient limited by pain    Behavior During Therapy [facility] for tasks assessed/performed         Past Medical History:  Diagnosis Date   Burst fracture of T12 vertebra (HCC) 10/13/2018   Complication of anesthesia    Constipation    Headache    Hypercholesteremia    Parkinson's disease (HCC)    Past Surgical History:  Procedure Laterality Date   CESAREAN SECTION     Patient Active Problem List   Diagnosis Date Noted   Lumbar pain 03/22/2022   Impingement syndrome of right shoulder 08/05/2019   Chronic neck pain 08/05/2019   Sprain of right rotator cuff capsule 07/14/2019   Body mass index (BMI) 27.0-27.9, adult 07/14/2019   Nutritional counseling 07/13/2019   Parkinson's disease (HCC) 04/22/2019   Lumbar radiculopathy 04/22/2019   Burst fracture of thoracic vertebra (HCC) 10/15/2018   Atrophic vulvovaginitis 01/25/2016   Perimenopausal 01/25/2016   Dental caries 07/05/2010   Headache 03/21/2010   Anxiety state 02/27/2010   ACID REFLUX DISEASE 02/27/2010   WRIST PAIN, RIGHT 02/27/2010   DIZZINESS 02/27/2010   CHEST PAIN 02/15/2010   HYPERCHOLESTEROLEMIA 05/13/2008    PCP: Theotis Haze ORN, NP  REFERRING PROVIDER: Delbert Clam, MD  REFERRING DIAG:  M75.41 (ICD-10-CM) - Impingement syndrome of right shoulder  M54.6,G89.29 (ICD-10-CM) - Chronic midline thoracic back pain    THERAPY DIAG:   Chronic right shoulder pain  Muscle weakness (generalized)  Abnormal posture  Pain in thoracic spine   Rationale for Evaluation and Treatment: Rehabilitation  ONSET DATE: 10/13/2018  SUBJECTIVE:                                                                                                                                                                                     Per eval: Patient presents to PT today d/t R shoulder pain and upper back pain that has been present since her fall in 2020. She states that her symptoms started after a fall, but they were just focused  on my back at the time, so they didn't really deal with my arm. She reports that it has been getting worse of the past few months. She would like to know what type of exercises that she can do at home. She has most difficulty with raising her R arm and reaching her back.  Patient verbalizes understanding that she can utilize interpretation services, however she states that she does not need that. She does prefer to receive written information in Spanish.  Hand dominance: Right  SUBJECTIVE STATEMENT: Patient reports that she is not having much pain today. She states she is still only sleeping for about 4 hours at a time.   PERTINENT HISTORY: Relevant PMHx includes T12 burst fracture, HA, Parkinson's Disease, Chronic Neck Pain, Sprain of Right Rotator Cuff Capsule, R Wrist Pain  PAIN:  Are you having pain? 8/10 R shoulder blade, no shoulder pain  Per eval: Yes: NPRS scale: 10/10 Pain location: R shoulder and BIL TS, occasionally radiates to R side of neck Pain description: burning, R hand paresthesia. Aggravating factors: lifting arm  Relieving factors: topical cream  PRECAUTIONS: None  RED FLAGS: None   WEIGHT BEARING RESTRICTIONS: No  FALLS:  Has patient fallen in last 6 months? No  LIVING ENVIRONMENT: Lives with: lives with their family and lives with their daughter Lives in: House/apartment Stairs:  Yes: Internal: 1 flight steps; not using d/t bedroom on first floor  Has following equipment at home: None  OCCUPATION: Cleaning houses  PLOF: Independent  PATIENT GOALS:To have less pain, be able to lift her arm, wash her back and perform work activities   NEXT MD VISIT: 11/11/2023 Neurology Appt  OBJECTIVE:  Note: Objective measures were completed at Evaluation unless otherwise noted.  DIAGNOSTIC FINDINGS:  10/13/2023: R SHOULDER XRAY FINDINGS: There is no evidence of fracture or dislocation. There is no evidence of arthropathy or other focal bone abnormality. Soft tissues are unremarkable.   IMPRESSION: Negative.  10/13/2023: CHEST XRAY FINDINGS: The heart size and mediastinal contours are within normal limits. Both lungs are clear. The visualized skeletal structures are unremarkable.   IMPRESSION: No active cardiopulmonary disease.   PATIENT SURVEYS:  Quick DASH: 56.8  12/26/2023: 70.45  COGNITION: Overall cognitive status: Within functional limits for tasks assessed     SENSATION: Not tested  POSTURE: Rounded Shoulders  UPPER EXTREMITY ROM:   Active ROM Right eval Left eval R 11/18/23 Right 12/24/23 Right 01/21/24  Shoulder flexion A 84 (P 110) WFL A: 114 deg * A: 120* 150  Shoulder extension       Shoulder abduction A 80 (P 85) WFL   130  Shoulder adduction       Shoulder internal rotation Functional reach to L3    L1  Shoulder external rotation       Elbow flexion       Elbow extension       Wrist flexion       Wrist extension       Wrist ulnar deviation       Wrist radial deviation       Wrist pronation       Wrist supination       (Blank rows = not tested)  UPPER EXTREMITY MMT:  MMT Right eval Left eval Right 12/26/2023  Left 12/26/2023   Shoulder flexion 4- 4+ 4 4+  Shoulder extension      Shoulder abduction 4 4+ 44 4+  Shoulder adduction      Shoulder internal rotation  4 4 4 4   Shoulder external rotation 2+ 4- 3- 4  Middle trapezius       Lower trapezius      Elbow flexion      Elbow extension      Wrist flexion      Wrist extension      Wrist ulnar deviation      Wrist radial deviation      Wrist pronation      Wrist supination      Grip strength (lbs)      (Blank rows = not tested)  SHOULDER SPECIAL TESTS: Impingement tests: Neer impingement test: positive  and Hawkins/Kennedy impingement test: positive  Positive result, subsequent to intolerance of testing positions d/t painful passive and active shoulder elevation AROM    Mild symptom relief following gentle GH distraction and Grade II lateral mobilization   PALPATION:  Pain with palpation throughout TS CPA; painfree throughout LS CPA Pain with palpation along medial border of BIL scapula, and BIL parascapular mm.                                                                                                                              TREATMENT DATE:  [facility] Adult PT Treatment:                                                DATE: 01/21/24 Therapeutic Activity Update and review of HEP Discussion of goal, progress with PT, discharging, following up with MD as needed Self Care: Theracane self instruction, MTPR and tack&stretch   [facility] Adult PT Treatment:                                                DATE: 01/08/2024  Therapeutic Exercise: Seated Pulley x 2 min flexion/scaption each Supine chest press with dowel 2x10, 2.5lb Updated and reviewed HEP  Manual Therapy: STM and myofascial trigger point release for L UT, scalene, levator scap So release Positional release Rt upper trap  Neuromuscular re-ed: Standing shoulder rows GTB 2 x 10 Standing Shoulder extension GTB 2 x 10  Sidelying ER RUE 2x10  Seated horizontal abduction RTB 2x10   [facility] Adult PT Treatment:                                                DATE: 01/06/24 Therapeutic Exercise: Seated Pulley x 3 min flexion/scaption each Supine chest press with dowel 2x10 Manual Therapy: STM and  myofascial trigger point release for L UT, scalene, levator scap So release Positional release Rt upper  trap Neuromuscular re-ed: Standing shoulder rows RTB 3 x 10 Sidelying ER RUE 2x10  Seated horizontal abduction YTB 2x10    PATIENT EDUCATION: Education details: rationale for interventions, HEP  Person educated: Patient Education method: Explanation, Demonstration, Tactile cues, Verbal cues Education comprehension: verbalized understanding, returned demonstration, verbal cues required, tactile cues required, and needs further education     HOME EXERCISE PROGRAM: Access Code: [email or web address] URL: [email or web address] Date: 01/21/2024 Prepared by: Corean Pouch  Exercises - Seated Shoulder Shrugs  - 1 x daily - 4 x weekly - 2 sets - 10 reps - Seated Bilateral Shoulder Flexion Towel Slide at Table Top  - 1 x daily - 4 x weekly - 2 sets - 10 reps - 3 sec hold - Seated Shoulder Abduction Towel Slide at Table Top with Forearm in Neutral  - 1 x daily - 4 x weekly - 2 sets - 10 reps - 3 sec hold - Standing Shoulder Row with Anchored Resistance  - 1 x daily - 4 x weekly - 2 sets - 10 reps - Shoulder extension with resistance - Neutral  - 1 x daily - 4 x weekly - 2 sets - 10 reps - Standing Shoulder Horizontal Abduction with Resistance  - 1 x daily - 4 x weekly - 2 sets - 10 reps - Supine Chest Press with Dumbbells  - 1 x daily - 4 x weekly - 2 sets - 10 reps - Seated Shoulder Flexion AAROM with Pulley Behind  - Sidelying Shoulder External Rotation  - 1 x daily - 4 x weekly - 2 sets - 10 reps  ASSESSMENT:  CLINICAL IMPRESSION: Patient presents to PT reporting that she feels ok today, not much pain, but that she is still having trouble sleeping at night. She has met or partially met most of her LTG. She shows significant improvement in her shoulder ROM and some improvement in her function and pain levels. Overall her function remains limited by her increased pain, especially  at the end of the workday and overnight. Patient is appropriate for DC from PT at this time.    Per eval: Aubryana is a 58 y.o. female who was seen today for physical therapy evaluation and treatment for persistent R shoulder and Thoracic pain. She is demonstrating decreased R shoulder AROM, inability to tolerate testing position for shoulder impingement tests, and pain with palpation along BIL TS spine. She has related pain and difficulty with overhead reaching, lifting, upper body dressing, bathing, household management and occupational duties. She does respond well to gentle GH distraction and Grade II lateral mobilization, reporting mild pain relief.  She requires skilled PT services at this time to address relevant deficits and improve overall function.     OBJECTIVE IMPAIRMENTS: decreased activity tolerance, decreased ROM, decreased strength, impaired UE functional use, postural dysfunction, and pain.   ACTIVITY LIMITATIONS: carrying, lifting, sleeping, bathing, and dressing  PARTICIPATION LIMITATIONS: meal prep, cleaning, laundry, interpersonal relationship, shopping, and occupation  PERSONAL FACTORS: Profession, Time since onset of injury/illness/exacerbation, and 3+ comorbidities: Relevant PMHx includes T12 burst fracture, HA, Parkinson's Disease, Chronic Neck Pain, Sprain of Right Rotator Cuff Capsule, R Wrist Pain are also affecting patient's functional outcome.   REHAB POTENTIAL: Fair    CLINICAL DECISION MAKING: Evolving/moderate complexity  EVALUATION COMPLEXITY: Moderate   GOALS: Goals reviewed with patient? YES  SHORT TERM GOALS: Target date: 12/08/2023   Patient will be independent with initial home program at least 3 days/week.  Baseline: provided at eval Goal Status: MET  Pt reports adherence 12/02/23   2.  Patient will demonstrate improved postural awareness for at least 15 minutes while seated without need for cueing from PT.  Baseline: see objective measures Goal  Status: MET 01/21/24: See above   3.  Patient will demonstrate improved R Shoulder elevation AROM to at least 145 degrees.  Baseline: 84 flexion, 80 abduction  Goal status: MET 12/24/23: 120 01/21/24: 150   LONG TERM GOALS: Target date: 02/07/2024   Patient will report improved overall functional ability with QuickDASH score of 40 or less.  Baseline: 56  70.45 68.2% Goal Status: NOT MET  2.  Patient demonstrate improved functional IR reach to at least T12 in order to improved ability to wash her back, without exacerbation of symptoms.  Baseline: L3 12/26/2023: L3 Goal status: Partially met 01/20/24: L1   3.  Patient will demonstrate ability to perform overhead lifting of at least 10# using appropriate body mechanics and with no more than minimal pain in order to safely perform normal daily/occupational tasks.   Baseline: unable to lift higher than chest height  12/26/23: only able to lift from the floor  Goal Status: NOT MET  01/21/24: Unable to lift higher than chest height, painful   4.  Patient will report ability to perform normal occupational duties, with no more than 4/10 pain at end of most work days.   Baseline: 10/10 pain   12/26/2023: patient replying more on CL UE for cleaning, but has moderate-to-severe pain using R arm for reaching, lifting (waist level, counter height, overhead), reaching behind her back.    Goal Status: Partially met   01/21/24: 6/10 pain average at the end of the work day   PLAN:  PT FREQUENCY: 2x/week for 1 weeks, 1x/week for 4 weeks   PT DURATION: other: 5 weeks  PLANNED INTERVENTIONS: 97164- PT Re-evaluation, 97110-Therapeutic exercises, 97530- Therapeutic activity, 97112- Neuromuscular re-education, 97535- Self Care, 02859- Manual therapy, G0283- Electrical stimulation (unattended), Patient/Family education, Taping, Joint mobilization, Spinal mobilization, Cryotherapy, and Moist heat   [employer] Authorization   Choose one:  Rehabilitative  Standardized Assessment or Functional Outcome Tool: See Pain Assessment and Quick DASH  Score or Percent Disability: 56.8  12/26/23: 70.45  Body Parts Treated (Select each separately):  Shoulder. Overall deficits/functional limitations for body part selected: moderate Cervicothoracic. Overall deficits/functional limitations for body part selected: moderate    PLAN FOR NEXT SESSION: address periscapular/postural mm endurance, address TS and R shoulder mobility, manual therapy and modalities as indicated, otherwise progress towards established rehab goals.    Corean Pouch PTA   01/21/2024 4:59 PM

## 2024-01-23 DIAGNOSIS — Z419 Encounter for procedure for purposes other than remedying health state, unspecified: Secondary | ICD-10-CM | POA: Diagnosis not present

## 2024-02-03 ENCOUNTER — Other Ambulatory Visit: Payer: Self-pay | Admitting: Neurology

## 2024-02-03 DIAGNOSIS — G20B2 Parkinson's disease with dyskinesia, with fluctuations: Secondary | ICD-10-CM

## 2024-02-11 ENCOUNTER — Telehealth: Payer: Self-pay | Admitting: Neurology

## 2024-02-11 NOTE — Telephone Encounter (Signed)
-----   Message from [place] Jamaury Gumz sent at 01/09/2024  3:01 PM EDT ----- Call and find out how she did with first round of botox.  Also, status of vyalev?

## 2024-02-11 NOTE — Telephone Encounter (Signed)
 Called daughter and left voicemail message

## 2024-02-12 NOTE — Telephone Encounter (Signed)
 Talked with or rep for [facility] and she went over what [employer] is wanting to approve patient. They are wanting a trial of a Compt inhibitor like Entacapone  for 12 months before they will approve

## 2024-02-13 NOTE — Telephone Encounter (Signed)
 [employer] appeal sent in

## 2024-02-22 DIAGNOSIS — Z419 Encounter for procedure for purposes other than remedying health state, unspecified: Secondary | ICD-10-CM | POA: Diagnosis not present

## 2024-03-05 ENCOUNTER — Telehealth: Payer: Self-pay | Admitting: Neurology

## 2024-03-05 NOTE — Telephone Encounter (Signed)
 Pt's daughter called in this morning and stated that the Parkinson Pump will arrive next Wednesday . Daughter Hasler stated that the pump needs to be register or programmed through our office. Please call. Thanks

## 2024-03-05 NOTE — Telephone Encounter (Signed)
 Pts daughter is calling back chelsea regarding the VM she left. Call back [phone]

## 2024-03-05 NOTE — Telephone Encounter (Signed)
Called patient's daughter back and left message.

## 2024-03-08 NOTE — Telephone Encounter (Signed)
 Called patients daughter and they are getting meds and coordinating with the Nurse.

## 2024-03-10 ENCOUNTER — Ambulatory Visit: Payer: Self-pay

## 2024-03-10 NOTE — Telephone Encounter (Signed)
 FYI Only or Action Required?: FYI only for provider: appointment scheduled on 04/01/24-added appointment to wait list.  Patient was last seen in primary care on 10/22/2023 by Delbert Clam, MD.  Called Nurse Triage reporting Arm Pain and Leg Pain.  Symptoms began chronic but worsening past few days, new decreased ROM.  Interventions attempted: OTC medications: Tylenol  and Other: physical therapy.  Symptoms are: gradually worsening.  Triage Disposition: See PCP Within 2 Weeks  Patient/caregiver understands and will follow disposition?: Yes, but will wait    Copied from CRM [id number]. Topic: Clinical - Red Word Triage >> Mar 10, 2024  4:18 PM Dedra B wrote: [facility] that prompted transfer to Nurse Triage: Pt daughter, Sinahi, said pt has pain in arms and legs. Pt completed PT and feels her pain has worsened and impacted her ability to complete daily activities. Warm transfer to NT. Reason for Disposition  Arm pain is a chronic symptom (recurrent or ongoing AND present > 4 weeks)  Leg pain or muscle cramp is a chronic symptom (recurrent or ongoing AND present > 4 weeks)  Answer Assessment - Initial Assessment Questions Additional info:  Daughter called for patient. She finished PT but still having arm and leg pain. Decreased ROM to right arm. Scheduled next available appointment on 04/01/24, added to wait list.   1. ONSET: When did the pain start?      2020 fall. She completed therapy then. Reoccurred recently and completed PT but pain is increasing especially at night.  2. LOCATION: Where is the pain located?      Bilateral legs  3. PAIN: How bad is the pain?    (Scale 1-10; or mild, moderate, severe)     Moderate 4. WORK OR EXERCISE: Has there been any recent work or exercise that involved this part of the body?      no 5. CAUSE: What do you think is causing the leg pain?     Fall in 2020 6. OTHER SYMPTOMS: Do you have any other symptoms? (e.g., chest pain, back pain,  breathing difficulty, swelling, rash, fever, numbness, weakness)     Bilateral arm pain  Answer Assessment - Initial Assessment Questions 1. ONSET: When did the pain start?     2020 after a fall she completed pt then pain reoccured 2. LOCATION: Where is the pain located?     Bilateral arms  3. PAIN: How bad is the pain? (Scale 0-10; or none, mild, moderate, severe)     Moderate but also having decreased ROM to right arm  4. WORK OR EXERCISE: Has there been any recent work or exercise that involved this part of the body?     No  5. CAUSE: What do you think is causing the arm pain?     Fall in 2020 has flairs of pain, recently completed PT without improvement 6. OTHER SYMPTOMS: Do you have any other symptoms? (e.g., neck pain, swelling, rash, fever, numbness, weakness)     Bilateral leg pain 7. PREGNANCY: Is there any chance you are pregnant? When was your last menstrual period?  Protocols used: Leg Pain-A-AH, Arm Pain-A-AH

## 2024-03-11 NOTE — Telephone Encounter (Signed)
 Noted

## 2024-03-17 ENCOUNTER — Encounter: Payer: Self-pay | Admitting: Nurse Practitioner

## 2024-03-17 ENCOUNTER — Ambulatory Visit: Attending: Nurse Practitioner | Admitting: Nurse Practitioner

## 2024-03-17 VITALS — BP 108/65 | HR 60 | Resp 19 | Ht 62.0 in | Wt 141.6 lb

## 2024-03-17 DIAGNOSIS — B351 Tinea unguium: Secondary | ICD-10-CM

## 2024-03-17 DIAGNOSIS — G8929 Other chronic pain: Secondary | ICD-10-CM

## 2024-03-17 DIAGNOSIS — L84 Corns and callosities: Secondary | ICD-10-CM

## 2024-03-17 DIAGNOSIS — M25511 Pain in right shoulder: Secondary | ICD-10-CM | POA: Diagnosis not present

## 2024-03-17 DIAGNOSIS — Z1211 Encounter for screening for malignant neoplasm of colon: Secondary | ICD-10-CM | POA: Diagnosis not present

## 2024-03-17 MED ORDER — CICLOPIROX 8 % EX SOLN: Freq: Every day | CUTANEOUS | 1 refills | Status: AC

## 2024-03-17 MED ORDER — MELOXICAM 15 MG PO TABS: 15.0000 mg | ORAL_TABLET | Freq: Every day | ORAL | 0 refills | Status: AC

## 2024-03-17 NOTE — Progress Notes (Signed)
 Assessment & Plan:  [name] was seen today for shoulder injury.  Diagnoses and all orders for this visit:  Chronic right shoulder pain -     Ambulatory referral to Orthopedic Surgery -     meloxicam  (MOBIC ) 15 MG tablet; Take 1 tablet (15 mg total) by mouth daily. Chronic right shoulder pain likely due to tendinitis or bursitis. X-ray unremarkable. Pain disrupts sleep and daily activities. - Refer to orthopedic specialist for evaluation and possible steroid injection. - Prescribe higher dose meloxicam  for pain managemen   Colon cancer screening -     Ambulatory referral to Gastroenterology  Pre-ulcerative corn or callous -     Ambulatory referral to Podiatry Calluses on great toes causing discomfort. Self-treatment ineffective. - Refer to podiatrist for callus removal.  Onychomycosis -     ciclopirox (PENLAC) 8 % solution; Apply topically at bedtime. Apply over nail and surrounding skin. Apply daily over previous coat. After seven (7) days, may remove with alcohol and continue cycle. Fungal nail infection. Concerns about liver effects of oral antifungals. - Prescribe Penlac nail lacquer. - Instruct on application: nightly for a week, remove weekly, repeat.    Patient has been counseled on age-appropriate routine health concerns for screening and prevention. These are reviewed and up-to-date. Referrals have been placed accordingly. Immunizations are up-to-date or declined.    Subjective:   Chief Complaint  Patient presents with   Shoulder Injury    Fall from 2020. Right shoulder pain. Limited range of motion in right arm.    Marissa Garcia is a 58 year old female with Parkinson's disease who presents with persistent chronic right shoulder pain.  She experiences persistent right shoulder pain that worsens with activity, such as dressing and stretching. The pain is located at the top of the shoulder and laterally and radiates to the back and neck. It disrupts her sleep, causing  her to wake up at night. She has not been taking any medication for the pain and previously tried meloxicam , which was prescribed for back pain, but did not take it for long. Pain has been ongoing for a few years.   She has calluses on both great toes, which she has attempted to manage herself, but they recur. The calluses are hard and painful, especially when untreated for a couple of days. She has been using a cream for a suspected fungal issue on her toes and fingernail but rash persists.   Review of Systems  Constitutional:  Negative for fever, malaise/fatigue and weight loss.  HENT: Negative.  Negative for nosebleeds.   Eyes: Negative.  Negative for blurred vision, double vision and photophobia.  Respiratory: Negative.  Negative for cough and shortness of breath.   Cardiovascular: Negative.  Negative for chest pain, palpitations and leg swelling.  Gastrointestinal: Negative.  Negative for heartburn, nausea and vomiting.  Musculoskeletal:  Positive for joint pain. Negative for myalgias.  Skin:  Positive for rash.  Neurological: Negative.  Negative for dizziness, focal weakness, seizures and headaches.  Psychiatric/Behavioral: Negative.  Negative for suicidal ideas.     Past Medical History:  Diagnosis Date   Burst fracture of T12 vertebra (HCC) 10/13/2018   Complication of anesthesia    Constipation    Headache    Hypercholesteremia    Parkinson's disease (HCC)     Past Surgical History:  Procedure Laterality Date   CESAREAN SECTION      Family History  Problem Relation Age of Onset   Hypertension Father    Hyperlipidemia  Father    Hyperlipidemia Mother    Parkinson's disease Neg Hx     Social History Reviewed with no changes to be made today.   Outpatient Medications Prior to Visit  Medication Sig Dispense Refill   carbidopa -levodopa  (SINEMET  CR) 50-200 MG tablet TAKE 1 TABLET BY MOUTH AT BEDTIME 90 tablet 0   traZODone  (DESYREL ) 50 MG tablet Take 1 tablet (50 mg  total) by mouth at bedtime. 90 tablet 1   carbidopa -levodopa  (SINEMET  IR) 25-100 MG tablet 2 in the AM at 9am/2 tablets at 12:30pm/1 tablet at 3-4 PM/1 at 6pm-7 PM; extra prm (Patient not taking: Reported on 03/17/2024) 630 tablet 1   incobotulinumtoxinA  (XEOMIN ) 50 units SOLR injection Inject 50 units into the head and neck every 90 days per Dr. Evonnie (Patient not taking: Reported on 03/17/2024) 1 each 3   Facility-Administered Medications Prior to Visit  Medication Dose Route Frequency Provider Last Rate Last Admin   incobotulinumtoxinA  (XEOMIN ) 50 units injection 50 Units  50 Units Intramuscular Q90 days Tat, Asberry RAMAN, DO   25 Units at 01/09/24 1508    Allergies  Allergen Reactions   Propofol        Objective:    BP 108/65 (BP Location: Left Arm, Patient Position: Sitting, Cuff Size: Normal)   Pulse 60   Resp 19   Ht 5' 2 (1.575 m)   Wt 141 lb 9.6 oz (64.2 kg)   LMP 03/19/2016   SpO2 98%   BMI 25.90 kg/m  Wt Readings from Last 3 Encounters:  03/17/24 141 lb 9.6 oz (64.2 kg)  11/11/23 142 lb 9.6 oz (64.7 kg)  10/22/23 141 lb 3.2 oz (64 kg)    Physical Exam Vitals and nursing note reviewed.  Constitutional:      Appearance: She is well-developed.  HENT:     Head: Normocephalic and atraumatic.  Cardiovascular:     Rate and Rhythm: Normal rate and regular rhythm.     Heart sounds: Normal heart sounds. No murmur heard.    No friction rub. No gallop.  Pulmonary:     Effort: Pulmonary effort is normal. No tachypnea or respiratory distress.     Breath sounds: Normal breath sounds. No decreased breath sounds, wheezing, rhonchi or rales.  Chest:     Chest wall: No tenderness.  Musculoskeletal:     Right shoulder: Tenderness present. Decreased range of motion.     Cervical back: Normal range of motion.       Feet:  Feet:     Right foot:     Skin integrity: Callus present.     Toenail Condition: Fungal disease present.    Left foot:     Skin integrity: Callus present.      Toenail Condition: Fungal disease present. Skin:    General: Skin is warm and dry.  Neurological:     Mental Status: She is alert and oriented to person, place, and time.     Coordination: Coordination normal.  Psychiatric:        Behavior: Behavior normal. Behavior is cooperative.        Thought Content: Thought content normal.        Judgment: Judgment normal.          Patient has been counseled extensively about nutrition and exercise as well as the importance of adherence with medications and regular follow-up. The patient was given clear instructions to go to ER or return to medical center if symptoms don't improve, worsen or new problems  develop. The patient verbalized understanding.   Follow-up: Return if symptoms worsen or fail to improve.   Haze LELON Servant, FNP-BC [facility] [place], [place] [phone]   03/17/2024, 11:08 AM

## 2024-03-17 NOTE — Patient Instructions (Signed)
[facility] at [facility] Address: [address] [place], [place], [place] [address] Phone: [phone]

## 2024-03-25 NOTE — Patient Instructions (Signed)
 Stop all oral carbidopa /levodopa  25/100 IR and ER Congratulations!  You have gotten started on the Vyalev pump.   VYALEV Complete also offers a 24/7 Hotline with trained nurse support to provide you with tips, troubleshooting advice, and answers to questions.  You may call [phone] with questions or concerns about your device.  If you have forgotten the steps you do to prepare yourself and prime and change your syringe, there is a video you can watch to assist if you would like.   You can go to:  [email or web address]  Please remember that the most important thing is hygiene!  Keep the area CLEAN.  Even if you don't shower every day, we want you to wash your abdomen with soap and water, followed by cleaning the area with the alcohol pad before you place the cannula.  Also remember that we want you to change your site every 24 hours.  Some of the nurses (who are great) may give you other directions based on FDA studies, but we want you to change the site daily to decrease your risk of infection.  If you don't have access to a computer to watch the above video, here is a written transcript of that video:  Before you administer VYALEV, you should be shown how to do it correctly by a healthcare provider.  This demonstration is intended to help you become familiar with the VYALEV delivery system and be used as a supplemental resource as you make infusing VYALEV part of your routine. Before you start VYALEV for the first time, and also each time you get a refill, please use the Medication Guide and the Instructions for Use materials that came with your drug vials, pump and other supplies from your Specialty Pharmacy. Use VYALEV exactly as your healthcare provider tells you to use it.  SUPER/VO: USE VYALEV is a prescription medicine used for treatment of advanced Parkinson's disease in adults. VYALEV contains two medicines, foscarbidopa and  foslevodopa.  SUPER: Please see Important Safety Information included in this video. Please see Instructions For Use, and full Prescribing Information, along with Medication Guide, which was provided with your pump.  SUPER: How to Use Your Delivery System  JANE: Hi! I'm Jane. I'm looking forward to helping you today.  (VO) JANE: I'd like to talk to you about using the [facility] Delivery System.  JANE: You were trained on how to use your system at the doctor's office. This video will help you remember what you learned.  You'll receive some Instructions for Use materials with the pump and supplies.  (VO) JANE: They explain the different supplies and how to use them, so read them over before beginning.  JANE: You'll also have your own dedicated VYALEV Complete Nurse Ambassador. They'll be your go-to person for support throughout your VYALEV treatment...  SUPER: Nurse Ambassadors are provided by [employer] and do not work under the direction of your health care professional (HCP) or give medical advice. They are trained to direct patients to their HCP for treatment-related advice, including further referrals.  JANE: ...and will visit you at home to help you practice.  SUPER: Your Nurse Ambassador will visit you at home  Nurse Ambassadors are provided by [employer] and do not work under the direction of your health care professional (HCP) or give medical advice. They are trained to direct patients to their HCP for treatment-related advice, including further referrals.  JANE: Learning your new VYALEV delivery system may take some time, but with practice and patience, you'll get  there.  JANE: First, we'll go over the 5 phases of setting up your infusion. You can remember them as the 5 Ps: Plan It Out, Prepare the Syringe, Prime the Tubing, Place the Cannula, and Pump the Medication.  SUPER: The 5 Ps Plan It Out Prepare the Syringe  Prime the Tubing Place the Cannula Pump the  Medication  JANE: First is the Plan It Out phase. Here you will check your supplies and get ready to begin.  SUPER: The 5 Ps  Plan It Out Prepare the Syringe Prime the Tubing  Place the Cannula Pump the Medication  JANE: Second is the Prepare the Syringe phase. In this phase, you transfer the medication from the vial to the syringe.  SUPER: The 5 Ps  Plan It Out Prepare the Syringe  Prime the Tubing  Place the Cannula Pump the Medication  JANE: The third phase is Prime the Tubing-- you'll fill the infusion tubing with the medication.  SUPER: The 5 Ps  Plan It Out  Prepare the Syringe Prime the Tubing  Place the Cannula Pump the Medication  (VO) JANE: The fourth is Place the Cannula. You'll put the cannula into the skin of your belly and connect the tubing.  SUPER: The 5 Ps  Plan It Out  Prepare the Syringe  Prime the Tubing  Place the Cannula  Pump the Medication  (VO) JANE: And fifth phase is Pump the Medication. You'll start the pump for your continuous infusion and use its carrying case.  SUPER: The 5 Ps  Plan It Out Prepare the Syringe Prime the Tubing  Place the Cannula Pump the Medication  JANE: Then, we'll go over instructions you'll need to follow when certain situations come up, like how to use the different settings on your pump, what you need to do when you shower, and more. At the end, we'll look at your [facility] Complete resources and talk about how they can help.  SUPER: The 5 Ps  Plan It Out  Prepare the Syringe Prime the Tubing  Place the Cannula  Pump the Medication  Additions to Your Routine VYALEV Complete Resources  JANE: Let's get started.  (VO) JANE: The first phase is to Plan It Out.  SUPER: Phase 1: Plan It Out  (VO) JANE: First, wash your hands thoroughly with soap and water for at least 20 seconds. Scrub the front and back of both hands, between your fingers, and under your nails. Dry them with a new paper  towel. We'll wash our hands and clean our workspace a few times as we go along.  SUPER: Wash hands thoroughly for 20 seconds  Scrub:  Front and back of hands Between fingers Under your nails Dry with new paper towel  JANE: This is called aseptic technique. That's just a medical term for making sure everything we touch stays as clean as possible. Keeping you and your workspace clean can help reduce the risk of infection and irritation at your infusion site.  SUPER: Aseptic technique = practicing your clean routine  (VO) JANE: Next, grab your medication vial. If it was in the fridge, be sure to let it sit at room temperature for 30 minutes before you use it. Don't warm the medication up any other way. Just remember to keep it out of direct sunlight.  SUPER: [icon] If refrigerated, let medication sit at room temperature for 30 minutes. [icon] It can be kept at room temperature for up to 28 days.  (VO) JANE: Next, we'll  make some space to work. You can use any flat surface. Be sure to thoroughly clean the area and dry it with a new paper towel.  SUPER: Clean your workspace  (VO) JANE: Next, gather your supplies. Then we'll check that they're safe to use. When gathering your supplies...  SUPER: Gather and inspect supplies  JANE: ...be sure to keep them in their packaging until you are ready to use them.  SUPER: Keep supplies in packaging until ready to use  (VO) JANE: Here's your pump, medication vial, vial adapter, and syringe. Your infusion set includes your tubing, cannula insertion device, and cannula all in the same package. You'll also want to grab a new paper towel and alcohol pads. The only other thing you'll need is a fully charged pump battery.  SUPER: Pump, Medication Vial, Vial Adapter, Syringe, Infusion Set, New Paper Towel, Alcohol Pads, Battery Optional: Consider using transparent film dressing to ensure the infusion set stays in place.  JANE: Be sure  that you have some extra supplies nearby in case you need them.  (VO) JANE: Take a look at the packaging on each of the supplies, and check for damage and expiration dates.  JANE: If something's damaged or expired, don't use it. Contact your Specialty Pharmacy for replacements. Inspect your medication vial, too. Be sure the vial reads "VYALEV."  SUPER: A Specialty Pharmacy is a type of pharmacy that handles medication for conditions like advanced Parkinson's. These medications often need special storage and processing.  JANE: The color might be different from the one we have here. That's okay.  JANE: But the solution should not be cloudy. You shouldn't see any particles or flakes floating in it. If the solution doesn't look right, don't use that medication vial.  SUPER: The medication vial... [X]  Should not be cloudy [X]  Should not have particles or flakes [X]  Should not be expired If the solution doesn't look right, don't use that vial  JANE: You'll also need to put a fully charged battery in your pump. Your pump comes with 2 batteries. Always keep one charging while the other is being used in your pump. This way, you'll have a fully charged battery ready when you need it.  SUPER: Refer to your Battery Charger Patient Instructions for Use for more information. Always keep one battery charging  (VO) JANE: Match the metal strips between the battery and battery compartment, and insert the battery. You'll hear a "CLICK" when the battery is in place. Once installed, you'll hear the pump make some start-up sounds as it gets ready for use.  (VO) JANE: Now, wash your hands a second time. We want to make sure our hands are as clean as possible before we use our supplies.  SUPER: Wash hands again with soap and water for 20 seconds  (VO) JANE: Dry them with a new paper towel.  SUPER: Dry with a new paper towel  (VO) JANE: That was phase 1--Plan It Out. Here's a quick summary.  Feel free to pause the video if you'd like to review the steps, or go back if you'd like to re-learn this phase.  SUPER: Let's review phase 1: Plan It Out  Wash your hands thoroughly Grab your medication vial. If it was refrigerated, let it sit at room temperature for 30 minutes Clean your workspace Gather and inspect your supplies Wash your hands thoroughly a second time (VO) JANE: The second phase is "Product Manager."  SUPER: Phase 2: Prepare the Syringe  JANE: In  this phase, you'll get your medication ready to use and place the filled syringe in the pump.  SUPER: Refer to your Instructions for Use of VYALEV.  (VO) JANE: Take off the medication vial's cap. Then, use one of your alcohol pads to clean the top of the vial. Give it 5 seconds to air-dry. This helps ensure everything stays as clean as possible.  SUPER: [Clock icon] Allow 5 seconds to dry  (VO) JANE: This is the vial adapter. It attaches to the top of your medication vial. It helps you transfer the medication from the vial into the syringe.  SUPER: Refer to your Vial Adapter Patient Instructions for Use.  (VO) JANE: Leave the vial adapter in the outer protective packaging. Peel back the cover, like this.  (VO) JANE: Hold the vial in one hand, on a flat workspace. Then, hold the adapter by the outer protective packaging in the other hand, and place it on top of the vial. Press down until you hear a SNAP sound. That's how you know you did it right!  JANE: Once you insert the vial adapter, the outer protective packaging can come off. The vial adapter will still be attached. You can throw the packaging away in your regular trash.  (VO) JANE: Take care not to touch the exposed end of the vial adapter.  SUPER: Don't touch exposed end of the vial adapter  (VO) JANE: The syringe is used to draw out the medication from the vial. Take the syringe out of its package.  (VO) JANE: Make sure you don't touch  the syringe tip to any unclean surfaces to help avoid contamination. If it does touch, throw it away and get a new one.  SUPER: If the syringe tip touches an unclean surface, you'll need to discard it and get a new one  (VO) JANE: Be certain the plunger is pushed in all the way. This makes sure all the air is out, so you can fill the syringe with your medication.  SUPER: Push plunger all the way in  (VO) JANE: Hold your syringe vertically, with the tip pointing down. Press the tip into your vial adapter to insert it. Turn it clockwise as you go.  SUPER: Don't overtighten  JANE: Once the syringe is inserted, flip it so the vial is on top, like this. Then, pull the plunger slowly all the way down. Withdraw the full contents of the vial into...  SUPER: Withdraw all the medication  (VO) JANE: ...the syringe to around the 12 mL mark. You will see air at the tip of the syringe.  SUPER: Withdraw all the medication 12mL  JANE: Next, check for large air bubbles. They may form when filling the syringe with the medication.  (VO) JANE: The tiny air bubbles may be hard to get out. That's okay. It's these larger ones you'll want to make sure to remove. They may affect your dosage and delay your medication's flow. Let's get rid of those bubbles.  SUPER: Check for large air bubbles  (VO) JANE: Slowly and gently rotate and tilt the syringe, like this. Do this a few times.  SUPER: Remove large air bubbles  (VO) JANE: You'll know you're doing it right when the air bubbles start to gather at the tip.  SUPER: Air bubbles will gather and disappear  (VO) JANE: Do not shake or tap the syringe to remove the air bubbles.  SUPER: Don't shake or tap the syringe  (VO) JANE: If the air bubbles won't  budge, hold the syringe vertically, with the tip facing down. Then rotate it so the tip is facing up. Gently turn it back and forth a few times.  JANE: You'll push out the air that  gathered here. Slowly press the plunger in. This will push the air out of your syringe and into the vial. Keep the syringe straight and try not to tilt it. This prevents another air bubble from forming. Continue pushing until all of the air is pushed out of the syringe and into the solution vial...  (VO) JANE: ...and there is solution visible in the syringe tip. You might feel some resistance. That's normal, keep going! Stop pressing the plunger when all the air is out.  (VO) JANE: Flip the syringe and vial so that the vial is right side up on the table. Now gently pull and turn the syringe counterclockwise to take it out. Be careful not to push the plunger or else the solution will leak. And that's it! The syringe is ready.  (VO) JANE: Make sure to lay the syringe down carefully, without touching the tip to any unclean surface. If it does touch, throw it away and get a new one.  SUPER: If the syringe tip touches an unclean surface, discard it and get a new one  JANE: Next, we'll connect the tubing, syringe, and pump together.  SUPER: Refer to your Infusion Set Patient Instructions for Use.  (VO) JANE: The infusion tubing is a long, thin tube that allows your medication to flow from the pump.  SUPER: Refer to your Infusion Set Patient Instructions for Use. Infusion tubing  (VO) JANE: It's packaged with your cannula insertion device. Take the tubing out of its package and remove the paper. You'll notice that your tubing has 2 different ends.  SUPER: Cannula insertion device  (VO) JANE: This end is called the site connector. It'll attach to the cannula on your belly.  SUPER: If the tubing ends touch an unclean surface, you'll need to discard it and get a new one. Site connector  (VO) JANE: This end is called the pump connector. It attaches to the syringe tip.  SUPER: If the tubing ends touch an unclean surface, you'll need to discard it and get a new one. Pump  connector  (VO) JANE: Hold your syringe with the tip pointing up. Take the pump connector end of the tubing. Attach it to the syringe by pressing and turning it clockwise.  (VO) JANE: Now we'll place the syringe inside your pump. Press any button to turn the pump display screen on. First press the MENU button. You'll see a few options. Highlight "Insert Syringe" and press "Select." Slide the latch to open the pump's lid.  SUPER: Refer to your VYALEV Pump Patient Instructions for Use.  (VO) JANE: Line up the syringe with the grooves inside the pump. The tip that's connected to your tubing will stick out. Close the lid. When you hear a SNAP sound--just like that! --it's fully closed and you're ready to keep going!  (VO) JANE: Click "Yes" to confirm the syringe is inside. The pump will prepare your medication for use!  (VO) JANE: If the syringe does not fit inside the pump, check to see if the syringe plunger rod pusher in the pump has moved to the correct position. Or check to see if the air or headspace has been removed from the syringe.  SUPER: Check the syringe plunger rod pusher Check that the air or headspace has been  removed  (VO) JANE: That's all for phase 2--Preparing the Syringe. Here's a quick summary. Again, feel free to pause the video if you'd like to review the steps, or go back if you'd like to re-learn this phase.  SUPER: Let's review phase 2: Prepare the Syringe  Use the vial adapter to transfer medication from the vial to the syringe Connect the tubing to the syringe Place the filled syringe in your pump. Follow the on-screen prompts to confirm the syringe is in (VO) JANE: Next, we'll prime the tubing. But before I show you how to prime...  SUPER: Phase 3: Prime the Tubing  (VO) JANE: ...I'll explain what it means and why it's important. Priming is when you fill the infusion tubing with your medication, before connecting it to your body.  SUPER: Refer to  your VYALEV Pump Patient Instructions for Use. Priming = filling new tubing with medication  (VO) JANE: A small amount of medication will flow through the tubing, out of the site connector and onto a new paper towel. You'll need to prime whenever you use new tubing. Let's take a look at how to do it.  (VO) JANE: Press any button to turn on your pump. The display screen will ask if you'd like to prime the tubing. Click "Yes." The display screen will then ask you to confirm that the tubing is not connected to the cannula. It should not be connected when priming. Click "Confirm."  (VO) JANE: We just connected one end of the tubing to the syringe. The other end of the tubing is called the "site connector." Take off the site connector's cover by squeezing the sides and sliding it off.  SUPER: Site connector (graphic circle/line to accompany this)  JANE: Keep it in a safe place where it'll stay clean. You'll need to cover the site connector if you disconnect from the pump for any reason, like to shower. Then, place the site connector on a new paper towel.  SUPER:  Keep it in a safe place where you will remember it It's used again when you disconnect from the pump (VO) JANE: Now, hold the pump with the syringe tip facing up, and press "Prime" once. If the pump is not straight up, the "Prime" option will not be available.  SUPER: Must hold pump straight up to prime  (VO) JANE: Each time you press "Prime", medication will push through the tubing from the syringe toward the site connector end.  (VO) JANE: Press the button once, and wait to see a drop of medication on the paper towel. If you don't see it after a few moments, press "No" on the pump to prime again.  (VO) JANE: The medication will eventually reach the end of the tubing and drip onto the paper towel. That means you're doing it right!  JANE: Your tubing is now primed. Press "Yes." Lay your pump flat on the table. Wait at least  1 minute to make sure the solution stops dripping from the site connector.  SUPER: [Clock icon] Wait 1 minute  (VO) JANE: Then, without lifting the site connector from the paper towel, gently tap the tubing right where it meets the site connector to shake off any drops of medication.  JANE: If we don't shake the droplets off, they could make your site connector sticky and a bit difficult to remove from the cannula. We'll learn more about that later on.  JANE: That's it! The tubing is primed and ready. Now, it's important to  remember: you do not need to do this every time you change a syringe. Only prime when you are changing the tubing. Your tubing should be changed every time you change your cannula.  SUPER: Only prime when changing the tubing  (VO) JANE: And we're done with phase 3--Priming the Tubing. Here's a quick summary. Feel free to pause the video if you'd like to review the steps, or go back if you'd like to re-learn this phase.  SUPER: Let's review phase 3: Prime the Tubing  Lay the site connector on a new paper towel Use your pump to prime. You'll do this whenever you use new tubing (VO) JANE: Next, we'll insert the cannula.  SUPER: Phase 4: Place the Cannula  JANE: The cannula is inside this insertion device, which helps you put it into the skin of your belly. The tubing will attach to the cannula so the medication can be delivered under the skin.  (VO) JANE: For demonstration purposes, I'll show you how to insert the cannula using a foam belly. But before that, we need to choose a spot to insert the cannula. This spot is called the "infusion site." The preferred infusion site is on your belly. In some cases, your doctor may recommend inserting the cannula into another part of the body. Talk to your doctor about what's right for you.  SUPER: The belly is the preferred infusion site. Speak to your doctor about what infusion site is right for you. Preferred Infusion  Site  JANE: Avoid skin that doesn't look normal. You'll also want to avoid areas where clothing might get in the way, like your waist. Choose sites with the least risk of being impacted by physical activity, to prevent your cannula from being knocked out. If needed, remove hair around the insertion site. This will make sure the tape on the cannula stays secure.  SUPER: Choose an infusion site 2 inches away from skin that  Is tender, bruised, red or hard to the touch Naturally bends, creases or has folds Has significant sweat Might interfere with clothing and cause irritation Has body hair. Remove any body hair before inserting the cannula (VO) JANE: Insert the cannula at least 2 inches away from your belly button. The distance from your fingertip to your knuckle is about 2 inches. Use that as a guide when placing your cannula.  SUPER: 2"  JANE: Rotate the infusion site by choosing an area that is at least 1 inch from previous sites used in the last 12 days.  SUPER: At least 1 inch away from previous sites used in the last 12 days Rotate your infusion site at least once every 3 days, or more frequently as directed by your doctor.  (VO) JANE: The distance from the tip of your thumb to your knuckle is about 1 inch.  SUPER: 1"  (VO) JANE: Wash the infusion site with soap and water.  SUPER: Wash infusion site with soap and water  (VO) JANE: Then, use an alcohol pad to wipe the insertion area in an outward spiral, not back and forth, to avoid moving bacteria onto the site. Do not blow on it.  SUPER: Wipe your skin  JANE: Instead, let it air-dry for at least 1 minute so that the tape on the cannula can stick to the skin properly.  SUPER: [icon] Wait 1 minute to air-dry  (VO) JANE: The cannula insertion device will help you put the cannula in the skin of your belly. When you press this  button at the top, it'll insert the cannula under your skin.  SUPER: Refer to your  Infusion Set Patient Instructions for Use.  JANE: Right now, the button has this safeguard on it. This makes sure we don't accidentally press the button before we're ready to insert the cannula.  (VO) JANE: The arrows running down the side indicate where the tubing will be connected after insertion.  (VO) JANE: On the bottom there's a piece of paper covering up tape. It's like the paper that covers the tape on a bandage. Let's see how it works!  (VO) JANE: First, remove the paper from the bottom to expose the adhesive tape. Gently pull it in a circular motion so the tape doesn't stick to itself.  (VO) JANE: Next, take off the safeguard at the top. Gently squeeze the sides and pull it straight out.  JANE: Keep the safeguard in a clean, safe place, with the site connector cover we removed before. You'll need both when you disconnect from your pump.  SUPER: Store in a clean, safe place  (VO) JANE: Note the arrows on the insertion device. This shows the side of the cannula where the tubing will be connected.  (VO) JANE: With the insertion device in one hand, use the other to gently stretch your skin. This will create a taut, flat surface. Then, hold the insertion device against your skin. Now press this red button down completely to insert the cannula. It will make a "CLICK" sound as it's inserted. Just like that!  (VO) JANE: Keep the device pressed against your skin for 5 seconds to make sure the tape sticks. Then, carefully pull the insertion device away from your body.  SUPER: [Clock icon] Press for 5 seconds  JANE: The cannula and the tape will stay on your skin. Press the tape down to make sure it's secure. Then, dispose of the insertion device properly in a sharps disposal container.  SUPER: A sharps container is a recycling bin for used needles and other medical supplies.  JANE: Even if you unsuccessfully insert the cannula, don't reinsert in the same site again or reuse  the same cannula. Get a new, unused insertion device and place the new cannula in a different location. Be sure to use a new insertion device at a new infusion site if the adhesive tape becomes loose or if it needs to be readjusted or replaced.  (VO) JANE: Now we'll connect the tubing to the cannula. We'll do this by inserting the site connector end into the cannula that we just placed on your belly. Place a finger on the cannula housing and push the site connector in. You'll know you inserted it fully when you hear a CLICK sound. This is important to help prevent any medication from leaking out.  JANE: Now your [facility] delivery system is fully connected! Be sure to frequently check your infusion site to ensure the cannula remains in place, that fluid isn't leaking onto the skin and that the site is not irritated. If you experience any redness, pain or swelling, be sure to replace the cannula in a new infusion site and call your doctor.  SUPER: Check infusion site often  Cannula stays in place No leaking No irritation JANE: You can change your cannula as often as you need to. But it should be changed at least every 3 days or more frequently as directed by your doctor.  SUPER: [icon] Replace your tubing and cannula at least once every 3 days, or  more frequently as directed by your doctor  (VO) JANE: That's it for phase 4: Placing the Cannula. Here's a quick summary. Feel free to pause the video if you'd like to review the steps, or go back if you'd like to re-learn this phase.  SUPER: Let's review phase 4: Place the Cannula  Choose an infusion site on your belly Prepare the site Place the cannula with the cannula insertion device Attach the tubing to the cannula on your body (VO) JANE: We made it to the last phase--starting and using your pump.  SUPER: Phase 5: Pump the Medication  (VO) JANE: Press any button to turn your pump on. Press "Continue". Then press YES. The screen will  time out after 20 seconds of inactivity. But the pump won't turn off unless the battery is removed. Your pump will now start doing its job--giving you your medication.  SUPER: Refer to your [facility] Pump Patient Instructions for Use.  (VO) JANE: The pump screen will say "Running" in green at the top corner when the medication is flowing. That's how you know you did it right! Your continuous infusion is in progress. Your medication will flow until your syringe runs out.  SUPER: Pump status  (VO) JANE: The screen will also show a countdown of the time left until you need to replace the syringe with new medication. The time shown is dependent on the dose your doctor prescribed.  JANE: Keep in mind, once the medication is taken out of the vial, it must be used within 24 hours, or thrown away.  SUPER: Medication must be used and syringe must be replaced within 24 hours.  JANE: Check on the cannula often. It should not be loose or leaking. The skin around the infusion site shouldn't be irritated. If you're having a skin reaction or irritation, remove the cannula and call your doctor. If your doctor advises to change your cannula, be sure to use a new site at least 2 inches away from the area of irritation.  (VO) JANE: You also received this carrying case with your pump. Your pump goes in the case, and the strap allows you to wear it throughout your day.  SUPER: Refer to your [facility] Carrying Accessory Patient Instructions for Use.  (VO) JANE: Unzip the case and place your pump inside, with the buttons facing out so you can see them through the plastic window.  (VO) JANE: Make sure the syringe tip is lined up with the opening on the side of the case. Now you can zip it up! When wearing your pump, make sure that you don't leave the infusion tubing hanging loose. It could get caught on something and pull out your cannula. Also check that the tubing isn't kinked or folded so the medication's  flow isn't blocked.  (VO) JANE: That's it for phase 5: Pump the Medication. Here's a quick summary. Feel free to pause the video if you'd like to review the steps, or go back if you'd like to re-learn this phase.  SUPER: Let's review phase 5: Pump the Medication  Follow the screen's prompts to start your pump Check the screen to confirm when the syringe is to be replaced Place your pump in its carrying case JANE: There are a few more things to go over. But before we move on, let's summarize the 5 Ps. First is the Plan It Out phase: the things you need to do before you use your supplies. Second, Prepare the Syringe. Transfer the medication from  the vial into the syringe. Third, Prime the Tubing. Fill the infusion tubing with your medication, before connecting it to your cannula. Fourth, Place the Cannula. Choose and clean a spot on your skin to insert the cannula and connect the tubing. And lastly, Pump the Medication. Start the pump, and the medication will flow. That's it for the 5 Ps!  SUPER: Continuous Infusion phases: The 5 Ps  Plan It Out  Prepare the Syringe Prime the Tubing  Place the Cannula Pump the Medication  (VO) JANE: You know how to set up your continuous infusion! Now let's go over a few more things.  SUPER: Additions to Your Routine  JANE: Like what to do when showering, how to get an extra dose, how to replace supplies, and more.  JANE: First we'll learn how to stop and restart your pump. Then, we'll talk about how to replace your syringe. We'll also look at how to disconnect and reconnect your pump. There are different sets of instructions for reconnecting, depending on if you've been disconnected for under 1 hour, or over 1 hour. Then, we'll look at how to replace the syringe, infusion tubing and cannula, which you'll do regularly. Next, we'll talk about changing the medication's flow rate, delivering an extra dose, and delivering a loading dose. Last, we'll learn  about the different alarms and messages your pump may give you.  SUPER: Additions to Your Routine:  Stop and restart your pump Replace syringe only Disconnect and reconnect, if under 1 hour Replace infusion tubing and cannula, if over 1 hour Replace syringe, infusion tubing and cannula Flow Rate, Extra Dose and Loading Dose Pump alarms and messages (VO) JANE: You'll stop and restart your pump whenever you replace your supplies...  SUPER: How to stop and restart your pump  JANE: ...or disconnect from the delivery system. But remember, if you stop your infusion for more than 1 hour, be sure to replace the cannula and infusion tubing, or they could get blocked up. We'll go over how to do that later on.  (VO) JANE: To stop your pump, first turn on the pump display screen by pressing any button. Then press MENU. Highlight STOP PUMP and press SELECT. The screen will ask you to confirm that you want to stop the pump. Press YES.  (VO) JANE: It'll stop your medication's flow. If done correctly, the pump will say "STOPPED" in red on the top right corner.  SUPER: Pump status  (VO) JANE: When you're ready to restart the pump again, press any button to turn the pump screen on. Press MENU, then highlight START PUMP. Press SELECT, then YES to confirm you want to start the pump again. That's it!  (VO) JANE: Now we'll go over how to replace the syringe, but keep the same cannula and tubing you're already using.  SUPER: How to replace the syringe only  JANE: You'll need to change the syringe in your pump at least once every 24 hours. You may have to change it more often because the syringe is empty or nearly empty, depending on the dose prescribed by your healthcare provider. Before we get started, I'll share a few important notes. Since you're using the same tubing as before, you won't need to prime it.  SUPER: Syringe must be changed at least every 24 hours It's okay if there's some  medication left in your syringe when it's time to replace it  (VO) JANE: You'll want to start replacing your syringe about 30 minutes before  the medication in your current syringe is due to run out.  SUPER: Start preparing a new syringe at 30 minutes  JANE: Check the countdown on your pump's screen. This way, you won't have to go without your medication while you get the next syringe ready.  JANE: First, follow the Plan It Out phase instructions we went over at the start of this video.  SUPER: Follow the Plan It Out phase  (VO) JANE: That includes washing and drying your hands, getting your workspace ready, gathering and inspecting your supplies, and washing your hands a second time.  SUPER:  Plan It Out:  Wash your hands for at least 20 seconds and dry with a new paper towel Get your workspace ready Gather and inspect your supplies Wash and dry your hands a second time JANE: The only difference this time, is that we won't be needing a few things. So when you gather your supplies, you'll only need your pump, syringe, medication vial, vial adapter, new paper towels, and alcohol pads.  SUPER: You'll need:  Pump Syringe Medication vial Vial adapter New paper towels Alcohol pads (VO) JANE: Follow the steps we went over during the Prepare the Syringe phase earlier.  SUPER: Follow the Prepare the Syringe phase  JANE: This includes cleaning the vial top with an alcohol pad. In this step, you'll transfer your medication into your syringe.  JANE: Turn on your pump display screen by pressing any button. Press MENU to see a few options. Use the arrow keys to highlight CHANGE SUPPLIES. Press SELECT, then press YES. It will stop so you can replace the syringe. Follow the prompts to see the REMOVE SYRINGE menu. Press SELECT. Your pump will get the syringe ready to be removed. When it's done, the display screen will let you know and then prompt you to open the lid.  (VO)  JANE: Slide the latch, and take out the old syringe. If you do not tell your pump that you're replacing the syringe, your new syringe will not fit.  JANE: Take the tubing off the old syringe. Attach it to the new syringe tip by pressing and turning it clockwise.  (VO) JANE: Now we'll place the new syringe inside your pump. Match the grooves and leave the tip out. Make sure it's in place, and close the lid. You'll hear a SNAP when it's completely shut. That's how you know you did it right.  (VO) JANE: If the syringe doesn't fit inside the pump groove, check to see if the syringe plunger rod pusher is advanced to the correct position and air has been removed. Be sure to discard the used syringe according to your local guidelines.  (VO) JANE: The pump display screen will ask if the new syringe is in. Click YES, and it'll prepare the new syringe for use. The screen will let you know when it's ready, and will ask if you want to prime again. Click NO, since we didn't replace the tubing. Make sure everything else is still connected. Then click YES to start the pump. That's it!  (VO) JANE: Next we'll look at how to disconnect from your pump and then reconnect.  SUPER: Disconnecting and reconnecting if it's been under 1 hour  JANE: Only use these instructions when you want to disconnect for less than 1 hour. This includes when you want to shower or swim. You can't get in the water with your pump or tubing. They cannot get wet.  SUPER: Reconnecting after less than 1  hour  (VO) JANE: Let's take a look at how to disconnect. First, turn on the pump screen by pressing any button. Then press the MENU button. Highlight STOP PUMP. Click YES.  (VO) JANE: Hold the cannula housing so it doesn't move. Take the site connector end of your tubing, and gently squeeze the sides. Pull it straight out.  (VO) JANE: Once the site connector is removed, lay it on a new paper towel. Gently tap the tubing at the  site connector end. This will shake off any drops of medication, so it doesn't get sticky.  (VO) JANE: Wait 60 seconds. Then, to protect the site connector, replace the white cover that came with it.  SUPER: [Clock Icon] Wait 60 seconds  (VO) JANE: And to protect yourself, attach the clean, transparent cannula cover, called the safeguard, that came with the insertion device. Insert the safeguard into the cannula. You'll hear a CLICK! noise when it's fully inserted. Keep the tubing and pump in a clean, dry place.  JANE: Keep track of the time after you disconnect. There are different ways to reconnect depending on how long it's been since you disconnected.  SUPER: Make note of time you disconnect. There are different ways to reconnect depending on whether you've been disconnected under or over one hour.  JANE: Here's how to reconnect your pump and tubing if it's been less than an hour since disconnecting.  SUPER: [Clock Icon] Less than 1 hour  (VO) JANE: Remove the safeguard that's plugging your cannula. Grab your tubing and take off the site connector's cover. Now place a finger on the top of the cannula housing and push the site connector straight into the cannula, like this. You'll hear another "CLICK" when it's in.  (VO) JANE: Grab your pump. Turn on the screen by pressing any button. Then press the MENU button. Select START PUMP from the options list. Press YES. That's it! The pump will continue your infusion and count down the time left on your syringe.  (VO) JANE: Before we move on, let's see what to do if you're trying to disconnect but your site connector is hard to remove from your cannula. Sometimes this can happen if the medication makes it sticky.  SUPER: What to do if your site connector is stuck  (VO) JANE: First, run a clean cloth under really warm water until it's dripping wet.  SUPER: If your site connector is stuck:  Run a clean cloth under warm  water (VO) JANE: Let the wet cloth soak on the site connector for at least 2 minutes to help dissolve the sticky medication. Gently rub the cloth, too.  SUPER: If your site connector is stuck:  Run a clean cloth under warm water Let it soak on the site connector for 2 minutes. Gently rub the cloth (VO) JANE: Then, squeeze the sides of the site connector and try to pull it out.  SUPER: If your site connector is stuck:  Run a clean cloth under warm water Let it soak on the site connector for 2 minutes. Gently rub the cloth Squeeze the sides of the site connector and try to pull it out (VO) JANE: If it's still stuck, try soaking and applying the cloth again. If that doesn't work, remove the cannula with the tubing attached. Replace it with a new cannula and tubing.  SUPER: If it's still stuck, try soaking and applying the cloth again. If that doesn't work:  Remove the cannula with the tubing attached Replace  it with a new cannula and new tubing If you need further help, call your doctor.  (VO) JANE: You will have to replace your cannula and tubing if you've been disconnected from the pump for over an hour...  SUPER: Replacing tubing and cannula (not syringe) if you've been disconnected for over 1 hour  JANE: ...or whenever you replace your cannula. Now let's take a look at how to disconnect your cannula and infusion tubing. Then we'll reconnect new ones. We won't be replacing the syringe.  JANE: First, follow the Plan It Out phase that we went over at the start of this video.  SUPER: Follow the Plan It Out phase  (VO) JANE: That includes washing and drying your hands, getting your workspace ready, gathering and inspecting your supplies, and washing your hands a second time.  SUPER: Plan It Out:  Wash your hands for at least 20 seconds and dry with a new paper towel Get your workspace ready Gather and inspect your supplies Wash and dry your hands a second  time JANE: Gather your supplies--including the infusion set, alcohol pads, and new paper towels.  SUPER: Gather new supplies:  Infusion set Alcohol pads New paper towels (VO) JANE: First, turn on your pump display screen by pressing any button. Press MENU. If your pump is running, press SELECT to choose the STOP PUMP option. Then press YES.  (VO) JANE: Then take the pump connector end of the tubing off the syringe tip. Turn it counterclockwise, and pull it off.  (VO) JANE: Now, take out the cannula with the tubing still attached. Carefully loosen the tape from your skin, like you would when pulling off a bandage. Then pull the cannula housing away from your body to take it out.  JANE: Once it's removed, check the cannula and infusion site to ensure no parts of the cannula were left behind.  SUPER: Dispose of these supplies properly based on your local guidelines.  JANE: If you think the plastic part of the cannula was detached from the adhesive and is still under your skin, call your healthcare professional.  SUPER: Contact your healthcare professional if you notice any skin changes at the infusion site, such as the spreading of redness, swelling, warmth, pain or discoloration when pressure is applied to the area.  (VO) JANE: Next, attach your new tubing to the syringe tip. Twist clockwise until it's snug. Don't twist it too tight, though! Since you're using new tubing, you'll need to prime it, like we did earlier.  (VO) JANE: Select MENU on your pump. Use the arrow keys to highlight CHANGE SUPPLIES and press SELECT. Highlight PRIME INFUSION LINE. Press SELECT, and CONFIRM.  (VO) JANE: To finish priming the tubing, follow the steps we went over earlier in the Prime the Tubing phase. Don't forget to tap the site connector and wait 60 seconds to avoid a sticky connector. Next, you'll insert a new cannula into the skin of your belly and connect your tubing.  SUPER: [check box]  Follow the Prime the Tubing phase  (VO) JANE: To do this, follow the steps we went over earlier in the Place the Cannula phase. Be sure to dispose of your insertion device properly in a sharps disposal container. Remember to choose an infusion site that is at least 1 inch from previous sites used in the last 12 days.  SUPER: [check box] Follow the Prime the Tubing phase [check box] Follow the Place the Cannula phase  (VO) JANE: After that, you're  ready to start the pump again. The display screen will ask you to do this after you connect the tubing to the cannula. Press CONTINUE and then YES.  (VO) JANE: Now let's take a look at how to replace your syringe, infusion tubing, and cannula.  SUPER: Replacing the syringe, infusion tubing and cannula  JANE: Your pump will let you know when to change your syringe. You should start this process about 30 minutes before your medication is due to run out.  SUPER: Start preparing a new syringe at 30 minutes  JANE: Check the countdown on your pump's screen. This way, you won't have to go without your medication while you get the next syringe ready.  JANE: First, follow the Plan It Out phase we went over at the start of this video.  SUPER: Follow the Plan It Out phase  JANE: That includes washing and drying your hands, taking your medication vial out and letting it sit at room temperature for 30 minutes if it was refrigerated, getting your workspace ready, gathering and inspecting your supplies, and washing your hands a second time.  SUPER: Plan It Out:  Wash and dry your hands If refrigerated, let your medication sit for 30 minutes before use Get your workspace ready Gather and inspect your supplies Wash and dry your hands again JANE: Then, follow the steps we went over during the Prepare the Syringe phase. In this step, you'll get your medication ready for use in your syringe.  SUPER: Follow the Prepare the Syringe phase  (VO)  JANE: Next, stop the pump. Turn on the screen by pressing any button. Press MENU, then SELECT to see the STOP PUMP menu. Click YES. You should see a red "STOPPED" in the upper right corner.  (VO) JANE: Now we'll take out the cannula with the tubing still attached. Carefully loosen the tape from your skin, like you would when pulling off a bandage. Then pull the cannula housing away from your body to pull it out.  (VO) JANE: Once it's removed, inspect the cannula and infusion site to ensure no parts of the cannula were left behind.  SUPER: If you think the plastic part of the cannula is still under your skin, call your doctor.  (VO) JANE: You'll need to take the syringe out of the pump. Press MENU and highlight CHANGE SUPPLIES. Follow the prompts to see the REMOVE SYRINGE menu. Press SELECT.  (VO) JANE: Your pump will now get the syringe ready to be removed. When it's done, the screen will then ask you to open the lid. Slide the latch and take out the old syringe. Dispose of your used syringe and infusion set properly based on your local guidelines.  (VO) JANE: The next few steps are all things we've done before. You'll have to connect new tubing to the tip of your new syringe, and place the new syringe in your pump.  SUPER: [check box] Follow the Prepare the Syringe phase [check box] Follow the Prime the Tubing phase [check box] Follow the Place the Cannula phase  (VO) JANE: You'll need to prime the tubing, since we're using a new one.  (VO) JANE: Remember to wipe the alcohol pad in an outward spiral, not back and forth, over your infusion site.  (VO) JANE: Insert the new cannula, and make sure to stay at least 1 inch away from any previous sites used in the last 12 days.  (VO) JANE: Then insert the site connector.  (VO) JANE: The last  step is to start your pump. After you connect the tubing, the screen will ask if you want to start. Press CONTINUE and then YES. That's it! Now  you know how to use your delivery system when different situations come up.  (VO) JANE: Now we'll talk about the infusion dose options you may have in your pump's menu.  SUPER: Flow Rate, Extra Dose and Loading Dose  JANE: Dose options are different ways to change the amount of medication being infused. But keep in mind, you may not be able to use these options on your pump. It depends on how your doctor programmed it. If you're not sure, give your doctor a call.  SUPER: Call your doctor if you have questions about infusion dose options. Refer to your [facility] Pump Patient Instructions for Use.  (VO) JANE: The first option is to change your medication's flow rate. The flow rate determines the amount of time one syringe is used and how much medication you're receiving. Here's how you do it. Go to MENU. Then select "Change Rate."  SUPER: Flow Rate = How fast your dose is administered  (VO) JANE: The second option is to deliver an extra dose of medication. That means you'll get a small amount of medication infused over a short amount of time.  (VO) JANE: The third option is to deliver a loading dose of medication. A loading dose is a larger dose of your medication that flows more quickly than a typical dose. This is used when you first use your pump or when your pump has been off for at least 3 hours, if enabled by your doctor. They'll set a lockout time for how often a loading dose can be administered.  JANE: If these options are available for you, ask your doctor how and when you should use them. You can also refer to the Instructions for Use that came with your pump for more information.  SUPER: Call your doctor if you have questions about infusion dose options.  (VO) JANE: Let's talk about the different messages and alarms your pump will give you.  SUPER: Pump alarms and messages  JANE: These are intended to alert you that you may need to do something. Here are some examples of  alarms and messages you may see on your pump.  (VO) JANE: Your pump will alert you when you have 2 hours left until your syringe runs out, and again when you have 45 minutes left on your syringe.  JANE: At this point, you'll want to remove your next medication vial from the fridge.  SUPER: If refrigerated, remove 30 minutes before use  (VO) JANE: It'll alert you if your syringe is empty and needs to be replaced. It'll also let you know when your syringe has been in the pump for 24 hours.  JANE: Remember, the syringe and any unused medication should be thrown away after 24 hours.  SUPER: Your syringe must be disposed of every 24 hours. It's okay if there's some medication left in your syringe when it's time to replace it.  JANE: There are many other types of alarms and messages that your pump may show you. Each is considered either "high" or "low" priority.  JANE: Alarms that are "high priority" mean that your pump has stopped, and you'll need to take action immediately. You'll see a red caution symbol on your pump display screen for a high priority alarm. You'll also hear a sound. Press "OK" on your pump to confirm you  heard the alarm, and the sound will stop. Then, refer to your Pump Patient Instructions for Use to see how to fix the problem.  SUPER: High priority = take action now  JANE: Alarms that are "low priority" mean that you need to do something soon to prevent a "high priority" alarm later on. You'll see a yellow caution symbol on your pump display screen for a low priority alarm. Each alarm is given for a particular reason and has its own unique sound.  SUPER: High priority = take action now  Low priority = there may be a problem soon  JANE: For a full list that describes what each alarm means and what you should do when they pop up, look at your pump's Instructions for Use. Don't interact with your delivery system while you're operating motorized vehicles or  machinery, or otherwise engaging in activities where distractions need to be avoided.  SUPER: Find a full list of pump alarms and messages in your VYALEV Pump Patient Instructions for Use.  JANEBETHA Frohlich finished! That was a lot to learn. You can practice with your Nurse Ambassador until you feel prepared to do this at home.  SUPER: Practice with your Nurse Ambassador until you feel prepared  JANE: When challenges pop up, your [facility] [employer] dedicated support will be there to help you get back on track. Your Nurse Ambassador is happy to answer any questions as you go along.  JANE: You can call the [employer]EV Complete 24/7 Hotline any time, even when your Nurse Ambassador is not available. The Hotline is there whenever you need it.  SUPER: Call the VY[employer]EV Complete 24/7 Hotline at [phone] The 24/7 Hotline is staffed by nurses that are specifically trained on VYALEV and can be reached 24 hours per day, 7 days a week. After business hours, you may be asked to leave a message for a return call within 30 minutes.  JANE: Remember, you can rely on [facility]EV [facility]. We're always here to help.  SUPER: [employer]EV Complete 24/7 support

## 2024-03-25 NOTE — Progress Notes (Unsigned)
 Assessment/Plan:   1.  Parkinsons Disease  - Stop all oral levodopa .  -- Loading dose: *** mL on day 1 of implementation and if off infusion for > 3 hours  - Base rate: *** mL/h  - Low rate: *** mL/h  - High rate: *** mL/h  - Extra dose:: *** mL   The following were discussed with the patient:  - Administer in the abdomen, avoiding a 5-cm radius area from the navel. - Rotate the infusion site and use a new infusion set daily - Administer using the patient own Vyafuser  pump and consumables provided by the company - The medication vials are for single use only. - Discard the syringe and any unused med in the syringe after it has been in the syringe for 24 hours.   2.  Insomnia  -restart trazodone , 50 mg nightly.  Currently with day/night reversal.  3  LBP  -She does have moderate to severe spinal stenosis at L4-L5 and is following with orthopedics, Dr. Duwayne and Dr. Bonner, for this.  She has also seen [facility] orthopedics  4.  eye opening apraxia  -discussed botox along with r/b/se.  She was approved for xeomin  and is awaiting for pharmacy shipment    Subjective:   Marissa Garcia was seen today in follow up for Parkinsons disease.  My previous records were reviewed prior to todays visit as well as outside records available to me.   Patient with her daughter who supplements hx. patient presents today for Vyalev initiation.  Current prescribed movement disorder medications: carbidopa /levodopa  25/100, 1 at 9:30 am when awakens before breakfast and 1 right after breakfast at 10:30 am/2 tablets at 12:30pm/1 tablet at 3-4 PM/1 at 6pm-7 PM.  May take extra prn, when going to dance, etc  Carbidopa /levodopa  50/200 CR at bedtime Trazodone , 50 mg nightly   PREVIOUS MEDICATIONS: pramipexole  (EDS); propranolol  (bradycardia and low blood pressure); rotigotine  patch (insurance denied - samples helped); escitalopram  (helped, but when she ran out she felt mood was good and did not want to  restart); trazodone ; entacapone  (prescribed but she never took it); ropinirole  (taken off because of confusion)  ALLERGIES:   Allergies  Allergen Reactions   Propofol     CURRENT MEDICATIONS:  Outpatient Encounter Medications as of 03/26/2024  Medication Sig   carbidopa -levodopa  (SINEMET  CR) 50-200 MG tablet TAKE 1 TABLET BY MOUTH AT BEDTIME   carbidopa -levodopa  (SINEMET  IR) 25-100 MG tablet 2 in the AM at 9am/2 tablets at 12:30pm/1 tablet at 3-4 PM/1 at 6pm-7 PM; extra prm (Patient not taking: Reported on 03/17/2024)   ciclopirox (PENLAC) 8 % solution Apply topically at bedtime. Apply over nail and surrounding skin. Apply daily over previous coat. After seven (7) days, may remove with alcohol and continue cycle.   incobotulinumtoxinA  (XEOMIN ) 50 units SOLR injection Inject 50 units into the head and neck every 90 days per Dr. Evonnie (Patient not taking: Reported on 03/17/2024)   meloxicam  (MOBIC ) 15 MG tablet Take 1 tablet (15 mg total) by mouth daily.   traZODone  (DESYREL ) 50 MG tablet Take 1 tablet (50 mg total) by mouth at bedtime.   [DISCONTINUED] zolpidem  (AMBIEN ) 5 MG tablet Take 1 tablet (5 mg total) by mouth at bedtime as needed for sleep. (Patient not taking: No sig reported)   Facility-Administered Encounter Medications as of 03/26/2024  Medication   incobotulinumtoxinA  (XEOMIN ) 50 units injection 50 Units    Objective:   PHYSICAL EXAMINATION:    VITALS:   There were no  vitals filed for this visit.    GEN:  The patient appears stated age and is in NAD. HEENT:  Normocephalic, atraumatic.  The mucous membranes are moist. The superficial temporal arteries are without ropiness or tenderness.  Seen at 9am and hasn't taken medication yet.    Neurological examination:  Orientation: The patient is alert and oriented x3. Cranial nerves: There is good facial symmetry with min facial hypomimia. The speech is fluent and clear (she does speak English most of the time). Soft palate  rises symmetrically and there is no tongue deviation. Hearing is intact to conversational tone. Sensation: Sensation is intact to light touch throughout Motor: Strength is at least antigravity x4.  She was seen at 8:30 am and took medication about 5 min ago  Movement examination: Tone: There is mild increased tone in the bilateral UE Abnormal movements: there is mild tremor bilateral UE and RLE Coordination:  There is mild decremation on the L Gait and Station: The patient has no difficulty arising out of a deep-seated chair without the use of the hands. The patient's stride length is good with good but purposeful arm swing  I have reviewed and interpreted the following labs independently    Chemistry      Component Value Date/Time   NA 137 11/02/2022 0014   NA 139 03/25/2019 1553   K 3.5 11/02/2022 0014   CL 103 11/02/2022 0014   CO2 27 11/02/2022 0014   BUN 18 11/02/2022 0014   BUN 8 03/25/2019 1553   CREATININE 0.77 11/02/2022 0014   CREATININE 0.64 08/24/2015 1134      Component Value Date/Time   CALCIUM 9.4 11/02/2022 0014   ALKPHOS 56 10/13/2018 2239   AST 32 10/13/2018 2239   ALT 23 10/13/2018 2239   BILITOT 0.6 10/13/2018 2239       Lab Results  Component Value Date   WBC 7.0 11/02/2022   HGB 12.4 11/02/2022   HCT 36.5 11/02/2022   MCV 91.0 11/02/2022   PLT 260 11/02/2022    Lab Results  Component Value Date   TSH 1.060 08/25/2019     Total time spent on today's visit was *** minutes, including both face-to-face time and nonface-to-face time.  Time included that spent on review of records (prior notes available to me/labs/imaging if pertinent), discussing treatment and goals, answering patient's questions and coordinating care.  Cc:  Marissa Garcia ORN, NP

## 2024-03-26 ENCOUNTER — Ambulatory Visit: Admitting: Neurology

## 2024-03-26 VITALS — BP 118/72 | HR 68 | Ht 62.0 in | Wt 140.0 lb

## 2024-03-26 DIAGNOSIS — G20B2 Parkinson's disease with dyskinesia, with fluctuations: Secondary | ICD-10-CM | POA: Diagnosis not present

## 2024-03-26 NOTE — Procedures (Signed)
 Vyalev pump set up today and established subcutaneous infusion site. - Loading dose: 1.10 mL on day 1 of implementation and if off infusion for > 3 hours.  Patient was given loading dose today. - Base rate: 0.25 mL/h (1020 mg levodopa  per day) - Low rate: 0.23 mL/h  - High rate: 0.28 mL/h  - Extra dose:: 0.15 mL (25.5 mg levodopa )  Lockout time of 3 hours was set.  The following were discussed with the patient:  - Administer in the abdomen, avoiding a 5-cm radius area from the navel. - Rotate the infusion site and use a new infusion set daily - Administer using the patient own Vyafuser  pump and consumables provided by the company - The medication vials are for single use only. - Discard the syringe and any unused med in the syringe after it has been in the syringe for 24 hours.  Time spent today:  90 min

## 2024-04-01 ENCOUNTER — Ambulatory Visit: Admitting: Internal Medicine

## 2024-04-07 ENCOUNTER — Encounter: Payer: Self-pay | Admitting: Podiatry

## 2024-04-07 ENCOUNTER — Ambulatory Visit: Admitting: Podiatry

## 2024-04-07 VITALS — Ht 62.0 in | Wt 140.0 lb

## 2024-04-07 DIAGNOSIS — B351 Tinea unguium: Secondary | ICD-10-CM | POA: Diagnosis not present

## 2024-04-07 NOTE — Progress Notes (Signed)
   Chief Complaint  Patient presents with   Callouses    Pt is here to have callous removed from the sides of both great toes.    Subjective: 58 y.o. female presenting to the office today as a new patient for evaluation of calluses to the medial border bilateral great toes that have been present for several years.  She shaves them down and applies lotion.  She also states that she has developed some discoloration with thickening to the toenails   Past Medical History:  Diagnosis Date   Burst fracture of T12 vertebra (HCC) 10/13/2018   Complication of anesthesia    Constipation    Headache    Hypercholesteremia    Parkinson's disease (HCC)     Past Surgical History:  Procedure Laterality Date   CESAREAN SECTION      Allergies  Allergen Reactions   Propofol      Objective:  Physical Exam General: Alert and oriented x3 in no acute distress  Dermatology: Calluses noted to the medial border of the bilateral great toes.  There is also some mild discoloration and dystrophy to the great toenails bilateral  Vascular: Palpable pedal pulses bilaterally. No edema or erythema noted. Capillary refill within normal limits.  Neurological: Grossly intact via light touch  Musculoskeletal Exam: Normal foot structure  Assessment: 1.  Normal calluses medial aspect of the bilateral great toes 2.  Mild onychomycosis of the toenails bilateral  Plan of Care:  -Patient evaluated -Excisional debridement of keratoic lesion(s) using a chisel blade was performed without incident.  - Formula 3 antifungal topicals dispensed at checkout.  Apply daily -Advised against going barefoot or sandals.  Recommend good supportive tennis shoes and sneakers -Return to clinic PRN  Thresa EMERSON Sar, DPM [facility]  Dr. Thresa EMERSON Sar, DPM    [address] [place], [place] [address]                Office [phone]  Fax [phone]

## 2024-04-15 ENCOUNTER — Telehealth: Payer: Self-pay | Admitting: Neurology

## 2024-04-15 NOTE — Telephone Encounter (Signed)
 Called and left voicemail message.

## 2024-04-15 NOTE — Telephone Encounter (Signed)
 Pt.s daughter is saying pump is causing bruising and it bothers her by itching and would like a call to go over the use of pump and issues

## 2024-04-29 NOTE — Progress Notes (Unsigned)
 Assessment/Plan:   1.  Parkinsons Disease  - Continue Vyalev - Base rate: 0.25 mL/h (1020 mg levodopa  per day) - Low rate: 0.23 mL/h  - High rate: 0.28 mL/h  - Extra dose:: 0.15 mL (25.5 mg levodopa ) Loading dose 1.10 mL  2.  Insomnia  -continue trazodone , 50 mg nightly.  Currently with day/night reversal.  3  LBP  -She does have moderate to severe spinal stenosis at L4-L5 and is following with orthopedics, Dr. Duwayne and Dr. Bonner, for this.  She has also seen [facility] orthopedics  4.  eye opening apraxia  -discussed botox along with r/b/se.  She was approved for xeomin  and is awaiting for pharmacy shipment    Subjective:   Marissa Garcia was seen today in follow up for Parkinsons disease.  My previous records were reviewed prior to todays visit as well as outside records available to me.   Patient with her daughter who supplements hx. patient started on [facility] November 14.  Current prescribed movement disorder medications: carbidopa /levodopa  25/100, 1 at 9:30 am when awakens before breakfast and 1 right after breakfast at 10:30 am/2 tablets at 12:30pm/1 tablet at 3-4 PM/1 at 6pm-7 PM.  May take extra prn, when going to dance, etc  Carbidopa /levodopa  50/200 CR at bedtime Trazodone , 50 mg nightly   PREVIOUS MEDICATIONS: pramipexole  (EDS); propranolol  (bradycardia and low blood pressure); rotigotine  patch (insurance denied - samples helped); escitalopram  (helped, but when she ran out she felt mood was good and did not want to restart); trazodone ; entacapone  (prescribed but she never took it); ropinirole  (taken off because of confusion)  ALLERGIES:   Allergies  Allergen Reactions   Propofol     CURRENT MEDICATIONS:  Outpatient Encounter Medications as of 04/30/2024  Medication Sig   carbidopa -levodopa  (SINEMET  CR) 50-200 MG tablet TAKE 1 TABLET BY MOUTH AT BEDTIME   carbidopa -levodopa  (SINEMET  IR) 25-100 MG tablet 2 in the AM at 9am/2 tablets at 12:30pm/1 tablet at 3-4 PM/1 at  6pm-7 PM; extra prm   ciclopirox  (PENLAC ) 8 % solution Apply topically at bedtime. Apply over nail and surrounding skin. Apply daily over previous coat. After seven (7) days, may remove with alcohol and continue cycle.   incobotulinumtoxinA  (XEOMIN ) 50 units SOLR injection Inject 50 units into the head and neck every 90 days per Dr. Evonnie   meloxicam  (MOBIC ) 15 MG tablet Take 1 tablet (15 mg total) by mouth daily.   traZODone  (DESYREL ) 50 MG tablet Take 1 tablet (50 mg total) by mouth at bedtime.   [DISCONTINUED] zolpidem  (AMBIEN ) 5 MG tablet Take 1 tablet (5 mg total) by mouth at bedtime as needed for sleep. (Patient not taking: No sig reported)   Facility-Administered Encounter Medications as of 04/30/2024  Medication   incobotulinumtoxinA  (XEOMIN ) 50 units injection 50 Units    Objective:   PHYSICAL EXAMINATION:    VITALS:   There were no vitals filed for this visit.   GEN:  The patient appears stated age and is in NAD. HEENT:  Normocephalic, atraumatic.  The mucous membranes are moist. The superficial temporal arteries are without ropiness or tenderness.  Neurological examination:  Orientation: The patient is alert and oriented x3. Cranial nerves: There is good facial symmetry with min facial hypomimia. The speech is fluent and clear (she does speak English most of the time).  Hearing is intact to conversational tone. Sensation: Sensation is intact to light touch throughout Motor: Strength is at least antigravity x4.   Movement examination: Tone: There is mild  increased tone in the right upper extremity post Vyalev initiation Abnormal movements: there is mild tremor bilateral UE and RLE prior to initiation and post initiation there is just some mild tremor on the left upper extremity Coordination:  There is mild decremation on the L Gait and Station: The patient has no difficulty arising out of a deep-seated chair without the use of the hands. The patient's stride length is good  with good but purposeful arm swing when she leaves the office today  I have reviewed and interpreted the following labs independently    Chemistry      Component Value Date/Time   NA 137 11/02/2022 0014   NA 139 03/25/2019 1553   K 3.5 11/02/2022 0014   CL 103 11/02/2022 0014   CO2 27 11/02/2022 0014   BUN 18 11/02/2022 0014   BUN 8 03/25/2019 1553   CREATININE 0.77 11/02/2022 0014   CREATININE 0.64 08/24/2015 1134      Component Value Date/Time   CALCIUM 9.4 11/02/2022 0014   ALKPHOS 56 10/13/2018 2239   AST 32 10/13/2018 2239   ALT 23 10/13/2018 2239   BILITOT 0.6 10/13/2018 2239       Lab Results  Component Value Date   WBC 7.0 11/02/2022   HGB 12.4 11/02/2022   HCT 36.5 11/02/2022   MCV 91.0 11/02/2022   PLT 260 11/02/2022    Lab Results  Component Value Date   TSH 1.060 08/25/2019     Total time spent on today's visit was *** minutes, including both face-to-face time and nonface-to-face time.  Time included that spent on review of records (prior notes available to me/labs/imaging if pertinent), discussing treatment and goals, answering patient's questions and coordinating care.  This was separate from [facility] infusion, described on separate procedural note.  Cc:  Theotis Haze ORN, NP

## 2024-04-30 ENCOUNTER — Ambulatory Visit: Admitting: Neurology

## 2024-04-30 VITALS — BP 118/76 | HR 64 | Ht 62.0 in | Wt 138.0 lb

## 2024-04-30 DIAGNOSIS — G20B2 Parkinson's disease with dyskinesia, with fluctuations: Secondary | ICD-10-CM

## 2024-05-12 ENCOUNTER — Other Ambulatory Visit: Payer: Self-pay

## 2024-05-20 NOTE — Progress Notes (Signed)
 "   Assessment/Plan:   1.  Parkinsons Disease  - Continue Vyalev - dosages changed/pump reprogrammed as she was having a lot of off time in early to late afternoon/evening and using high rate at that time as well as extra dosages  - Low rate: 0.31 mL/h  -Base: 0.34 ml/hr - High rate: 0.37 mL/h  - Extra dose:: 0.15 mL (25.5 mg levodopa ) Loading dose 1.10 mL   -handicap placard given/form filled out  -asks about dbs.  Discussed but decided to hold on pursuing for now as I think that she is doing well but she does have some tremor that probably would improve with dbs surgery  2.  Insomnia  -SE with trazodone   -continue melatonin  3  LBP  -She does have moderate to severe spinal stenosis at L4-L5 and is following with orthopedics, Dr. Duwayne and Dr. Bonner, for this.  She has also seen [facility] orthopedics  4.  eye opening apraxia/blepharospasm  - Patient has appointment upcoming for Xeomin  injection but going to cx since she d/c the trazodone  and doing better.     Subjective:   Marissa Garcia was seen today in follow up for Parkinsons disease.  My previous records were reviewed prior to todays visit as well as outside records available to me.  Patient seen last month, at which time I did increase her Vyalev rate.  She reports today that this helped.  She still has good and bad days.  She notes that when she works more and expends more energy/more tired, she might have more symptoms.  She uses extra dose 1-2 times per day.  She is using legs right now for infusion site to give abdomen a rest.  No redness/infection and doing well.     She has had no falls.  No lightheadedness or near syncope.  She notes that she is off of the trazodone  as she realized it was caused the eyelid opening issue  Current movement disorder medications: Vyalev Trazodone , 50 mg nightly  Levodopa  dosage prior to Vyalev: carbidopa /levodopa  25/100, 1 at 9:30 am when awakens before breakfast and 1 right after breakfast at  10:30 am/2 tablets at 12:30pm/1 tablet at 3-4 PM/1 at 6pm-7 PM.  May take extra prn, when going to dance, etc  Carbidopa /levodopa  50/200 CR at bedtime   PREVIOUS MEDICATIONS: pramipexole  (EDS); propranolol  (bradycardia and low blood pressure); rotigotine  patch (insurance denied - samples helped); escitalopram  (helped, but when she ran out she felt mood was good and did not want to restart); trazodone ; entacapone  (prescribed but she never took it); ropinirole  (taken off because of confusion); trazodone  (cuased hang over and eyelids to droop)  ALLERGIES:   Allergies  Allergen Reactions   Propofol     CURRENT MEDICATIONS:  Outpatient Encounter Medications as of 05/21/2024  Medication Sig   ciclopirox  (PENLAC ) 8 % solution Apply topically at bedtime. Apply over nail and surrounding skin. Apply daily over previous coat. After seven (7) days, may remove with alcohol and continue cycle.   incobotulinumtoxinA  (XEOMIN ) 50 units SOLR injection Inject 50 units into the head and neck every 90 days per Dr. Evonnie   meloxicam  (MOBIC ) 15 MG tablet Take 1 tablet (15 mg total) by mouth daily.   traZODone  (DESYREL ) 50 MG tablet Take 1 tablet (50 mg total) by mouth at bedtime.   [DISCONTINUED] carbidopa -levodopa  (SINEMET  CR) 50-200 MG tablet TAKE 1 TABLET BY MOUTH AT BEDTIME   [DISCONTINUED] carbidopa -levodopa  (SINEMET  IR) 25-100 MG tablet 2 in the AM at 9am/2  tablets at 12:30pm/1 tablet at 3-4 PM/1 at 6pm-7 PM; extra prm   [DISCONTINUED] zolpidem  (AMBIEN ) 5 MG tablet Take 1 tablet (5 mg total) by mouth at bedtime as needed for sleep. (Patient not taking: No sig reported)   Facility-Administered Encounter Medications as of 05/21/2024  Medication   incobotulinumtoxinA  (XEOMIN ) 50 units injection 50 Units    Objective:   PHYSICAL EXAMINATION:    VITALS:   Vitals:   05/21/24 0923  BP: 126/84  Pulse: 71  SpO2: 97%      GEN:  The patient appears stated age and is in NAD. HEENT:  Normocephalic,  atraumatic.  The mucous membranes are moist. The superficial temporal arteries are without ropiness or tenderness.  Neurological examination:  Orientation: The patient is alert and oriented x3. Cranial nerves: There is good facial symmetry with min facial hypomimia. The speech is fluent and clear (she does speak English most of the time).  Hearing is intact to conversational tone. Sensation: Sensation is intact to light touch throughout Motor: Strength is at least antigravity x4.   Movement examination: Tone: There is mild increased tone in the RUE/RLE but good elsewhere Abnormal movements: there is mild tremor in the bilateral LE Coordination:  There is min decremation  Gait and Station: The patient has no difficulty arising out of a deep-seated chair without the use of the hands. The patient's stride length is good   I have reviewed and interpreted the following labs independently    Chemistry      Component Value Date/Time   NA 137 11/02/2022 0014   NA 139 03/25/2019 1553   K 3.5 11/02/2022 0014   CL 103 11/02/2022 0014   CO2 27 11/02/2022 0014   BUN 18 11/02/2022 0014   BUN 8 03/25/2019 1553   CREATININE 0.77 11/02/2022 0014   CREATININE 0.64 08/24/2015 1134      Component Value Date/Time   CALCIUM 9.4 11/02/2022 0014   ALKPHOS 56 10/13/2018 2239   AST 32 10/13/2018 2239   ALT 23 10/13/2018 2239   BILITOT 0.6 10/13/2018 2239       Lab Results  Component Value Date   WBC 7.0 11/02/2022   HGB 12.4 11/02/2022   HCT 36.5 11/02/2022   MCV 91.0 11/02/2022   PLT 260 11/02/2022    Lab Results  Component Value Date   TSH 1.060 08/25/2019     Total time spent on today's visit was 40 minutes, including both face-to-face time and nonface-to-face time.  Time included that spent on review of records (prior notes available to me/labs/imaging if pertinent), discussing treatment and goals, answering patient's questions and coordinating care.  This was separate from [facility]  infusion, described on separate procedural note.  Cc:  Theotis Haze ORN, NP "

## 2024-05-21 ENCOUNTER — Other Ambulatory Visit: Payer: Self-pay

## 2024-05-21 ENCOUNTER — Ambulatory Visit: Payer: Self-pay | Admitting: Neurology

## 2024-05-21 VITALS — BP 126/84 | HR 71

## 2024-05-21 DIAGNOSIS — G4701 Insomnia due to medical condition: Secondary | ICD-10-CM

## 2024-05-21 DIAGNOSIS — G20A2 Parkinson's disease without dyskinesia, with fluctuations: Secondary | ICD-10-CM

## 2024-05-28 ENCOUNTER — Ambulatory Visit: Admitting: Neurology

## 2024-06-02 ENCOUNTER — Telehealth: Payer: Self-pay | Admitting: Neurology

## 2024-06-02 NOTE — Telephone Encounter (Signed)
 [facility] ) stated they need an updated Full  prescription with updates rates for Vyaler. Thanks

## 2024-06-02 NOTE — Telephone Encounter (Signed)
 New RX printed and ready to send

## 2024-06-04 ENCOUNTER — Ambulatory Visit: Admitting: Neurology

## 2024-06-04 ENCOUNTER — Telehealth: Payer: Self-pay | Admitting: Neurology

## 2024-06-04 NOTE — Telephone Encounter (Signed)
 Rasheeda from [employer] [facility] rates have changed and current rates with new Rx script needed to be faxed  Fax [phone]

## 2024-06-08 NOTE — Telephone Encounter (Signed)
 Rx sent.

## 2024-06-11 ENCOUNTER — Other Ambulatory Visit: Payer: Self-pay

## 2024-06-18 ENCOUNTER — Encounter: Payer: Self-pay | Admitting: Internal Medicine

## 2024-07-30 ENCOUNTER — Ambulatory Visit: Payer: Self-pay | Admitting: Neurology
